# Patient Record
Sex: Male | Born: 1955
Health system: Southern US, Community
[De-identification: ages and names within clinical notes are randomized; demographics above are authoritative.]

## PROBLEM LIST (undated history)

## (undated) DIAGNOSIS — E876 Hypokalemia: Secondary | ICD-10-CM

## (undated) DIAGNOSIS — I509 Heart failure, unspecified: Secondary | ICD-10-CM

## (undated) DIAGNOSIS — E785 Hyperlipidemia, unspecified: Secondary | ICD-10-CM

## (undated) HISTORY — DX: Hypokalemia: E87.6

## (undated) HISTORY — DX: Hyperlipidemia, unspecified: E78.5

## (undated) HISTORY — DX: Heart failure, unspecified: I50.9

---

## 2014-08-10 ENCOUNTER — Encounter: Payer: Self-pay | Admitting: Gastroenterology

## 2015-07-17 DIAGNOSIS — I493 Ventricular premature depolarization: Secondary | ICD-10-CM | POA: Insufficient documentation

## 2016-01-16 DIAGNOSIS — I420 Dilated cardiomyopathy: Secondary | ICD-10-CM | POA: Diagnosis not present

## 2016-01-16 DIAGNOSIS — E785 Hyperlipidemia, unspecified: Secondary | ICD-10-CM | POA: Diagnosis not present

## 2016-01-16 DIAGNOSIS — I2510 Atherosclerotic heart disease of native coronary artery without angina pectoris: Secondary | ICD-10-CM | POA: Diagnosis not present

## 2016-01-16 DIAGNOSIS — I5022 Chronic systolic (congestive) heart failure: Secondary | ICD-10-CM | POA: Diagnosis not present

## 2016-01-24 DIAGNOSIS — Z7901 Long term (current) use of anticoagulants: Secondary | ICD-10-CM | POA: Diagnosis not present

## 2016-01-24 DIAGNOSIS — I4892 Unspecified atrial flutter: Secondary | ICD-10-CM | POA: Diagnosis not present

## 2016-01-24 DIAGNOSIS — I5022 Chronic systolic (congestive) heart failure: Secondary | ICD-10-CM | POA: Diagnosis not present

## 2016-02-15 DIAGNOSIS — E784 Other hyperlipidemia: Secondary | ICD-10-CM | POA: Diagnosis not present

## 2016-02-15 DIAGNOSIS — Z0000 Encounter for general adult medical examination without abnormal findings: Secondary | ICD-10-CM | POA: Diagnosis not present

## 2016-02-15 DIAGNOSIS — Z125 Encounter for screening for malignant neoplasm of prostate: Secondary | ICD-10-CM | POA: Diagnosis not present

## 2016-02-15 DIAGNOSIS — E669 Obesity, unspecified: Secondary | ICD-10-CM | POA: Diagnosis not present

## 2016-02-15 DIAGNOSIS — Z6830 Body mass index (BMI) 30.0-30.9, adult: Secondary | ICD-10-CM | POA: Diagnosis not present

## 2016-02-15 DIAGNOSIS — E119 Type 2 diabetes mellitus without complications: Secondary | ICD-10-CM | POA: Diagnosis not present

## 2016-02-22 DIAGNOSIS — I2510 Atherosclerotic heart disease of native coronary artery without angina pectoris: Secondary | ICD-10-CM | POA: Diagnosis not present

## 2016-05-22 DIAGNOSIS — E663 Overweight: Secondary | ICD-10-CM | POA: Diagnosis not present

## 2016-05-22 DIAGNOSIS — E1169 Type 2 diabetes mellitus with other specified complication: Secondary | ICD-10-CM | POA: Diagnosis not present

## 2016-05-22 DIAGNOSIS — E784 Other hyperlipidemia: Secondary | ICD-10-CM | POA: Diagnosis not present

## 2016-05-22 DIAGNOSIS — Z6829 Body mass index (BMI) 29.0-29.9, adult: Secondary | ICD-10-CM | POA: Diagnosis not present

## 2016-05-23 DIAGNOSIS — M79661 Pain in right lower leg: Secondary | ICD-10-CM | POA: Diagnosis not present

## 2016-05-23 DIAGNOSIS — R7989 Other specified abnormal findings of blood chemistry: Secondary | ICD-10-CM | POA: Diagnosis not present

## 2016-05-23 DIAGNOSIS — M7121 Synovial cyst of popliteal space [Baker], right knee: Secondary | ICD-10-CM | POA: Diagnosis not present

## 2016-05-23 DIAGNOSIS — M25474 Effusion, right foot: Secondary | ICD-10-CM | POA: Diagnosis not present

## 2016-05-23 DIAGNOSIS — R609 Edema, unspecified: Secondary | ICD-10-CM | POA: Diagnosis not present

## 2016-05-23 DIAGNOSIS — R600 Localized edema: Secondary | ICD-10-CM | POA: Diagnosis not present

## 2016-05-23 DIAGNOSIS — M7731 Calcaneal spur, right foot: Secondary | ICD-10-CM | POA: Diagnosis not present

## 2016-05-23 DIAGNOSIS — M7989 Other specified soft tissue disorders: Secondary | ICD-10-CM | POA: Diagnosis not present

## 2016-05-30 DIAGNOSIS — M109 Gout, unspecified: Secondary | ICD-10-CM | POA: Diagnosis not present

## 2016-06-13 DIAGNOSIS — M109 Gout, unspecified: Secondary | ICD-10-CM | POA: Diagnosis not present

## 2016-06-13 DIAGNOSIS — Z6829 Body mass index (BMI) 29.0-29.9, adult: Secondary | ICD-10-CM | POA: Diagnosis not present

## 2016-07-18 DIAGNOSIS — M109 Gout, unspecified: Secondary | ICD-10-CM | POA: Diagnosis not present

## 2016-07-18 DIAGNOSIS — Z6829 Body mass index (BMI) 29.0-29.9, adult: Secondary | ICD-10-CM | POA: Diagnosis not present

## 2016-11-27 DIAGNOSIS — I509 Heart failure, unspecified: Secondary | ICD-10-CM | POA: Diagnosis not present

## 2016-11-27 DIAGNOSIS — I2510 Atherosclerotic heart disease of native coronary artery without angina pectoris: Secondary | ICD-10-CM | POA: Diagnosis not present

## 2016-12-06 DIAGNOSIS — I25110 Atherosclerotic heart disease of native coronary artery with unstable angina pectoris: Secondary | ICD-10-CM | POA: Diagnosis not present

## 2016-12-06 DIAGNOSIS — I2510 Atherosclerotic heart disease of native coronary artery without angina pectoris: Secondary | ICD-10-CM | POA: Diagnosis not present

## 2016-12-06 DIAGNOSIS — E785 Hyperlipidemia, unspecified: Secondary | ICD-10-CM | POA: Diagnosis not present

## 2016-12-06 DIAGNOSIS — I5022 Chronic systolic (congestive) heart failure: Secondary | ICD-10-CM | POA: Diagnosis not present

## 2017-01-09 DIAGNOSIS — E119 Type 2 diabetes mellitus without complications: Secondary | ICD-10-CM | POA: Diagnosis not present

## 2017-01-09 DIAGNOSIS — J019 Acute sinusitis, unspecified: Secondary | ICD-10-CM | POA: Diagnosis not present

## 2017-01-09 DIAGNOSIS — E784 Other hyperlipidemia: Secondary | ICD-10-CM | POA: Diagnosis not present

## 2017-01-09 DIAGNOSIS — I509 Heart failure, unspecified: Secondary | ICD-10-CM | POA: Diagnosis not present

## 2017-01-09 DIAGNOSIS — I2510 Atherosclerotic heart disease of native coronary artery without angina pectoris: Secondary | ICD-10-CM | POA: Diagnosis not present

## 2017-01-09 DIAGNOSIS — E039 Hypothyroidism, unspecified: Secondary | ICD-10-CM | POA: Diagnosis not present

## 2017-01-09 DIAGNOSIS — M109 Gout, unspecified: Secondary | ICD-10-CM | POA: Diagnosis not present

## 2017-01-14 DIAGNOSIS — E876 Hypokalemia: Secondary | ICD-10-CM | POA: Diagnosis not present

## 2017-01-16 DIAGNOSIS — M79605 Pain in left leg: Secondary | ICD-10-CM | POA: Diagnosis not present

## 2017-01-16 DIAGNOSIS — M6281 Muscle weakness (generalized): Secondary | ICD-10-CM | POA: Diagnosis not present

## 2017-01-16 DIAGNOSIS — M545 Low back pain: Secondary | ICD-10-CM | POA: Diagnosis not present

## 2017-01-16 DIAGNOSIS — R2689 Other abnormalities of gait and mobility: Secondary | ICD-10-CM | POA: Diagnosis not present

## 2017-01-18 DIAGNOSIS — M79605 Pain in left leg: Secondary | ICD-10-CM | POA: Diagnosis not present

## 2017-01-18 DIAGNOSIS — M6281 Muscle weakness (generalized): Secondary | ICD-10-CM | POA: Diagnosis not present

## 2017-01-18 DIAGNOSIS — M545 Low back pain: Secondary | ICD-10-CM | POA: Diagnosis not present

## 2017-01-18 DIAGNOSIS — R2689 Other abnormalities of gait and mobility: Secondary | ICD-10-CM | POA: Diagnosis not present

## 2017-01-21 DIAGNOSIS — M6281 Muscle weakness (generalized): Secondary | ICD-10-CM | POA: Diagnosis not present

## 2017-01-21 DIAGNOSIS — R2689 Other abnormalities of gait and mobility: Secondary | ICD-10-CM | POA: Diagnosis not present

## 2017-01-21 DIAGNOSIS — M545 Low back pain: Secondary | ICD-10-CM | POA: Diagnosis not present

## 2017-01-21 DIAGNOSIS — M79605 Pain in left leg: Secondary | ICD-10-CM | POA: Diagnosis not present

## 2017-02-04 DIAGNOSIS — H5213 Myopia, bilateral: Secondary | ICD-10-CM | POA: Diagnosis not present

## 2017-02-04 DIAGNOSIS — E113313 Type 2 diabetes mellitus with moderate nonproliferative diabetic retinopathy with macular edema, bilateral: Secondary | ICD-10-CM | POA: Diagnosis not present

## 2017-02-28 DIAGNOSIS — E113493 Type 2 diabetes mellitus with severe nonproliferative diabetic retinopathy without macular edema, bilateral: Secondary | ICD-10-CM | POA: Diagnosis not present

## 2017-03-27 ENCOUNTER — Encounter: Payer: Self-pay | Admitting: Cardiology

## 2017-03-27 DIAGNOSIS — E785 Hyperlipidemia, unspecified: Secondary | ICD-10-CM | POA: Insufficient documentation

## 2017-03-27 DIAGNOSIS — I2510 Atherosclerotic heart disease of native coronary artery without angina pectoris: Secondary | ICD-10-CM | POA: Insufficient documentation

## 2017-03-27 DIAGNOSIS — I255 Ischemic cardiomyopathy: Secondary | ICD-10-CM | POA: Insufficient documentation

## 2017-03-27 DIAGNOSIS — I5022 Chronic systolic (congestive) heart failure: Secondary | ICD-10-CM | POA: Insufficient documentation

## 2017-03-27 DIAGNOSIS — I420 Dilated cardiomyopathy: Secondary | ICD-10-CM | POA: Insufficient documentation

## 2017-03-27 DIAGNOSIS — E119 Type 2 diabetes mellitus without complications: Secondary | ICD-10-CM | POA: Insufficient documentation

## 2017-03-29 ENCOUNTER — Ambulatory Visit (INDEPENDENT_AMBULATORY_CARE_PROVIDER_SITE_OTHER): Payer: BLUE CROSS/BLUE SHIELD | Admitting: Cardiology

## 2017-03-29 ENCOUNTER — Encounter: Payer: Self-pay | Admitting: Cardiology

## 2017-03-29 VITALS — BP 96/68 | HR 93 | Wt 204.8 lb

## 2017-03-29 DIAGNOSIS — I255 Ischemic cardiomyopathy: Secondary | ICD-10-CM

## 2017-03-29 DIAGNOSIS — I420 Dilated cardiomyopathy: Secondary | ICD-10-CM

## 2017-03-29 DIAGNOSIS — I5022 Chronic systolic (congestive) heart failure: Secondary | ICD-10-CM

## 2017-03-29 MED ORDER — NITROGLYCERIN 0.4 MG SL SUBL: 0.4000 mg | SUBLINGUAL_TABLET | SUBLINGUAL | 2 refills | Status: DC | PRN

## 2017-03-29 MED ORDER — PRAVASTATIN SODIUM 40 MG PO TABS: 40.0000 mg | ORAL_TABLET | Freq: Every day | ORAL | 11 refills | Status: DC

## 2017-03-29 MED ORDER — METOLAZONE 5 MG PO TABS: 5.0000 mg | ORAL_TABLET | Freq: Every day | ORAL | 5 refills | Status: DC

## 2017-03-29 NOTE — Progress Notes (Signed)
Cardiology Office Note:    Date:  03/29/2017   ID:  Cody Schwartz, DOB July 22, 1956, MRN [id number]  PCP:  [name]., MD  Cardiologist:  Shirlee More, MD    Referring MD: [name]., MD    ASSESSMENT:    1. Chronic systolic heart failure ([place])   2. Dilated cardiomyopathy ([place])   3. Ischemic cardiomyopathy    PLAN:    In order of problems listed above:  1. Markedly deteriorated he has anasarca with predominant right heart failure. On his own is increased his torsemide to twice daily and will increase his Zaroxolyn to 5 days per week. He said previously her hypokalemia and renal function reassessed today. I offered him IV diuretic and hospitalization he declines. His blood pressure is decreased he's been intolerant of beta blocker Ace R and will continue his distal diuretic and Ivabradine. Again we discussed the refractory nature of his heart failure advised him referral for advanced heart failure mortalities and he said he will consider reviewed me an answer at the next visit. He has refused EP consultation for ICD. 2. See above heart failure is worsened hemodynamics or worsen intolerant of conventional medications and I feel he should be referred for advanced heart failure modality 3. Continue dual and a platelet and statin for function lipids will be checked  Next appointment: One month   Medication Adjustments/Labs and Tests Ordered: Current medicines are reviewed at length with the patient today.  Concerns regarding medicines are outlined above.  No orders of the defined types were placed in this encounter.  No orders of the defined types were placed in this encounter.   Chief Complaint  Patient presents with  . Follow-up    Routine flup appt, pt does mention weight gain, and feeling "heaviness" in his abdomen    History of Present Illness:    Cody Schwartz is a 61 y.o. male with a hx of CAD with severe cardiomyopathy EF 20-25% 12/20/15, CHF, S/P PCI and DES of  LAD and POBA of D on 01/08/2012 and CTO of LAD withSuccessful PCI / Xience Drug Eluting Stent of the mid Right Coronary Artery on 10/12/15.He is intolerant of carvedilol and ACEI with weakness and hypotension, last seen March 2018. He is beginning to be displeased with quality of his life. He still works full-time as a Company secretary stroke  he finds it difficult to walk and is terribly bothered by his abdominal distention. He had no chest pain orthopnea palpitations syncope or TIA Compliance with diet, lifestyle and medications: Yes Past Medical History:  Diagnosis Date  . Hyperlipidemia   . Hypokalemia     History reviewed. No pertinent surgical history.  Current Medications: Current Meds  Medication Sig  . clopidogrel (PLAVIX) 75 MG tablet Take 75 mg by mouth daily.  [name] eplerenone (INSPRA) 25 MG tablet Take 25 mg by mouth daily.  . ivabradine (CORLANOR) 7.5 MG TABS tablet Take 7.5 mg by mouth 2 (two) times daily.  [name] levothyroxine (SYNTHROID, LEVOTHROID) 75 MCG tablet Take 75 mcg by mouth daily.  . metolazone (ZAROXOLYN) 5 MG tablet Take 5 mg by mouth daily. Takes one tablet on Tues, Thurs, Sat  . torsemide (DEMADEX) 20 MG tablet Take 20 mg by mouth 2 (two) times daily.     Allergies:   Patient has no known allergies.   Social History   Social History  . Marital status: Married    Spouse name: N/A  . Number of children: N/A  . Years  of education: N/A   Social History Main Topics  . Smoking status: Never Smoker  . Smokeless tobacco: Never Used  . Alcohol use None  . Drug use: Unknown  . Sexual activity: Not Asked   Other Topics Concern  . None   Social History Narrative  . None     Family History: The patient's family history includes CAD in his paternal grandfather and paternal grandmother; Stroke in his paternal grandfather. ROS:   Please see the history of present illness.    All other systems reviewed and are negative.  EKGs/Labs/Other Studies Reviewed:    The  following studies were reviewed today:   Recent Labs: Na 125, K 4.8 Cr 1.23 01/14/17 No results found for requested labs within last 8760 hours.  Recent Lipid Panel No results found for: CHOL, TRIG, HDL, CHOLHDL, VLDL, LDLCALC, LDLDIRECT  Physical Exam:    VS:  BP 96/68   Pulse 93   Wt 204 lb 12.8 oz (92.9 kg)   SpO2 100%     Wt Readings from Last 3 Encounters:  03/29/17 204 lb 12.8 oz (92.9 kg)     GEN: He looks chronically ill with muscle wasting and marked neck vein distention to the angle of the jaw sitting at 90 and a distended abdomen signs in no acute distress HEENT: Normal NECK: ; No carotid bruits LYMPHATICS: No lymphadenopathy CARDIAC:RRR, S3 is present rubs, gallops RESPIRATORY:  Clear to auscultation without rales, wheezing or rhonchi  ABDOMEN: Soft, non-tender, distended with hepatojugular reflux MUSCULOSKELETAL: He has anasarca above the umbilicus No deformity  SKIN: Warm and dry NEUROLOGIC:  Alert and oriented x 3 PSYCHIATRIC:  Normal affect    Signed, Shirlee More, MD  03/29/2017 11:57 AM    [place]

## 2017-03-29 NOTE — Patient Instructions (Addendum)
Medication Instructions:  Your physician recommends that you continue on your current medications as directed. Please refer to the Current Medication list given to you today.   Labwork: Your physician recommends that you return for lab work in: today. CMP, BNP, lipid   Testing/Procedures: None  Follow-Up: Your physician recommends that you schedule a follow-up appointment in: 1 month.   Any Other Special Instructions Will Be Listed Below (If Applicable).     If you need a refill on your cardiac medications before your next appointment, please call your pharmacy.    Heart Failure Heart failure is a condition in which the heart has trouble pumping blood because it has become weak or stiff. This means that the heart does not pump blood efficiently for the body to work well. For some people with heart failure, fluid may back up into the lungs and there may be swelling (edema) in the lower legs. Heart failure is usually a long-term (chronic) condition. It is important for you to take good care of yourself and follow the treatment plan from your health care provider. What are the causes? This condition is caused by some health problems, including:  High blood pressure (hypertension). Hypertension causes the heart muscle to work harder than normal. High blood pressure eventually causes the heart to become stiff and weak.  Coronary artery disease (CAD). CAD is the buildup of cholesterol and fat (plaques) in the arteries of the heart.  Heart attack (myocardial infarction). Injured tissue, which is caused by the heart attack, does not contract as well and the heart's ability to pump blood is weakened.  Abnormal heart valves. When the heart valves do not open and close properly, the heart muscle must pump harder to keep the blood flowing.  Heart muscle disease (cardiomyopathy or myocarditis). Heart muscle disease is damage to the heart muscle from a variety of causes, such as drug or  alcohol abuse, infections, or unknown causes. These can increase the risk of heart failure.  Lung disease. When the lungs do not work properly, the heart must work harder.  What increases the risk? Risk of heart failure increases as a person ages. This condition is also more likely to develop in people who:  Are overweight.  Are male.  Smoke or chew tobacco.  Abuse alcohol or illegal drugs.  Have taken medicines that can damage the heart, such as chemotherapy drugs.  Have diabetes. ? High blood sugar (glucose) is associated with high fat (lipid) levels in the blood. ? Diabetes can also damage tiny blood vessels that carry nutrients to the heart muscle.  Have abnormal heart rhythms.  Have thyroid problems.  Have low blood counts (anemia).  What are the signs or symptoms? Symptoms of this condition include:  Shortness of breath with activity, such as when climbing stairs.  Persistent cough.  Swelling of the feet, ankles, legs, or abdomen.  Unexplained weight gain.  Difficulty breathing when lying flat (orthopnea).  Waking from sleep because of the need to sit up and get more air.  Rapid heartbeat.  Fatigue and loss of energy.  Feeling light-headed, dizzy, or close to fainting.  Loss of appetite.  Nausea.  Increased urination during the night (nocturia).  Confusion.  How is this diagnosed? This condition is diagnosed based on:  Medical history, symptoms, and a physical exam.  Diagnostic tests, which may include: ? Echocardiogram. ? Electrocardiogram (ECG). ? Chest X-ray. ? Blood tests. ? Exercise stress test. ? Radionuclide scans. ? Cardiac catheterization and angiogram.  How is this treated? Treatment for this condition is aimed at managing the symptoms of heart failure. Medicines, behavioral changes, or other treatments may be necessary to treat heart failure. Medicines These may include:  Angiotensin-converting enzyme (ACE) inhibitors. This  type of medicine blocks the effects of a blood protein called angiotensin-converting enzyme. ACE inhibitors relax (dilate) the blood vessels and help to lower blood pressure.  Angiotensin receptor blockers (ARBs). This type of medicine blocks the actions of a blood protein called angiotensin. ARBs dilate the blood vessels and help to lower blood pressure.  Water pills (diuretics). Diuretics cause the kidneys to remove salt and water from the blood. The extra fluid is removed through urination, leaving a lower volume of blood that the heart has to pump.  Beta blockers. These improve heart muscle strength and they prevent the heart from beating too quickly.  Digoxin. This increases the force of the heartbeat.  Healthy behavior changes These may include:  Reaching and maintaining a healthy weight.  Stopping smoking or chewing tobacco.  Eating heart-healthy foods.  Limiting or avoiding alcohol.  Stopping use of street drugs (illegal drugs).  Physical activity.  Other treatments These may include:  Surgery to open blocked coronary arteries or repair damaged heart valves.  Placement of a biventricular pacemaker to improve heart muscle function (cardiac resynchronization therapy). This device paces both the right ventricle and left ventricle.  Placement of a device to treat serious abnormal heart rhythms (implantable cardioverter defibrillator, or ICD).  Placement of a device to improve the pumping ability of the heart (left ventricular assist device, or LVAD).  Heart transplant. This can cure heart failure, and it is considered for certain patients who do not improve with other therapies.  Follow these instructions at home: Medicines  Take over-the-counter and prescription medicines only as told by your health care provider. Medicines are important in reducing the workload of your heart, slowing the progression of heart failure, and improving your symptoms. ? Do not stop taking  your medicine unless your health care provider told you to do that. ? Do not skip any dose of medicine. ? Refill your prescriptions before you run out of medicine. You need your medicines every day. Eating and drinking   Eat heart-healthy foods. Talk with a dietitian to make an eating plan that is right for you. ? Choose foods that contain no trans fat and are low in saturated fat and cholesterol. Healthy choices include fresh or frozen fruits and vegetables, fish, lean meats, legumes, fat-free or low-fat dairy products, and whole-grain or high-fiber foods. ? Limit salt (sodium) if directed by your health care provider. Sodium restriction may reduce symptoms of heart failure. Ask a dietitian to recommend heart-healthy seasonings. ? Use healthy cooking methods instead of frying. Healthy methods include roasting, grilling, broiling, baking, poaching, steaming, and stir-frying.  Limit your fluid intake if directed by your health care provider. Fluid restriction may reduce symptoms of heart failure. Lifestyle  Stop smoking or using chewing tobacco. Nicotine and tobacco can damage your heart and your blood vessels. Do not use nicotine gum or patches before talking to your health care provider.  Limit alcohol intake to no more than 1 drink per day for non-pregnant women and 2 drinks per day for men. One drink equals 12 oz of beer, 5 oz of wine, or 1 oz of hard liquor. ? Drinking more than that is harmful to your heart. Tell your health care provider if you drink alcohol several times a week. ?  Talk with your health care provider about whether any level of alcohol use is safe for you. ? If your heart has already been damaged by alcohol or you have severe heart failure, drinking alcohol should be stopped completely.  Stop use of illegal drugs.  Lose weight if directed by your health care provider. Weight loss may reduce symptoms of heart failure.  Do moderate physical activity if directed by your  health care provider. People who are elderly and people with severe heart failure should consult with a health care provider for physical activity recommendations. Monitor important information  Weigh yourself every day. Keeping track of your weight daily helps you to notice excess fluid sooner. ? Weigh yourself every morning after you urinate and before you eat breakfast. ? Wear the same amount of clothing each time you weigh yourself. ? Record your daily weight. Provide your health care provider with your weight record.  Monitor and record your blood pressure as told by your health care provider.  Check your pulse as told by your health care provider. Dealing with extreme temperatures  If the weather is extremely hot: ? Avoid vigorous physical activity. ? Use air conditioning or fans or seek a cooler location. ? Avoid caffeine and alcohol. ? Wear loose-fitting, lightweight, and light-colored clothing.  If the weather is extremely cold: ? Avoid vigorous physical activity. ? Layer your clothes. ? Wear mittens or gloves, a hat, and a scarf when you go outside. ? Avoid alcohol. General instructions  Manage other health conditions such as hypertension, diabetes, thyroid disease, or abnormal heart rhythms as told by your health care provider.  Learn to manage stress. If you need help to do this, ask your health care provider.  Plan rest periods when fatigued.  Get ongoing education and support as needed.  Participate in or seek rehabilitation as needed to maintain or improve independence and quality of life.  Stay up to date with immunizations. Keeping current on pneumococcal and influenza immunizations is especially important to prevent respiratory infections.  Keep all follow-up visits as told by your health care provider. This is important. Contact a health care provider if:  You have a rapid weight gain.  You have increasing shortness of breath that is unusual for  you.  You are unable to participate in your usual physical activities.  You tire easily.  You cough more than normal, especially with physical activity.  You have any swelling or more swelling in areas such as your hands, feet, ankles, or abdomen.  You are unable to sleep because it is hard to breathe.  You feel like your heart is beating quickly (palpitations).  You become dizzy or light-headed when you stand up. Get help right away if:  You have difficulty breathing.  You notice or your family notices a change in your awareness, such as having trouble staying awake or having difficulty with concentration.  You have pain or discomfort in your chest.  You have an episode of fainting (syncope). This information is not intended to replace advice given to you by your health care provider. Make sure you discuss any questions you have with your health care provider. Document Released: 09/10/2005 Document Revised: 05/15/2016 Document Reviewed: 04/04/2016 Elsevier Interactive Patient Education  2017 [employer].

## 2017-03-30 LAB — COMPREHENSIVE METABOLIC PANEL
ALBUMIN: 4.4 g/dL (ref 3.6–4.8)
ALT: 9 IU/L (ref 0–44)
AST: 21 IU/L (ref 0–40)
Albumin/Globulin Ratio: 1.4 (ref 1.2–2.2)
Alkaline Phosphatase: 170 IU/L — ABNORMAL HIGH (ref 39–117)
BUN/Creatinine Ratio: 40 — ABNORMAL HIGH (ref 10–24)
BUN: 51 mg/dL — ABNORMAL HIGH (ref 8–27)
Bilirubin Total: 1 mg/dL (ref 0.0–1.2)
CALCIUM: 9.4 mg/dL (ref 8.6–10.2)
CO2: 22 mmol/L (ref 20–29)
Chloride: 94 mmol/L — ABNORMAL LOW (ref 96–106)
Creatinine, Ser: 1.26 mg/dL (ref 0.76–1.27)
GFR calc Af Amer: 70 mL/min/{1.73_m2} (ref >59)
GFR calc non Af Amer: 60 mL/min/{1.73_m2} (ref >59)
GLOBULIN, TOTAL: 3.1 g/dL (ref 1.5–4.5)
Glucose: 125 mg/dL — ABNORMAL HIGH (ref 65–99)
Potassium: 3.6 mmol/L (ref 3.5–5.2)
SODIUM: 136 mmol/L (ref 134–144)
Total Protein: 7.5 g/dL (ref 6.0–8.5)

## 2017-03-30 LAB — LIPID PANEL
CHOL/HDL RATIO: 2.5 ratio (ref 0.0–5.0)
Cholesterol, Total: 118 mg/dL (ref 100–199)
HDL: 47 mg/dL (ref >39)
LDL CALC: 58 mg/dL (ref 0–99)
TRIGLYCERIDES: 63 mg/dL (ref 0–149)
VLDL Cholesterol Cal: 13 mg/dL (ref 5–40)

## 2017-04-03 LAB — BRAIN NATRIURETIC PEPTIDE

## 2017-04-10 DIAGNOSIS — E784 Other hyperlipidemia: Secondary | ICD-10-CM | POA: Diagnosis not present

## 2017-04-10 DIAGNOSIS — E039 Hypothyroidism, unspecified: Secondary | ICD-10-CM | POA: Diagnosis not present

## 2017-04-10 DIAGNOSIS — R140 Abdominal distension (gaseous): Secondary | ICD-10-CM | POA: Diagnosis not present

## 2017-04-10 DIAGNOSIS — E139 Other specified diabetes mellitus without complications: Secondary | ICD-10-CM | POA: Diagnosis not present

## 2017-04-10 DIAGNOSIS — I509 Heart failure, unspecified: Secondary | ICD-10-CM | POA: Diagnosis not present

## 2017-04-10 DIAGNOSIS — J019 Acute sinusitis, unspecified: Secondary | ICD-10-CM | POA: Diagnosis not present

## 2017-04-18 DIAGNOSIS — K8020 Calculus of gallbladder without cholecystitis without obstruction: Secondary | ICD-10-CM | POA: Diagnosis not present

## 2017-04-18 DIAGNOSIS — R188 Other ascites: Secondary | ICD-10-CM | POA: Diagnosis not present

## 2017-04-18 DIAGNOSIS — K7689 Other specified diseases of liver: Secondary | ICD-10-CM | POA: Diagnosis not present

## 2017-04-29 DIAGNOSIS — R188 Other ascites: Secondary | ICD-10-CM | POA: Diagnosis not present

## 2017-05-02 ENCOUNTER — Ambulatory Visit (INDEPENDENT_AMBULATORY_CARE_PROVIDER_SITE_OTHER): Payer: BLUE CROSS/BLUE SHIELD | Admitting: Cardiology

## 2017-05-02 ENCOUNTER — Encounter: Payer: Self-pay | Admitting: Cardiology

## 2017-05-02 VITALS — BP 74/48 | HR 79 | Wt 182.4 lb

## 2017-05-02 DIAGNOSIS — I5022 Chronic systolic (congestive) heart failure: Secondary | ICD-10-CM | POA: Diagnosis not present

## 2017-05-02 DIAGNOSIS — R601 Generalized edema: Secondary | ICD-10-CM

## 2017-05-02 NOTE — Patient Instructions (Addendum)
Medication Instructions:  Your physician has recommended you make the following change in your medication:  CHANGE lisinopril 2.5 mg three times weekly. Monday, Wednesday, Friday.   Labwork: None  Testing/Procedures: None  Follow-Up: Your physician recommends that you schedule a follow-up appointment in: 3 months.   Any Other Special Instructions Will Be Listed Below (If Applicable).     If you need a refill on your cardiac medications before your next appointment, please call your pharmacy.    Heart Failure  Weigh yourself every morning when you first wake up and record on a calender or note pad, bring this to your office visits. Using a pill tender can help with taking your medications consistently.  Limit your fluid intake to 2 liters daily  Limit your sodium intake to less than 2-3 grams daily. Ask if you need dietary teaching.  If you gain more than 3 pounds (from your dry weight ), double your dose of diuretic for the day.  If you gain more than 5 pounds (from your dry weight), double your dose of lasix and call your heart failure doctor.  Please do not smoke tobacco since it is very bad for your heart.  Please do not drink alcohol since it can worsen your heart failure.Also avoid OTC nonsteroidal drugs, such as advil, aleve and motrin.  Try to exercise for at least 30 minutes every day because this will help your heart be more efficient. You may be eligible for supervised cardiac rehab, ask your physician.

## 2017-05-02 NOTE — Progress Notes (Signed)
Cardiology Office Note:    Date:  05/02/2017   ID:  Cody Schwartz, DOB 1956-05-04, MRN [id number]  PCP:  [name]., MD Please do a BMP monthly Cardiologist:  Shirlee More, MD    Referring MD: [name]., MD    ASSESSMENT:    1. Chronic systolic heart failure ([facility])    PLAN:    In order of problems listed above:  1. He is improved but remains fluid overloaded will continue his intense combination diuretic regimen with metolazone torsemide and Inspra. He is intolerant of beta blockers continue his current trauma therapy with ivabradine and a low-dose blood pressure is low at target keep him on a minimum dose of ACE inhibitor recently started by his PCP. I advised him to have monthly BMP done and again asked him to consider referral for advanced heart failure mortalities or ICD. 2. CAD stable continue dual antiplatelet therapy and his statin   Next appointment: 3 months   Medication Adjustments/Labs and Tests Ordered: Current medicines are reviewed at length with the patient today.  Concerns regarding medicines are outlined above.  No orders of the defined types were placed in this encounter.  No orders of the defined types were placed in this encounter.   Chief Complaint  Patient presents with  . Follow-up    1 month flup     History of Present Illness:    Cody Schwartz is a 61 y.o. male with a hx of severe cardiomyopathy EF 20-25% 12/20/15, CHF, S/P PCI and DES of LAD and POBA of D on 01/08/2012 and CTO of LAD withSuccessful PCI / Xience Drug Eluting Stent of the mid Right Coronary Artery on 10/12/15.He was intolerant of carvedilol and ACEI with weakness and hypotension, and stopped spironolactone because of urticaria. He is advised and declined an ICD. He was last seen 1 month ago.  He feels markedly improved with combination of intense diuretic therapy and a therapeutic paracentesis he is now down 34 pounds. Home blood pressure runs 0539 systolic he is does not  feel lightheaded no shortness of breath chest pain palpitation or syncope and he has a mild degree of peripheral edema. He decided not to pursue advanced heart failure consultation and again declines consideration of ICD. He relates lab work 1 week ago with his PCP and I've asked for a copy and advised him to have a BMP done monthly. He had a paracentesis and weight is now down 34 lbs. Compliance with diet, lifestyle and medications: Yes Past Medical History:  Diagnosis Date  . Hyperlipidemia   . Hypokalemia     History reviewed. No pertinent surgical history.  Current Medications: Current Meds  Medication Sig  . allopurinol (ZYLOPRIM) 100 MG tablet Take 100 mg by mouth daily.  [name] allopurinol (ZYLOPRIM) 300 MG tablet Take 300 mg by mouth daily.  [name] aspirin EC 81 MG tablet Take 81 mg by mouth daily.  . clopidogrel (PLAVIX) 75 MG tablet Take 75 mg by mouth daily.  [name] eplerenone (INSPRA) 25 MG tablet Take 25 mg by mouth daily.  . ivabradine (CORLANOR) 7.5 MG TABS tablet Take 7.5 mg by mouth 2 (two) times daily.  [name] levothyroxine (SYNTHROID, LEVOTHROID) 88 MCG tablet Take 88 mcg by mouth daily before breakfast.  . lisinopril (PRINIVIL,ZESTRIL) 5 MG tablet Take 2.5 mg by mouth 3 (three) times a week. On Mon Weds Fri  . metolazone (ZAROXOLYN) 5 MG tablet Take 1 tablet (5 mg total) by mouth daily. Except Weds and Sun  .  nitroGLYCERIN (NITROSTAT) 0.4 MG SL tablet Place 1 tablet (0.4 mg total) under the tongue every 5 (five) minutes as needed for chest pain.  [name] Potassium Chloride 40 MEQ/15ML (20%) SOLN DILLUTE 15 MILLILITERS IN FOUR OUNCES OF COLD WATER THREE TIMES DAILY  . pravastatin (PRAVACHOL) 40 MG tablet Take 1 tablet (40 mg total) by mouth daily.  [name] torsemide (DEMADEX) 20 MG tablet Take 20 mg by mouth 2 (two) times daily.     Allergies:   Patient has no known allergies.   Social History   Social History  . Marital status: Married    Spouse name: N/A  . Number of children: N/A  . Years  of education: N/A   Social History Main Topics  . Smoking status: Never Smoker  . Smokeless tobacco: Never Used  . Alcohol use None  . Drug use: Unknown  . Sexual activity: Not Asked   Other Topics Concern  . None   Social History Narrative  . None     Family History: The patient's family history includes CAD in his paternal grandfather and paternal grandmother; Stroke in his paternal grandfather. ROS:   Please see the history of present illness.    All other systems reviewed and are negative.  EKGs/Labs/Other Studies Reviewed:    The following studies were reviewed today:  Recent Labs: 03/29/2017: ALT 9; BNP CANCELED; BUN 51; Creatinine, Ser 1.26; Potassium 3.6; Sodium 136  Recent Lipid Panel    Component Value Date/Time   CHOL 118 03/29/2017 1231   TRIG 63 03/29/2017 1231   HDL 47 03/29/2017 1231   CHOLHDL 2.5 03/29/2017 1231   LDLCALC 58 03/29/2017 1231    Physical Exam:    VS:  BP (!) 74/48 (BP Location: Right Arm, Patient Position: Sitting)   Pulse 79   Wt 182 lb 6.4 oz (82.7 kg)   SpO2 98%     Wt Readings from Last 3 Encounters:  05/02/17 182 lb 6.4 oz (82.7 kg)  03/29/17 204 lb 12.8 oz (92.9 kg)     GEN:  Well nourished, well developed in no acute distress HEENT: Normal NECK: Mild JVD; No carotid bruits LYMPHATICS: No lymphadenopathy CARDIAC 2-3+ edemaRRR, 3/6 TR murmur, rubs, gallops RESPIRATORY:  Clear to auscultation without rales, wheezing or rhonchi  ABDOMEN: Soft, non-tender, non-distended MUSCULOSKELETAL:  No edema; No deformity  SKIN: Warm and dry NEUROLOGIC:  Alert and oriented x 3 PSYCHIATRIC:  Normal affect    Signed, Shirlee More, MD  05/02/2017 11:25 AM     [facility]

## 2017-05-03 ENCOUNTER — Other Ambulatory Visit: Payer: Self-pay

## 2017-05-03 MED ORDER — TORSEMIDE 20 MG PO TABS: 20.0000 mg | ORAL_TABLET | Freq: Two times a day (BID) | ORAL | 11 refills | Status: DC

## 2017-05-06 ENCOUNTER — Other Ambulatory Visit: Payer: Self-pay

## 2017-05-06 MED ORDER — POTASSIUM CHLORIDE 40 MEQ/15ML (20%) PO SOLN: 15.0000 mL | Freq: Three times a day (TID) | ORAL | 1 refills | Status: DC

## 2017-06-20 ENCOUNTER — Telehealth: Payer: Self-pay | Admitting: Cardiology

## 2017-06-20 ENCOUNTER — Other Ambulatory Visit: Payer: Self-pay

## 2017-06-20 MED ORDER — POTASSIUM CHLORIDE 40 MEQ/15ML (20%) PO SOLN: 15.0000 mL | Freq: Three times a day (TID) | ORAL | 3 refills | Status: DC

## 2017-06-20 NOTE — Telephone Encounter (Signed)
Spoke with pt and Rx was sent to [facility] per pts request.  Pt verbalized understanding.

## 2017-06-20 NOTE — Telephone Encounter (Signed)
Patient is taking liquid potassium and he needs some called to [facility].. Please call patient

## 2017-08-01 DIAGNOSIS — E785 Hyperlipidemia, unspecified: Secondary | ICD-10-CM | POA: Diagnosis not present

## 2017-08-01 DIAGNOSIS — E039 Hypothyroidism, unspecified: Secondary | ICD-10-CM | POA: Diagnosis not present

## 2017-08-01 DIAGNOSIS — E119 Type 2 diabetes mellitus without complications: Secondary | ICD-10-CM | POA: Diagnosis not present

## 2017-08-01 DIAGNOSIS — R188 Other ascites: Secondary | ICD-10-CM | POA: Diagnosis not present

## 2017-08-01 DIAGNOSIS — I25119 Atherosclerotic heart disease of native coronary artery with unspecified angina pectoris: Secondary | ICD-10-CM | POA: Diagnosis not present

## 2017-08-01 DIAGNOSIS — M109 Gout, unspecified: Secondary | ICD-10-CM | POA: Diagnosis not present

## 2017-08-02 ENCOUNTER — Encounter: Payer: Self-pay | Admitting: Cardiology

## 2017-08-02 ENCOUNTER — Ambulatory Visit: Payer: BLUE CROSS/BLUE SHIELD | Admitting: Cardiology

## 2017-08-02 VITALS — BP 90/78 | HR 98 | Ht 68.0 in | Wt 209.0 lb

## 2017-08-02 DIAGNOSIS — I493 Ventricular premature depolarization: Secondary | ICD-10-CM

## 2017-08-02 DIAGNOSIS — I5022 Chronic systolic (congestive) heart failure: Secondary | ICD-10-CM | POA: Diagnosis not present

## 2017-08-02 DIAGNOSIS — E785 Hyperlipidemia, unspecified: Secondary | ICD-10-CM

## 2017-08-02 DIAGNOSIS — I2510 Atherosclerotic heart disease of native coronary artery without angina pectoris: Secondary | ICD-10-CM | POA: Diagnosis not present

## 2017-08-02 MED ORDER — EPLERENONE 25 MG PO TABS: 25.0000 mg | ORAL_TABLET | Freq: Every day | ORAL | 2 refills | Status: DC

## 2017-08-02 NOTE — Patient Instructions (Signed)
Medication Instructions:  Your physician has recommended you make the following change in your medication:  RESTART inspira 25 mg daily  Labwork: None  Testing/Procedures: None  Follow-Up: Your physician recommends that you schedule a follow-up appointment in: 1 month.  You will receive a phone call to schedule with the heart failure clinic.  Any Other Special Instructions Will Be Listed Below (If Applicable).     If you need a refill on your cardiac medications before your next appointment, please call your pharmacy.

## 2017-08-02 NOTE — Progress Notes (Signed)
Cardiology Office Note:    Date:  08/02/2017   ID:  Cody Schwartz, DOB 11/15/1955, MRN [id number]  PCP:  [name]., MD  Cardiologist:  Shirlee More, MD    Referring MD: [name]., MD    ASSESSMENT:    1. Chronic systolic heart failure ([place])   2. Coronary artery disease involving native coronary artery of native heart without angina pectoris   3. PVC (premature ventricular contraction)   4. Hyperlipidemia, unspecified hyperlipidemia type    PLAN:    In order of problems listed above:  1. He has obviously deteriorated he has a marked weight gain in the range of 30 pounds anasarca intense ascites.  Despite his oxygen saturation being diminished is not particularly short of breath has marked predominant right heart failure.  I offered him hospitalization for diuretic therapy he prefers to start with paracentesis that was remarkably effective in the past but will have him resume his distal diuretic along with his loop.  His wife was present we had a very frank discussion of his deterioration refractory nature of his heart failure and his poor prognosis.  He agrees to undergo advanced heart failure evaluation and we referred to Dr. Tempie Hoist.  Potential treatments would include referral to palliative care, or transplantation, biventricular assist device or even interventional techniques for severe tricuspid regurgitation. 2. Stable likely will need both left and right heart catheterization as part of heart failure evaluation 3. At risk for sudden cardiac death with severe LV dysfunction and will need consideration of EP consult and ICD 4. Stable continue statin   Next appointment: 4 weeks   Medication Adjustments/Labs and Tests Ordered: Current medicines are reviewed at length with the patient today.  Concerns regarding medicines are outlined above.  Orders Placed This Encounter  Procedures  . AMB referral to CHF clinic   Meds ordered this encounter  Medications  .  eplerenone (INSPRA) 25 MG tablet    Sig: Take 1 tablet (25 mg total) daily by mouth.    Dispense:  30 tablet    Refill:  2    Chief Complaint  Patient presents with  . Follow-up  . Shortness of Breath    with activity, normal for him    History of Present Illness:    Cody Schwartz is a 61 y.o. male with a hx of severe cardiomyopathy EF 20-25% 12/20/15, CHF, S/P PCI and DES of LAD and POBA of D on 01/08/2012 and CTO of LAD withSuccessful PCI / Xience Drug Eluting Stent of the mid Right Coronary Artery on 10/12/15.He was intolerant of carvedilol and ACEI with weakness and hypotension, and stopped spironolactone because of urticaria. He is advised and declined an ICD.and advanced heart failure evaluation  last seen 3 months ago. Compliance with diet, lifestyle and medications: Yes Recently has had a marked accumulation of right heart failure he has tense ascites and plans on having paracentesis at the beginning of the week.  He has not tolerated beta-blockers ACE or arms with hypotension but I did restart distal diuretic.  After frank discussion with the patient is wife he agrees to advance heart failure evaluation. Past Medical History:  Diagnosis Date  . Hyperlipidemia   . Hypokalemia     No past surgical history on file.  Current Medications: Current Meds  Medication Sig  . allopurinol (ZYLOPRIM) 100 MG tablet Take 100 mg by mouth daily.  [name] allopurinol (ZYLOPRIM) 300 MG tablet Take 300 mg by mouth daily.  [name] aspirin EC  81 MG tablet Take 81 mg by mouth daily.  . clopidogrel (PLAVIX) 75 MG tablet Take 75 mg by mouth daily.  . ivabradine (CORLANOR) 7.5 MG TABS tablet Take 7.5 mg by mouth 2 (two) times daily.  [name] levothyroxine (SYNTHROID, LEVOTHROID) 88 MCG tablet Take 88 mcg by mouth daily before breakfast.  . lisinopril (PRINIVIL,ZESTRIL) 5 MG tablet Take 2.5 mg by mouth 3 (three) times a week. On Mon Weds Fri  . metolazone (ZAROXOLYN) 5 MG tablet Take 1 tablet (5 mg total) by  mouth daily. Except Weds and Sun  . nitroGLYCERIN (NITROSTAT) 0.4 MG SL tablet Place 1 tablet (0.4 mg total) under the tongue every 5 (five) minutes as needed for chest pain.  [name] Potassium Chloride 40 MEQ/15ML (20%) SOLN Take 15 mLs by mouth 3 (three) times daily.  . pravastatin (PRAVACHOL) 40 MG tablet Take 1 tablet (40 mg total) by mouth daily.  [name] torsemide (DEMADEX) 20 MG tablet Take 1 tablet (20 mg total) by mouth 2 (two) times daily.  . Vitamin D, Ergocalciferol, (DRISDOL) 50000 units CAPS capsule Take 50,000 Units every 7 (seven) days by mouth.     Allergies:   Spironolactone   Social History   Socioeconomic History  . Marital status: Married    Spouse name: Not on file  . Number of children: Not on file  . Years of education: Not on file  . Highest education level: Not on file  Social Needs  . Financial resource strain: Not on file  . Food insecurity - worry: Not on file  . Food insecurity - inability: Not on file  . Transportation needs - medical: Not on file  . Transportation needs - non-medical: Not on file  Occupational History  . Not on file  Tobacco Use  . Smoking status: Never Smoker  . Smokeless tobacco: Never Used  Substance and Sexual Activity  . Alcohol use: Not on file  . Drug use: Not on file  . Sexual activity: Not on file  Other Topics Concern  . Not on file  Social History Narrative  . Not on file     Family History: The patient's family history includes CAD in his paternal grandfather and paternal grandmother; Stroke in his paternal grandfather. ROS:   Please see the history of present illness.    All other systems reviewed and are negative.  EKGs/Labs/Other Studies Reviewed:    The following studies were reviewed today:   Recent Labs: 03/29/2017: ALT 9; BNP CANCELED; BUN 51; Creatinine, Ser 1.26; Potassium 3.6; Sodium 136  Recent Lipid Panel    Component Value Date/Time   CHOL 118 03/29/2017 1231   TRIG 63 03/29/2017 1231   HDL 47  03/29/2017 1231   CHOLHDL 2.5 03/29/2017 1231   LDLCALC 58 03/29/2017 1231    Physical Exam:    VS:  BP 90/78 (BP Location: Left Arm, Patient Position: Sitting)   Pulse 98   Ht 5\' 8"  (1.727 m)   Wt 209 lb (94.8 kg)   SpO2 (!) 87%   BMI 31.78 kg/m     Wt Readings from Last 3 Encounters:  08/02/17 209 lb (94.8 kg)  05/02/17 182 lb 6.4 oz (82.7 kg)  03/29/17 204 lb 12.8 oz (92.9 kg)     GEN: He appears quite chronically ill he has anasarca and prominent tense ascites despite an oxygen sat of 87% he is in no acute distress HEENT: Normal NECK: Severe pulsatile JVD; No carotid bruits LYMPHATICS: No lymphadenopathy CARDIAC: Regular rhythm  paradoxical second heart sound third heart sound is present has a prominent murmur of tricuspid regurgitation RRR, RESPIRATORY:  Clear to auscultation without rales, wheezing or rhonchi  ABDOMEN: Tense ascites MUSCULOSKELETAL: Anasarca with greater than 4+ edema above his umbilicus SKIN: Warm and dry NEUROLOGIC:  Alert and oriented x 3 PSYCHIATRIC:  Normal affect    Signed, Shirlee More, MD  08/02/2017 12:51 PM    [facility]

## 2017-08-06 DIAGNOSIS — R188 Other ascites: Secondary | ICD-10-CM | POA: Diagnosis not present

## 2017-08-22 DIAGNOSIS — E113493 Type 2 diabetes mellitus with severe nonproliferative diabetic retinopathy without macular edema, bilateral: Secondary | ICD-10-CM | POA: Diagnosis not present

## 2017-09-02 ENCOUNTER — Ambulatory Visit: Payer: BLUE CROSS/BLUE SHIELD | Admitting: Cardiology

## 2017-09-13 DIAGNOSIS — R188 Other ascites: Secondary | ICD-10-CM | POA: Diagnosis not present

## 2017-09-25 NOTE — Progress Notes (Signed)
Cardiology Office Note:    Date:  09/26/2017   ID:  Cody Schwartz, DOB 1955/12/02, MRN [id number]  PCP:  [name]., MD  Cardiologist:  Shirlee More, MD    Referring MD: [name]., MD    ASSESSMENT:    1. Chronic systolic heart failure (HCC)    PLAN:    In order of problems listed above:  1. Will attempt to optimize medical therapy transition from low-dose ACE to ARNI and if tolerated up titration.  He is made a decision not to pursue advanced heart failure or ICD treatment.  He will continue his current medical regimen with diuretics MRA transition from ACE to ARNI and ivabradine.  He is beta-blocker intolerant   Next appointment: 4 weeks   Medication Adjustments/Labs and Tests Ordered: Current medicines are reviewed at length with the patient today.  Concerns regarding medicines are outlined above.  No orders of the defined types were placed in this encounter.  Meds ordered this encounter  Medications  . DISCONTD: sacubitril-valsartan (ENTRESTO) 24-26 MG    Sig: Take 1 tablet by mouth daily.    Dispense:  12 tablet    Refill:  0  . sacubitril-valsartan (ENTRESTO) 24-26 MG    Sig: Take 1 tablet by mouth daily.    Dispense:  45 tablet    Refill:  3    Chief Complaint  Patient presents with  . Follow-up  . Congestive Heart Failure    History of Present Illness:    Cody Schwartz is a 62 y.o. male with a hx of severe cardiomyopathy EF 20-25% 12/20/15, CHF, S/P PCI and DES of LAD and POBA of D on 01/08/2012 and CTO of LAD withSuccessful PCI / Xience Drug Eluting Stent of the mid Right Coronary Artery on 10/12/15.He was intolerant of carvedilol and ACEI with weakness and hypotension, and stopped spironolactone because of urticaria. He is advised and declined an ICD.and advanced heart failure evaluation  last seen  last seen in November 2018. With marked decompensated right heart failure. Compliance with diet, lifestyle and medications: Yes If anything he feels  improved still works full-time but in the interim he has had a paracentesis done with 10 L removed for recurrent ascites.  He has chronic edema but he is not short of breath no orthopnea PND. Past Medical History:  Diagnosis Date  . Hyperlipidemia   . Hypokalemia     No past surgical history on file.  Current Medications: Current Meds  Medication Sig  . allopurinol (ZYLOPRIM) 100 MG tablet Take 100 mg by mouth daily.  [name] allopurinol (ZYLOPRIM) 300 MG tablet Take 300 mg by mouth daily.  [name] aspirin EC 81 MG tablet Take 81 mg by mouth daily.  . clopidogrel (PLAVIX) 75 MG tablet Take 75 mg by mouth daily.  [name] eplerenone (INSPRA) 25 MG tablet Take 1 tablet (25 mg total) daily by mouth.  . ivabradine (CORLANOR) 7.5 MG TABS tablet Take 7.5 mg by mouth 2 (two) times daily.  [name] levothyroxine (SYNTHROID, LEVOTHROID) 88 MCG tablet Take 88 mcg by mouth daily before breakfast.  . metolazone (ZAROXOLYN) 5 MG tablet Take 1 tablet (5 mg total) by mouth daily. Except Weds and Sun  . nitroGLYCERIN (NITROSTAT) 0.4 MG SL tablet Place 1 tablet (0.4 mg total) under the tongue every 5 (five) minutes as needed for chest pain.  [name] Potassium Chloride 40 MEQ/15ML (20%) SOLN Take 15 mLs by mouth 3 (three) times daily.  . pravastatin (PRAVACHOL) 40 MG tablet Take  1 tablet (40 mg total) by mouth daily.  [name] torsemide (DEMADEX) 20 MG tablet Take 1 tablet (20 mg total) by mouth 2 (two) times daily.  . Vitamin D, Ergocalciferol, (DRISDOL) 50000 units CAPS capsule Take 50,000 Units every 7 (seven) days by mouth.  . [DISCONTINUED] lisinopril (PRINIVIL,ZESTRIL) 5 MG tablet Take 2.5 mg by mouth 3 (three) times a week. On Mon Weds Fri     Allergies:   Spironolactone   Social History   Socioeconomic History  . Marital status: Married    Spouse name: None  . Number of children: None  . Years of education: None  . Highest education level: None  Social Needs  . Financial resource strain: None  . Food insecurity - worry: None   . Food insecurity - inability: None  . Transportation needs - medical: None  . Transportation needs - non-medical: None  Occupational History  . None  Tobacco Use  . Smoking status: Never Smoker  . Smokeless tobacco: Never Used  Substance and Sexual Activity  . Alcohol use: None  . Drug use: None  . Sexual activity: None  Other Topics Concern  . None  Social History Narrative  . None     Family History: The patient's family history includes CAD in his paternal grandfather and paternal grandmother; Stroke in his paternal grandfather. ROS:   Please see the history of present illness.    All other systems reviewed and are negative.  EKGs/Labs/Other Studies Reviewed:    The following studies were reviewed today:  Recent Labs: 03/29/2017: ALT 9; BNP CANCELED; BUN 51; Creatinine, Ser 1.26; Potassium 3.6; Sodium 136  Recent Lipid Panel    Component Value Date/Time   CHOL 118 03/29/2017 1231   TRIG 63 03/29/2017 1231   HDL 47 03/29/2017 1231   CHOLHDL 2.5 03/29/2017 1231   LDLCALC 58 03/29/2017 1231    Physical Exam:    VS:  BP 98/60 (BP Location: Right Arm, Patient Position: Sitting, Cuff Size: Normal)   Pulse 92   Ht 5\' 8"  (1.727 m)   Wt 202 lb (91.6 kg)   SpO2 99%   BMI 30.71 kg/m     Wt Readings from Last 3 Encounters:  09/26/17 202 lb (91.6 kg)  08/02/17 209 lb (94.8 kg)  05/02/17 182 lb 6.4 oz (82.7 kg)     GEN: He appears chronically ill is lost a great deal of his muscle he has moderate neck vein distention  in no acute distress HEENT: Normal NECK: No JVD; No carotid bruits LYMPHATICS: No lymphadenopathy CARDIAC: Soft variable first heart sound RRR, no murmurs, rubs, gallops RESPIRATORY:  Clear to auscultation without rales, wheezing or rhonchi  ABDOMEN: Soft, non-tender, non-distended MUSCULOSKELETAL: There is moderate ascites in 2-3+ edema to the knees edema; No deformity  SKIN: Warm and dry NEUROLOGIC:  Alert and oriented x 3 PSYCHIATRIC:  Normal  affect    Signed, Shirlee More, MD  09/26/2017 1:01 PM    [facility]

## 2017-09-26 ENCOUNTER — Ambulatory Visit: Payer: BLUE CROSS/BLUE SHIELD | Admitting: Cardiology

## 2017-09-26 ENCOUNTER — Encounter: Payer: Self-pay | Admitting: Cardiology

## 2017-09-26 VITALS — BP 98/60 | HR 92 | Ht 68.0 in | Wt 202.0 lb

## 2017-09-26 DIAGNOSIS — I5022 Chronic systolic (congestive) heart failure: Secondary | ICD-10-CM

## 2017-09-26 MED ORDER — SACUBITRIL-VALSARTAN 24-26 MG PO TABS: 1.0000 | ORAL_TABLET | Freq: Every day | ORAL | 3 refills | Status: DC

## 2017-09-26 MED ORDER — SACUBITRIL-VALSARTAN 24-26 MG PO TABS: 1.0000 | ORAL_TABLET | Freq: Every day | ORAL | 0 refills | Status: DC

## 2017-09-26 NOTE — Patient Instructions (Addendum)
Medication Instructions:  Your physician has recommended you make the following change in your medication:  STOP lisinopril after tomorrow  START Entresto 24-26 mg once daily, three times weekly. Start 10-01-17  Labwork: None  Testing/Procedures: None  Follow-Up: Your physician recommends that you schedule a follow-up appointment in: 1 month.  Any Other Special Instructions Will Be Listed Below (If Applicable).     If you need a refill on your cardiac medications before your next appointment, please call your pharmacy.

## 2017-10-25 NOTE — Progress Notes (Signed)
Cardiology Office Note:    Date:  10/28/2017   ID:  Cody Schwartz, DOB [birth year], MRN [id number]  PCP:  [name]., MD  Cardiologist:  Shirlee More, MD    Referring MD: [name]., MD    ASSESSMENT:    1. Chronic systolic heart failure (HCC)    PLAN:    In order of problems listed above:  1. He is worsened weight is up more than 20 pounds and has marked right heart failure with ascites.  He will likely require paracentesis in the next few weeks for symptomatic relief.  He is tolerating the low-dose Entresto wants to continue the same check proBNP is a biochemical marker of heart failure.  Again I advise referral to advanced heart failure and he declines.  Continue his other treatment including diuretics and ivabradine.  He will also have a BMP done today   Next appointment: 6 weeks   Medication Adjustments/Labs and Tests Ordered: Current medicines are reviewed at length with the patient today.  Concerns regarding medicines are outlined above.  Orders Placed This Encounter  Procedures  . Basic metabolic panel  . Pro b natriuretic peptide (BNP)   No orders of the defined types were placed in this encounter.   Chief Complaint  Patient presents with  . Congestive Heart Failure    History of Present Illness:    Cody Schwartz is a 62 y.o. male with a hx of severe cardiomyopathy EF 20-25% 12/20/15, CHF, S/P PCI and DES of LAD and POBA of D on 01/08/2012 and CTO of LAD withSuccessful PCI / Xience Drug Eluting Stent of the mid Right Coronary Artery on 10/12/15.He was intolerant of carvedilol and ACEI with weakness and hypotension, and stopped spironolactone because of urticaria. He is advised and declined an ICD.and advanced heart failure evaluation last seen one month ago and initiated on low dose ARNI.[name] Compliance with diet, lifestyle and medications: Yes His weight is up greater than 20 pounds and he has increased abdominal distention.  He is short of breath walking  in and out of the office but no orthopnea PND chest pain palpitation or syncope.  He has been taken minimal dose of ARNI for 3 weeks does not notice an improvement.  Today he is noticing diarrhea and vomited in the office he thinks is related to something he ate at a [employer] event last evening Past Medical History:  Diagnosis Date  . Hyperlipidemia   . Hypokalemia     History reviewed. No pertinent surgical history.  Current Medications: Current Meds  Medication Sig  . allopurinol (ZYLOPRIM) 100 MG tablet Take 100 mg by mouth daily.  [name] allopurinol (ZYLOPRIM) 300 MG tablet Take 300 mg by mouth daily.  [name] aspirin EC 81 MG tablet Take 81 mg by mouth daily.  . clopidogrel (PLAVIX) 75 MG tablet Take 75 mg by mouth daily.  [name] eplerenone (INSPRA) 25 MG tablet Take 1 tablet (25 mg total) daily by mouth.  . ivabradine (CORLANOR) 7.5 MG TABS tablet Take 7.5 mg by mouth 2 (two) times daily.  [name] levothyroxine (SYNTHROID, LEVOTHROID) 88 MCG tablet Take 88 mcg by mouth daily before breakfast.  . metolazone (ZAROXOLYN) 5 MG tablet Take 1 tablet (5 mg total) by mouth daily. Except Weds and Sun  . nitroGLYCERIN (NITROSTAT) 0.4 MG SL tablet Place 1 tablet (0.4 mg total) under the tongue every 5 (five) minutes as needed for chest pain.  [name] Potassium Chloride 40 MEQ/15ML (20%) SOLN Take 15 mLs by  mouth 3 (three) times daily.  . pravastatin (PRAVACHOL) 40 MG tablet Take 1 tablet (40 mg total) by mouth daily.  . sacubitril-valsartan (ENTRESTO) 24-26 MG Take 1 tablet by mouth daily. (Patient taking differently: Take 1 tablet by mouth daily. Takes on [place], and Sat)  . torsemide (DEMADEX) 20 MG tablet Take 1 tablet (20 mg total) by mouth 2 (two) times daily.  . Vitamin D, Ergocalciferol, (DRISDOL) 50000 units CAPS capsule Take 50,000 Units every 7 (seven) days by mouth.     Allergies:   Spironolactone   Social History   Socioeconomic History  . Marital status: Married    Spouse name: None  . Number  of children: None  . Years of education: None  . Highest education level: None  Social Needs  . Financial resource strain: None  . Food insecurity - worry: None  . Food insecurity - inability: None  . Transportation needs - medical: None  . Transportation needs - non-medical: None  Occupational History  . None  Tobacco Use  . Smoking status: Never Smoker  . Smokeless tobacco: Never Used  Substance and Sexual Activity  . Alcohol use: None  . Drug use: None  . Sexual activity: None  Other Topics Concern  . None  Social History Narrative  . None     Family History: The patient's family history includes CAD in his paternal grandfather and paternal grandmother; Stroke in his paternal grandfather. ROS:   Please see the history of present illness.    All other systems reviewed and are negative.  EKGs/Labs/Other Studies Reviewed:    The following studies were reviewed today:   Recent Labs: 03/29/2017: ALT 9; BNP CANCELED; BUN 51; Creatinine, Ser 1.26; Potassium 3.6; Sodium 136  Recent Lipid Panel    Component Value Date/Time   CHOL 118 03/29/2017 1231   TRIG 63 03/29/2017 1231   HDL 47 03/29/2017 1231   CHOLHDL 2.5 03/29/2017 1231   LDLCALC 58 03/29/2017 1231    Physical Exam:    VS:  BP (!) 92/58 (BP Location: Right Arm, Patient Position: Sitting, Cuff Size: Normal)   Pulse 90   Ht 5\' 8"  (1.727 m)   Wt 226 lb 6.4 oz (102.7 kg)   SpO2 99%   BMI 34.42 kg/m     Wt Readings from Last 3 Encounters:  10/28/17 226 lb 6.4 oz (102.7 kg)  09/26/17 202 lb (91.6 kg)  08/02/17 209 lb (94.8 kg)     GEN: He looks quite chronically ill muscle wasting and marked ascites he walks very slowly with his hand and his wife shoulder in the hallway in no acute distress HEENT: Normal NECK: Marked JVD; No carotid bruits LYMPHATICS: No lymphadenopathy CARDIAC: Regular rhythm soft S1 positive S3 RESPIRATORY:  Clear to auscultation without rales, wheezing or rhonchi  ABDOMEN: Soft,  non-tender, non-distended MUSCULOSKELETAL: He has anasarca with edema above the umbilicus what appears to be tense ascites delivered there is pulsatile we also check BMP today edema; No deformity  SKIN: Warm and dry NEUROLOGIC:  Alert and oriented x 3 PSYCHIATRIC:  Normal affect    Signed, Shirlee More, MD  10/28/2017 1:03 PM    [facility]

## 2017-10-28 ENCOUNTER — Encounter: Payer: Self-pay | Admitting: Cardiology

## 2017-10-28 ENCOUNTER — Ambulatory Visit: Payer: BLUE CROSS/BLUE SHIELD | Admitting: Cardiology

## 2017-10-28 VITALS — BP 92/58 | HR 90 | Ht 68.0 in | Wt 226.4 lb

## 2017-10-28 DIAGNOSIS — I5022 Chronic systolic (congestive) heart failure: Secondary | ICD-10-CM

## 2017-10-28 NOTE — Patient Instructions (Signed)
Medication Instructions:  Your physician recommends that you continue on your current medications as directed. Please refer to the Current Medication list given to you today.  Labwork: Your physician recommends that you return for lab work in: today. BMP, BNP  Testing/Procedures: None  Follow-Up: Your physician recommends that you schedule a follow-up appointment in: 6 weeks.  Any Other Special Instructions Will Be Listed Below (If Applicable).     If you need a refill on your cardiac medications before your next appointment, please call your pharmacy.   

## 2017-10-29 ENCOUNTER — Telehealth: Payer: Self-pay

## 2017-10-29 DIAGNOSIS — I25119 Atherosclerotic heart disease of native coronary artery with unspecified angina pectoris: Secondary | ICD-10-CM

## 2017-10-29 DIAGNOSIS — I5022 Chronic systolic (congestive) heart failure: Secondary | ICD-10-CM

## 2017-10-29 LAB — BASIC METABOLIC PANEL
BUN / CREAT RATIO: 45 — AB (ref 10–24)
BUN: 102 mg/dL (ref 8–27)
CHLORIDE: 102 mmol/L (ref 96–106)
CO2: 14 mmol/L — ABNORMAL LOW (ref 20–29)
Calcium: 8.9 mg/dL (ref 8.6–10.2)
Creatinine, Ser: 2.28 mg/dL — ABNORMAL HIGH (ref 0.76–1.27)
GFR, EST AFRICAN AMERICAN: 34 mL/min/{1.73_m2} — AB (ref >59)
GFR, EST NON AFRICAN AMERICAN: 29 mL/min/{1.73_m2} — AB (ref >59)
Glucose: 92 mg/dL (ref 65–99)
POTASSIUM: 3.9 mmol/L (ref 3.5–5.2)
SODIUM: 133 mmol/L — AB (ref 134–144)

## 2017-10-29 LAB — PRO B NATRIURETIC PEPTIDE: NT-Pro BNP: 15013 pg/mL — ABNORMAL HIGH (ref 0–210)

## 2017-10-29 NOTE — Telephone Encounter (Signed)
Patient informed of results. Advised patient to stop Entresto and metolazone. Advised patient to have lab work rechecked at Dr. Viviann Spare office on Thursday. Patient verbalized understanding. Patient would like to think about seeing Dr. Missy Sabins and get back to [place].  Will fax current results to Dr. Micheal Likens as well as an order for lab work Thursday.

## 2017-11-04 DIAGNOSIS — E559 Vitamin D deficiency, unspecified: Secondary | ICD-10-CM | POA: Diagnosis not present

## 2017-11-04 DIAGNOSIS — E785 Hyperlipidemia, unspecified: Secondary | ICD-10-CM | POA: Diagnosis not present

## 2017-11-04 DIAGNOSIS — Z79899 Other long term (current) drug therapy: Secondary | ICD-10-CM | POA: Diagnosis not present

## 2017-11-04 DIAGNOSIS — R188 Other ascites: Secondary | ICD-10-CM | POA: Diagnosis not present

## 2017-11-04 DIAGNOSIS — I509 Heart failure, unspecified: Secondary | ICD-10-CM | POA: Diagnosis not present

## 2017-11-04 DIAGNOSIS — E119 Type 2 diabetes mellitus without complications: Secondary | ICD-10-CM | POA: Diagnosis not present

## 2017-11-04 DIAGNOSIS — I25119 Atherosclerotic heart disease of native coronary artery with unspecified angina pectoris: Secondary | ICD-10-CM | POA: Diagnosis not present

## 2017-11-04 DIAGNOSIS — E669 Obesity, unspecified: Secondary | ICD-10-CM | POA: Diagnosis not present

## 2017-11-04 DIAGNOSIS — R0602 Shortness of breath: Secondary | ICD-10-CM | POA: Diagnosis not present

## 2017-11-05 ENCOUNTER — Telehealth: Payer: Self-pay

## 2017-11-05 DIAGNOSIS — I5022 Chronic systolic (congestive) heart failure: Secondary | ICD-10-CM

## 2017-11-05 NOTE — Addendum Note (Signed)
Addended by: Jossie Ng on: 11/05/2017 04:26 PM   Modules accepted: Orders

## 2017-11-05 NOTE — Telephone Encounter (Signed)
Left message to return call regarding lab results received from Dr. Micheal Likens. Lab work is still abnormal. Patient needs to stay off Entresto. Patient should see Dr. Jeffie Pollock per Dr. Bettina Gavia. Will advised patient of this when he returns call.

## 2017-11-05 NOTE — Telephone Encounter (Signed)
Patient returned call. Advised not to restart Entresto. Patient agreed to referral to CHF clinic. Referral made, advised patient he would receive a phone call to schedule. Patient verbalized understanding. No further questions.

## 2017-11-13 ENCOUNTER — Telehealth: Payer: Self-pay | Admitting: Cardiology

## 2017-11-13 NOTE — Telephone Encounter (Signed)
error 

## 2017-11-14 DIAGNOSIS — R188 Other ascites: Secondary | ICD-10-CM | POA: Diagnosis not present

## 2017-11-18 ENCOUNTER — Telehealth: Payer: Self-pay | Admitting: Cardiology

## 2017-11-18 NOTE — Telephone Encounter (Signed)
About his referral to Dr Micael Hampshire paper work is mixed up and he wanted to inform you about it

## 2017-11-18 NOTE — Telephone Encounter (Signed)
Patient states that the [facility] needs some paperwork faxed. Contacted the [facility] and verified that they do have all of the information needed. Patient will be called for an appointment.

## 2017-12-08 DIAGNOSIS — I50814 Right heart failure due to left heart failure: Secondary | ICD-10-CM | POA: Insufficient documentation

## 2017-12-08 DIAGNOSIS — N189 Chronic kidney disease, unspecified: Secondary | ICD-10-CM | POA: Insufficient documentation

## 2017-12-08 DIAGNOSIS — R188 Other ascites: Secondary | ICD-10-CM | POA: Insufficient documentation

## 2017-12-08 NOTE — Progress Notes (Signed)
Cardiology Office Note:    Date:  12/08/2017   ID:  Cody Schwartz, DOB 04/13/1956, MRN [id number]  PCP:  [name]., MD  Cardiologist:  Shirlee More, MD    Referring MD: [name]., MD    ASSESSMENT:    1. Chronic systolic heart failure ([name])   2. Coronary artery disease involving native coronary artery of native heart without angina pectoris   3. Chronic kidney disease, unspecified CKD stage   4. Other ascites   5. Right heart failure (secondary to left heart failure) (HCC)    PLAN:    In order of problems listed above:  1. Improved he has finally agreed and is scheduled to be seen in advanced heart failure program.  I will cautiously reintroduce a minimal dose of an ACE inhibitor he tolerated the past and continue his current diuretic.  I asked him in 1 week to have a renal function and proBNP checked with Dr. Micheal Likens.  Hopefully he is a candidate for advanced heart failure modalities as he is progressing to stage D refractory heart failure 2. Stable continue current medical treatment 3. Recently worsened recheck renal function 4. Stable after repeated paracentesis 5. Worsened he is requiring paracentesis every 2 months for palliation.   Next appointment: One month   Medication Adjustments/Labs and Tests Ordered: Current medicines are reviewed at length with the patient today.  Concerns regarding medicines are outlined above.  No orders of the defined types were placed in this encounter.  No orders of the defined types were placed in this encounter.   No chief complaint on file.   History of Present Illness:    Cody Schwartz is a 62 y.o. male with a hx of severe cardiomyopathy EF 20-25% 12/20/15, CHF, S/P PCI and DES of LAD and POBA of D on 01/08/2012 and CTO of LAD withSuccessful PCI / Xience Drug Eluting Stent of the mid Right Coronary Artery on 10/12/15.He was intolerant of carvedilol and ACEI with weakness and hypotension, and stopped spironolactone because  of urticaria. He is advised and declined an ICD.and advanced heart failure evaluation and has needed repeated paracentesis for symptomatic tense ascites last seen 10/28/17. He had a marked decrease in renal function, Cr 1.26-2.28 and entresto in a minimal dose and metalazone were both stopped.  ASSESSMENT:    10/28/17   1. Chronic systolic heart failure (HCC)    PLAN:    1. He is worsened weight is up more than 20 pounds and has marked right heart failure with ascites.  He will likely require paracentesis in the next few weeks for symptomatic relief.  He is tolerating the low-dose Entresto wants to continue the same check proBNP is a biochemical marker of heart failure.  Again I advise referral to advanced heart failure and he declines.  Continue his other treatment including diuretics and ivabradine.  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  Presently he is comfortable after recent paracentesis of 10 L.  No angina syncope orthopnea he has chronic edema and exertional shortness of breath more than ADLs and usual activities.  He has finally agreed and has been referred for advanced heart failure evaluation.  Past Medical History:  Diagnosis Date  . Hyperlipidemia   . Hypokalemia     No past surgical history on file.  Current Medications: No outpatient medications have  been marked as taking for the 12/09/17 encounter (Appointment) with [name], MD.     Allergies:   Spironolactone   Social History   Socioeconomic History  . Marital status: Married    Spouse name: Not on file  . Number of children: Not on file  . Years of education: Not on file  . Highest education level: Not on file  Social Needs  . Financial resource strain: Not on file  . Food insecurity - worry: Not on file  . Food insecurity - inability: Not  on file  . Transportation needs - medical: Not on file  . Transportation needs - non-medical: Not on file  Occupational History  . Not on file  Tobacco Use  . Smoking status: Never Smoker  . Smokeless tobacco: Never Used  Substance and Sexual Activity  . Alcohol use: Not on file  . Drug use: Not on file  . Sexual activity: Not on file  Other Topics Concern  . Not on file  Social History Narrative  . Not on file     Family History: The patient's family history includes CAD in his paternal grandfather and paternal grandmother; Stroke in his paternal grandfather. ROS:   Please see the history of present illness.    All other systems reviewed and are negative.  EKGs/Labs/Other Studies Reviewed:    The following studies were reviewed today:    Recent Labs:   11/04/17 Cr 1.80, Na 129 K 4.1 03/29/2017: ALT 9; BNP CANCELED 10/28/2017: BUN 102; Creatinine, Ser 2.28; NT-Pro BNP 15,013; Potassium 3.9; Sodium 133  Recent Lipid Panel    Component Value Date/Time   CHOL 118 03/29/2017 1231   TRIG 63 03/29/2017 1231   HDL 47 03/29/2017 1231   CHOLHDL 2.5 03/29/2017 1231   LDLCALC 58 03/29/2017 1231    Physical Exam:    VS:  There were no vitals taken for this visit.    Wt Readings from Last 3 Encounters:  10/28/17 226 lb 6.4 oz (102.7 kg)  09/26/17 202 lb (91.6 kg)  08/02/17 209 lb (94.8 kg)     GEN: He appears chronically ill S3 poor muscle mass and has ascites. [place] no acute distress HEENT: Normal NECK: No JVD; No carotid bruits LYMPHATICS: No lymphadenopathy CARDIAC: Pulsatile neck veins RRR, S3 RESPIRATORY:  Clear to auscultation without rales, wheezing or rhonchi  ABDOMEN: Soft, non-tender, non-distended MUSCULOSKELETAL: 2-3+ lower extremity bilateral pitting edema; No deformity  SKIN: Warm and dry NEUROLOGIC:  Alert and oriented x 3 PSYCHIATRIC:  Normal affect    Signed, Shirlee More, MD  12/08/2017 6:18 PM    [facility]

## 2017-12-09 ENCOUNTER — Ambulatory Visit: Payer: BLUE CROSS/BLUE SHIELD | Admitting: Cardiology

## 2017-12-09 ENCOUNTER — Encounter: Payer: Self-pay | Admitting: Cardiology

## 2017-12-09 VITALS — BP 96/72 | HR 95 | Ht 68.0 in | Wt 208.4 lb

## 2017-12-09 DIAGNOSIS — I5022 Chronic systolic (congestive) heart failure: Secondary | ICD-10-CM

## 2017-12-09 DIAGNOSIS — I2510 Atherosclerotic heart disease of native coronary artery without angina pectoris: Secondary | ICD-10-CM

## 2017-12-09 DIAGNOSIS — I50814 Right heart failure due to left heart failure: Secondary | ICD-10-CM | POA: Diagnosis not present

## 2017-12-09 DIAGNOSIS — R188 Other ascites: Secondary | ICD-10-CM

## 2017-12-09 DIAGNOSIS — N189 Chronic kidney disease, unspecified: Secondary | ICD-10-CM

## 2017-12-09 MED ORDER — LISINOPRIL 2.5 MG PO TABS: 1.2500 mg | ORAL_TABLET | Freq: Every day | ORAL | 3 refills | Status: DC

## 2017-12-09 MED ORDER — LISINOPRIL 2.5 MG PO TABS: 1.2500 mg | ORAL_TABLET | ORAL | 3 refills | Status: DC

## 2017-12-09 NOTE — Patient Instructions (Signed)
Medication Instructions:  Your physician has recommended you make the following change in your medication:  START lisinopril 1.25 mg (0.5 tablet) three times weekly  Labwork: Your physician recommends that you return for lab work in: 1 week at Dr. Viviann Spare office. BMP, BNP  Testing/Procedures: None  Follow-Up: Your physician recommends that you schedule a follow-up appointment in: 4 weeks.  Any Other Special Instructions Will Be Listed Below (If Applicable).     If you need a refill on your cardiac medications before your next appointment, please call your pharmacy.

## 2017-12-10 NOTE — Progress Notes (Signed)
Nira Conn - can we get him in ASAP? Thanks -dan

## 2017-12-16 DIAGNOSIS — I2510 Atherosclerotic heart disease of native coronary artery without angina pectoris: Secondary | ICD-10-CM | POA: Diagnosis not present

## 2017-12-16 DIAGNOSIS — I5022 Chronic systolic (congestive) heart failure: Secondary | ICD-10-CM | POA: Diagnosis not present

## 2017-12-20 ENCOUNTER — Telehealth (HOSPITAL_COMMUNITY): Payer: Self-pay | Admitting: Vascular Surgery

## 2017-12-20 DIAGNOSIS — R6889 Other general symptoms and signs: Secondary | ICD-10-CM | POA: Diagnosis not present

## 2017-12-20 DIAGNOSIS — J9801 Acute bronchospasm: Secondary | ICD-10-CM | POA: Diagnosis not present

## 2017-12-20 DIAGNOSIS — J101 Influenza due to other identified influenza virus with other respiratory manifestations: Secondary | ICD-10-CM | POA: Diagnosis not present

## 2017-12-20 DIAGNOSIS — J189 Pneumonia, unspecified organism: Secondary | ICD-10-CM | POA: Diagnosis not present

## 2017-12-20 NOTE — Telephone Encounter (Signed)
Left pt message to move up appt fopr April 17th to 4/2 @ 3. Asked pt to call back to confirm new appt date and time

## 2017-12-24 ENCOUNTER — Encounter (HOSPITAL_COMMUNITY): Payer: BLUE CROSS/BLUE SHIELD | Admitting: Internal Medicine

## 2018-01-02 ENCOUNTER — Encounter (HOSPITAL_COMMUNITY): Payer: Self-pay | Admitting: *Deleted

## 2018-01-02 ENCOUNTER — Encounter (HOSPITAL_COMMUNITY): Payer: Self-pay | Admitting: Internal Medicine

## 2018-01-02 ENCOUNTER — Ambulatory Visit (HOSPITAL_COMMUNITY)
Admission: RE | Admit: 2018-01-02 | Discharge: 2018-01-02 | Disposition: A | Discharge status: Home / Self Care | Payer: BLUE CROSS/BLUE SHIELD | Source: Ambulatory Visit | Attending: Internal Medicine | Admitting: Internal Medicine

## 2018-01-02 VITALS — BP 92/60 | HR 79 | Wt 193.0 lb

## 2018-01-02 DIAGNOSIS — N189 Chronic kidney disease, unspecified: Secondary | ICD-10-CM | POA: Diagnosis not present

## 2018-01-02 DIAGNOSIS — I5022 Chronic systolic (congestive) heart failure: Secondary | ICD-10-CM | POA: Insufficient documentation

## 2018-01-02 DIAGNOSIS — I50810 Right heart failure, unspecified: Secondary | ICD-10-CM | POA: Diagnosis not present

## 2018-01-02 DIAGNOSIS — I272 Pulmonary hypertension, unspecified: Secondary | ICD-10-CM | POA: Diagnosis not present

## 2018-01-02 DIAGNOSIS — I420 Dilated cardiomyopathy: Secondary | ICD-10-CM

## 2018-01-02 DIAGNOSIS — Z7902 Long term (current) use of antithrombotics/antiplatelets: Secondary | ICD-10-CM | POA: Insufficient documentation

## 2018-01-02 DIAGNOSIS — Z7982 Long term (current) use of aspirin: Secondary | ICD-10-CM | POA: Insufficient documentation

## 2018-01-02 DIAGNOSIS — E785 Hyperlipidemia, unspecified: Secondary | ICD-10-CM | POA: Insufficient documentation

## 2018-01-02 DIAGNOSIS — E876 Hypokalemia: Secondary | ICD-10-CM | POA: Insufficient documentation

## 2018-01-02 DIAGNOSIS — I4891 Unspecified atrial fibrillation: Secondary | ICD-10-CM | POA: Diagnosis not present

## 2018-01-02 DIAGNOSIS — I130 Hypertensive heart and chronic kidney disease with heart failure and stage 1 through stage 4 chronic kidney disease, or unspecified chronic kidney disease: Secondary | ICD-10-CM | POA: Diagnosis not present

## 2018-01-02 DIAGNOSIS — I255 Ischemic cardiomyopathy: Secondary | ICD-10-CM | POA: Diagnosis not present

## 2018-01-02 DIAGNOSIS — I480 Paroxysmal atrial fibrillation: Secondary | ICD-10-CM | POA: Diagnosis not present

## 2018-01-02 DIAGNOSIS — Z79890 Hormone replacement therapy (postmenopausal): Secondary | ICD-10-CM | POA: Insufficient documentation

## 2018-01-02 DIAGNOSIS — Z79899 Other long term (current) drug therapy: Secondary | ICD-10-CM | POA: Insufficient documentation

## 2018-01-02 DIAGNOSIS — I2510 Atherosclerotic heart disease of native coronary artery without angina pectoris: Secondary | ICD-10-CM | POA: Insufficient documentation

## 2018-01-02 DIAGNOSIS — R188 Other ascites: Secondary | ICD-10-CM | POA: Diagnosis not present

## 2018-01-02 LAB — COMPREHENSIVE METABOLIC PANEL
ALK PHOS: 117 U/L (ref 38–126)
ALT: 12 U/L — ABNORMAL LOW (ref 17–63)
AST: 25 U/L (ref 15–41)
Albumin: 3.5 g/dL (ref 3.5–5.0)
Anion gap: 13 (ref 5–15)
BILIRUBIN TOTAL: 1 mg/dL (ref 0.3–1.2)
BUN: 28 mg/dL — ABNORMAL HIGH (ref 6–20)
CALCIUM: 8.7 mg/dL — AB (ref 8.9–10.3)
CO2: 21 mmol/L — ABNORMAL LOW (ref 22–32)
Chloride: 94 mmol/L — ABNORMAL LOW (ref 101–111)
Creatinine, Ser: 1.44 mg/dL — ABNORMAL HIGH (ref 0.61–1.24)
GFR calc Af Amer: 58 mL/min — ABNORMAL LOW (ref >60)
GFR, EST NON AFRICAN AMERICAN: 50 mL/min — AB (ref >60)
Glucose, Bld: 124 mg/dL — ABNORMAL HIGH (ref 65–99)
Potassium: 3.6 mmol/L (ref 3.5–5.1)
Sodium: 128 mmol/L — ABNORMAL LOW (ref 135–145)
TOTAL PROTEIN: 6.7 g/dL (ref 6.5–8.1)

## 2018-01-02 LAB — TSH: TSH: 6.365 u[IU]/mL — AB (ref 0.350–4.500)

## 2018-01-02 LAB — SEDIMENTATION RATE: Sed Rate: 4 mm/hr (ref 0–16)

## 2018-01-02 LAB — CBC
HCT: 38.7 % — ABNORMAL LOW (ref 39.0–52.0)
HEMOGLOBIN: 13 g/dL (ref 13.0–17.0)
MCH: 32.4 pg (ref 26.0–34.0)
MCHC: 33.6 g/dL (ref 30.0–36.0)
MCV: 96.5 fL (ref 78.0–100.0)
Platelets: 220 10*3/uL (ref 150–400)
RBC: 4.01 MIL/uL — ABNORMAL LOW (ref 4.22–5.81)
RDW: 14.4 % (ref 11.5–15.5)
WBC: 8.6 10*3/uL (ref 4.0–10.5)

## 2018-01-02 LAB — PROTIME-INR
INR: 1.17
PROTHROMBIN TIME: 14.8 s (ref 11.4–15.2)

## 2018-01-02 MED ORDER — TORSEMIDE 20 MG PO TABS: ORAL_TABLET | ORAL | 11 refills | Status: DC

## 2018-01-02 MED ORDER — MIDODRINE HCL 5 MG PO TABS: 5.0000 mg | ORAL_TABLET | Freq: Three times a day (TID) | ORAL | 3 refills | Status: DC

## 2018-01-02 MED ORDER — APIXABAN 5 MG PO TABS: 5.0000 mg | ORAL_TABLET | Freq: Two times a day (BID) | ORAL | 3 refills | Status: DC

## 2018-01-02 NOTE — Progress Notes (Signed)
Advanced Heart Failure Clinic Consult Note   Referring Physician: Dr Bettina Gavia PCP: [name]., MD PCP-Cardiologist: Dr Bettina Gavia  HPI: Cody Schwartz is a 62 y.o. male with chronic systolic failure due to ICM, CAD s/p PCI (most recent 09/2015), CKD, ascites requiring paracentesis, moderate pulmonary HTN referred by Dr. Bettina Gavia for further evaluation of HF and pulmonary HTN<.  He last saw Dr Bettina Gavia on 12/09/17. He has had progressive worsening of symptoms with weight gain and abdominal distension. Has required paracentesis with high-volume taps. Says taps are now becoming more frequent. He has been intolerant of BB and spiro due to low BP. He is on lose dose ACE-I.   He presents today as a new patient. Overall, doing well. He has had problems with abdominal distension for about 1 year that had been responsive to torsemide. Now has to have paracentesis q2 months for the last 6 months. Works as a Theme park manager. Denies any history of having an MI, ETOH or tobacco use. Says CAD was found incidentally. SOB with walking about 1/4 mile or less feels like it may be getting worse. Denies orthopnea/PND/peripheral edema. BP always low. His abdomen is very distended, comes on slowly. No fever, chills, or cough. No dizziness. Energy level ok. Good appetite. Urinates a lot with torsemide. Weights: 177-193 lbs, fluctuating depending on paracentesis. Today he is 181 lbs. Compliant with medications. Limits fluid and salt.   Lives with his wife. No tobacco, ETOH, or drug use. Works full time as a Theme park manager.  Echo 09/2015: (not available in echo system to review) Severely reduced overall LV systolic function. Estimated LV ejection fraction 25% There is global hypokinesis with akinesis of the anteroseptum, distal anterior wall, and apex. Indeterminate LV diastolic function. Mild global RV hypokinesis. Biatrial dilation. Mild mitral regurgitation Mild tricuspid regurgitation  Echo 11/2016: EF 20-25% (per Dr Oren Binet  note)  [place] 10/12/2015: 2 vessel CAD - LAD occluded at old stent site, de novo RCA lesion Successful PCI / Xience Drug Eluting Stent of the mid Right Coronary Artery. Successful PCI / PTCA of the distal Left Anterior Descending Coronary Artery.  [place] 10/12/2015: Hemodynamics (mmHg) RA mean 24 RV 59/17 PA 62/34 PCWP 33 AO 113 Cardiac Output (Thermo) 2.98 Cardiac Index (Thermo) 1.4   Review of Systems: [y] = yes, _0  = no   General: Weight gain Blue.Reese ]; Weight loss _1 ; Anorexia _2 ; Fatigue _3 ; Fever _4 ; Chills _5 ; Weakness _6   Cardiac: Chest pain/pressure _7 ; Resting SOB _8 ; Exertional SOB _9 ; Orthopnea _10 ; Pedal Edema _11 ; Palpitations _12 ; Syncope _13 ; Presyncope _14 ; Paroxysmal nocturnal dyspnea_15   Pulmonary: Cough _16 ; Wheezing_17 ; Hemoptysis_18 ; Sputum _19 ; Snoring _20   GI: Vomiting_21 ; Dysphagia_22 ; Melena_23 ; Hematochezia _24 ; Heartburn_25 ; Abdominal pain _26 ; Constipation _27 ; Diarrhea _28 ; BRBPR _29   GU: Hematuria_30 ; Dysuria _31 ; Nocturia_32   Vascular: Pain in legs with walking _33 ; Pain in feet with lying flat _34 ; Non-healing sores _35 ; Stroke _36 ; TIA _37 ; Slurred speech _38 ;  Neuro: Headaches_39 ; Vertigo_40 ; Seizures_41 ; Paresthesias_42 ;Blurred vision _43 ; Diplopia _44 ; Vision changes _45   Ortho/Skin: Arthritis _46 ; Joint pain _47 ; Muscle pain _48 ; Joint swelling _49 ; Back Pain _50 ; Rash _51   Psych: Depression_52 ; Anxiety_53   Heme: Bleeding problems _54 ; Clotting disorders _55 ; Anemia _56   Endocrine: Diabetes _0 ; Thyroid dysfunction_1    Past Medical History:  Diagnosis Date  . Hyperlipidemia   . Hypokalemia     Current Outpatient Medications  Medication Sig Dispense Refill  . allopurinol (ZYLOPRIM) 100 MG tablet Take 100 mg by mouth daily.  12  . allopurinol (ZYLOPRIM) 300 MG tablet Take 300 mg by mouth daily.  12  . aspirin EC 81 MG tablet Take 81 mg by mouth daily.    . clopidogrel (PLAVIX) 75 MG tablet Take 75 mg by mouth daily.    [name] eplerenone  (INSPRA) 25 MG tablet Take 1 tablet (25 mg total) daily by mouth. 30 tablet 2  . ivabradine (CORLANOR) 7.5 MG TABS tablet Take 7.5 mg by mouth 2 (two) times daily.    [name] levothyroxine (SYNTHROID, LEVOTHROID) 125 MCG tablet TAKE ONE TABLET BY MOUTH DAILY (INCREASED DUE TO RECENT LAB RESULTS)  3  . lisinopril (PRINIVIL,ZESTRIL) 2.5 MG tablet Take 0.5 tablets (1.25 mg total) by mouth 3 (three) times a week. 15 tablet 3  . nitroGLYCERIN (NITROSTAT) 0.4 MG SL tablet Place 1 tablet (0.4 mg total) under the tongue every 5 (five) minutes as needed for chest pain. 25 tablet 2  . Potassium Chloride 40 MEQ/15ML (20%) SOLN Take 15 mLs by mouth 3 (three) times daily. 4050 mL 3  . pravastatin (PRAVACHOL) 40 MG tablet Take 1 tablet (40 mg total) by mouth daily. 30 tablet 11  . torsemide (DEMADEX) 20 MG tablet Take 1 tablet (20 mg total) by mouth 2 (two) times daily. 60 tablet 11  . Vitamin D, Ergocalciferol, (DRISDOL) 50000 units CAPS capsule Take 50,000 Units every 7 (seven) days by mouth.     No current facility-administered medications for this encounter.     Allergies  Allergen Reactions  . Spironolactone Hives      Social History   Socioeconomic History  . Marital status: Married    Spouse name: Not on file  . Number of children: Not on file  . Years of education: Not on file  . Highest education level: Not on file  Occupational History  . Not on file  Social Needs  . Financial resource strain: Not on file  . Food insecurity:    Worry: Not on file    Inability: Not on file  . Transportation needs:    Medical: Not on file    Non-medical: Not on file  Tobacco Use  . Smoking status: Never Smoker  . Smokeless tobacco: Never Used  Substance and Sexual Activity  . Alcohol use: No    Frequency: Never  . Drug use: Not on file  . Sexual activity: Not on file  Lifestyle  . Physical activity:    Days per week: Not on file    Minutes per session: Not on file  . Stress: Not on file   Relationships  . Social connections:    Talks on phone: Not on file    Gets together: Not on file    Attends religious service: Not on file    Active member of club or organization: Not on file    Attends meetings of clubs or organizations: Not on file    Relationship status: Not on file  . Intimate partner violence:    Fear of current or ex partner: Not on file    Emotionally abused: Not on file    Physically abused: Not on file    Forced sexual activity: Not on file  Other Topics Concern  .  Not on file  Social History Narrative  . Not on file      Family History  Problem Relation Age of Onset  . CAD Paternal Grandmother   . Stroke Paternal Grandfather   . CAD Paternal Grandfather     Vitals:   01/02/18 1150  BP: 92/60  Pulse: 79  SpO2: 95%  Weight: 193 lb (87.5 kg)   Filed Weights   01/02/18 1150  Weight: 193 lb (87.5 kg)     PHYSICAL EXAM: General: [place]. No respiratory difficulty HEENT: normal. Anicteric  Neck: supple. JVP ~10-12  Carotids 2+ bilat; no bruits. No lymphadenopathy or thyromegaly appreciated. Cor: PMI nondisplaced. Irregular rhythm. +RV lift. 2/6 TR  Lungs: inspiratory wheezing throughout Abdomen: tight, nontender, +++distended. No hepatosplenomegaly. No bruits or masses. Good bowel sounds. Extremities: no clubbing, rash, BLE 2+ edema to knees, dusky fingertips. warm Neuro: alert & oriented x 3, cranial nerves grossly intact. moves all 4 extremities w/o difficulty. Affect pleasant.  ECG: Afib 90 with low voltage infero and anterolateral Qs. Personally reviewed   ASSESSMENT & PLAN:  1. Chronic systolic HF due to ICM. R/LHC 09/2015: PCI mid RCA, PCI to distal LAD, PA 62/34, PCWP 33, Thermo CO 2.98, CI 2.98, Echo 11/2016: EF 20-25% (per note, results unavailable), Declined ICD in past. Now with progressive RV failure symptoms.   - NYHA II-III, Volume status elevated - Increase toresmide to 40 mg am, 20 mg pm. CMET today.  - Continue eplerenone  25 mg daily - Start midodrine to support BP - DC lisinopril and corlanor  - Intolerant to BB in the past.  - Repeat Echo - Repeat R/LHC - PYP scan and myeloma panel to evaluate for amyloid.  - Consider cardiac MRI if creatinine permits  2. CAD - No s/s ischemia - Continue statin - DC ASA and plavix with starting eliquis for afib (see below)  3. Pulm HTN with PA 62/34, wedge 33 on RHC 2017 - Repeat RHC - Will need to get PFTs at some point  4. CKD - Creatinine 2.28 10/2017 - BMET today  5. Ascites - Scheduled for paracentesis next week  - Encouraged him to get paracentesis more frequently - Will need to get liver US at some point  6. Afib - new - Rate controlled - DC asa and plavix - Start eliquis 5 mg BID  Georgiana Shore, NP 01/02/18   Patient seen and examined with the above-signed Advanced Practice Provider and/or Housestaff. I personally reviewed laboratory data, imaging studies and relevant notes. I independently examined the patient and formulated the important aspects of the plan. I have edited the note to reflect any of my changes or salient points. I have personally discussed the plan with the patient and/or family.  Difficult case. He has R>>L heart failure symptoms with marked ascites and evidence of pulmonary HTN. ECG is suggestive of large previous anterolateral MI but he denies having an acute MI in the past. Creatinine currently prohibitive for repeat coronary angio.   Main issue now is to determine etiology of RV failure (seems markedly out of proportion to LV failure) and help with volume overload. I suspect he also has cardiac cirrhosis. Low BP limits therapy. Will plan  1) RHC and repeat echo 2) PYP scan to look for amyloid 3) Serologies: ESR, ANA, ANCA, Hepatitis, HIV, RF, [place]-70, LFTs, TFTs, myeloma panel 4) Will d/c ASA and switch to Eliquis for new onset AF  5) Stop lisinopril and ivabradine 6) Start midodrine for  BP support 7) Increase torsemide to  40/20 8) Repeat paracentesis 9) Eventually will need PFTs, VQ and liver u/s  Glori Bickers, MD  10:55 PM

## 2018-01-02 NOTE — H&P (View-Only) (Signed)
Advanced [facility] Failure [facility] Consult Note   Referring Physician: Dr Bettina Gavia PCP: [name]., MD PCP-Cardiologist: Dr Bettina Gavia  HPI: Cody Schwartz is a 62 y.o. male with chronic systolic failure due to ICM, CAD s/p PCI (most recent 09/2015), CKD, ascites requiring paracentesis, moderate pulmonary HTN referred by Dr. Bettina Gavia for further evaluation of HF and pulmonary HTN<.  He last saw Dr Bettina Gavia on 12/09/17. He has had progressive worsening of symptoms with weight gain and abdominal distension. Has required paracentesis with high-volume taps. Says taps are now becoming more frequent. He has been intolerant of BB and spiro due to low BP. He is on lose dose ACE-I.   He presents today as a new patient. Overall, doing well. He has had problems with abdominal distension for about 1 year that had been responsive to torsemide. Now has to have paracentesis q2 months for the last 6 months. Works as a Theme park manager. Denies any history of having an MI, ETOH or tobacco use. Says CAD was found incidentally. SOB with walking about 1/4 mile or less feels like it may be getting worse. Denies orthopnea/PND/peripheral edema. BP always low. His abdomen is very distended, comes on slowly. No fever, chills, or cough. No dizziness. Energy level ok. Good appetite. Urinates a lot with torsemide. Weights: 177-193 lbs, fluctuating depending on paracentesis. Today he is 181 lbs. Compliant with medications. Limits fluid and salt.   Lives with his wife. No tobacco, ETOH, or drug use. Works full time as a Theme park manager.  Echo 09/2015: (not available in echo system to review) Severely reduced overall LV systolic function. Estimated LV ejection fraction 25% There is global hypokinesis with akinesis of the anteroseptum, distal anterior wall, and apex. Indeterminate LV diastolic function. Mild global RV hypokinesis. Biatrial dilation. Mild mitral regurgitation Mild tricuspid regurgitation  Echo 11/2016: EF 20-25% (per Dr Oren Binet  note)  [place] 10/12/2015: 2 vessel CAD - LAD occluded at old stent site, de novo RCA lesion Successful PCI / Xience Drug Eluting Stent of the mid Right Coronary Artery. Successful PCI / PTCA of the distal Left Anterior Descending Coronary Artery.  [place] 10/12/2015: Hemodynamics (mmHg) RA mean 24 RV 59/17 PA 62/34 PCWP 33 AO 113 Cardiac Output (Thermo) 2.98 Cardiac Index (Thermo) 1.4   Review of Systems: [y] = yes, _0  = no   General: Weight gain Blue.Reese ]; Weight loss _1 ; Anorexia _2 ; Fatigue _3 ; Fever _4 ; Chills _5 ; Weakness _6   Cardiac: Chest pain/pressure _7 ; Resting SOB _8 ; Exertional SOB _9 ; Orthopnea _10 ; Pedal Edema _11 ; Palpitations _12 ; Syncope _13 ; Presyncope _14 ; Paroxysmal nocturnal dyspnea_15   Pulmonary: Cough _16 ; Wheezing_17 ; Hemoptysis_18 ; Sputum _19 ; Snoring _20   GI: Vomiting_21 ; Dysphagia_22 ; Melena_23 ; Hematochezia _24 ; Heartburn_25 ; Abdominal pain _26 ; Constipation _27 ; Diarrhea _28 ; BRBPR _29   GU: Hematuria_30 ; Dysuria _31 ; Nocturia_32   Vascular: Pain in legs with walking _33 ; Pain in feet with lying flat _34 ; Non-healing sores _35 ; Stroke _36 ; TIA _37 ; Slurred speech _38 ;  Neuro: Headaches_39 ; Vertigo_40 ; Seizures_41 ; Paresthesias_42 ;Blurred vision _43 ; Diplopia _44 ; Vision changes _45   Ortho/Skin: Arthritis _46 ; Joint pain _47 ; Muscle pain _48 ; Joint swelling _49 ; Back Pain _50 ; Rash _51   Psych: Depression_52 ; Anxiety_53   Heme: Bleeding problems _54 ; Clotting disorders _55 ; Anemia _56   Endocrine: Diabetes _0 ; Thyroid dysfunction_1    Past Medical History:  Diagnosis Date  . Hyperlipidemia   . Hypokalemia     Current Outpatient Medications  Medication Sig Dispense Refill  . allopurinol (ZYLOPRIM) 100 MG tablet Take 100 mg by mouth daily.  12  . allopurinol (ZYLOPRIM) 300 MG tablet Take 300 mg by mouth daily.  12  . aspirin EC 81 MG tablet Take 81 mg by mouth daily.    . clopidogrel (PLAVIX) 75 MG tablet Take 75 mg by mouth daily.    [name] eplerenone  (INSPRA) 25 MG tablet Take 1 tablet (25 mg total) daily by mouth. 30 tablet 2  . ivabradine (CORLANOR) 7.5 MG TABS tablet Take 7.5 mg by mouth 2 (two) times daily.    [name] levothyroxine (SYNTHROID, LEVOTHROID) 125 MCG tablet TAKE ONE TABLET BY MOUTH DAILY (INCREASED DUE TO RECENT LAB RESULTS)  3  . lisinopril (PRINIVIL,ZESTRIL) 2.5 MG tablet Take 0.5 tablets (1.25 mg total) by mouth 3 (three) times a week. 15 tablet 3  . nitroGLYCERIN (NITROSTAT) 0.4 MG SL tablet Place 1 tablet (0.4 mg total) under the tongue every 5 (five) minutes as needed for chest pain. 25 tablet 2  . Potassium Chloride 40 MEQ/15ML (20%) SOLN Take 15 mLs by mouth 3 (three) times daily. 4050 mL 3  . pravastatin (PRAVACHOL) 40 MG tablet Take 1 tablet (40 mg total) by mouth daily. 30 tablet 11  . torsemide (DEMADEX) 20 MG tablet Take 1 tablet (20 mg total) by mouth 2 (two) times daily. 60 tablet 11  . Vitamin D, Ergocalciferol, (DRISDOL) 50000 units CAPS capsule Take 50,000 Units every 7 (seven) days by mouth.     No current facility-administered medications for this encounter.     Allergies  Allergen Reactions  . Spironolactone Hives      Social History   Socioeconomic History  . Marital status: Married    Spouse name: Not on file  . Number of children: Not on file  . Years of education: Not on file  . Highest education level: Not on file  Occupational History  . Not on file  Social Needs  . Financial resource strain: Not on file  . Food insecurity:    Worry: Not on file    Inability: Not on file  . Transportation needs:    Medical: Not on file    Non-medical: Not on file  Tobacco Use  . Smoking status: Never Smoker  . Smokeless tobacco: Never Used  Substance and Sexual Activity  . Alcohol use: No    Frequency: Never  . Drug use: Not on file  . Sexual activity: Not on file  Lifestyle  . Physical activity:    Days per week: Not on file    Minutes per session: Not on file  . Stress: Not on file   Relationships  . Social connections:    Talks on phone: Not on file    Gets together: Not on file    Attends religious service: Not on file    Active member of club or organization: Not on file    Attends meetings of clubs or organizations: Not on file    Relationship status: Not on file  . Intimate partner violence:    Fear of current or ex partner: Not on file    Emotionally abused: Not on file    Physically abused: Not on file    Forced sexual activity: Not on file  Other Topics Concern  .  Not on file  Social History Narrative  . Not on file      Family History  Problem Relation Age of Onset  . CAD Paternal Grandmother   . Stroke Paternal Grandfather   . CAD Paternal Grandfather     Vitals:   01/02/18 1150  BP: 92/60  Pulse: 79  SpO2: 95%  Weight: 193 lb (87.5 kg)   Filed Weights   01/02/18 1150  Weight: 193 lb (87.5 kg)     PHYSICAL EXAM: General: [place]. No respiratory difficulty HEENT: normal. Anicteric  Neck: supple. JVP ~10-12  Carotids 2+ bilat; no bruits. No lymphadenopathy or thyromegaly appreciated. Cor: PMI nondisplaced. Irregular rhythm. +RV lift. 2/6 TR  Lungs: inspiratory wheezing throughout Abdomen: tight, nontender, +++distended. No hepatosplenomegaly. No bruits or masses. Good bowel sounds. Extremities: no clubbing, rash, BLE 2+ edema to knees, dusky fingertips. warm Neuro: alert & oriented x 3, cranial nerves grossly intact. moves all 4 extremities w/o difficulty. Affect pleasant.  ECG: Afib 90 with low voltage infero and anterolateral Qs. Personally reviewed   ASSESSMENT & PLAN:  1. Chronic systolic HF due to ICM. R/LHC 09/2015: PCI mid RCA, PCI to distal LAD, PA 62/34, PCWP 33, Thermo CO 2.98, CI 2.98, Echo 11/2016: EF 20-25% (per note, results unavailable), Declined ICD in past. Now with progressive RV failure symptoms.   - NYHA II-III, Volume status elevated - Increase toresmide to 40 mg am, 20 mg pm. CMET today.  - Continue eplerenone  25 mg daily - Start midodrine to support BP - DC lisinopril and corlanor  - Intolerant to BB in the past.  - Repeat Echo - Repeat R/LHC - PYP scan and myeloma panel to evaluate for amyloid.  - Consider cardiac MRI if creatinine permits  2. CAD - No s/s ischemia - Continue statin - DC ASA and plavix with starting eliquis for afib (see below)  3. Pulm HTN with PA 62/34, wedge 33 on RHC 2017 - Repeat RHC - Will need to get PFTs at some point  4. CKD - Creatinine 2.28 10/2017 - BMET today  5. Ascites - Scheduled for paracentesis next week  - Encouraged him to get paracentesis more frequently - Will need to get liver US at some point  6. Afib - new - Rate controlled - DC asa and plavix - Start eliquis 5 mg BID  Georgiana Shore, NP 01/02/18   Patient seen and examined with the above-signed Advanced Practice Provider and/or Housestaff. I personally reviewed laboratory data, imaging studies and relevant notes. I independently examined the patient and formulated the important aspects of the plan. I have edited the note to reflect any of my changes or salient points. I have personally discussed the plan with the patient and/or family.  Difficult case. He has R>>L heart failure symptoms with marked ascites and evidence of pulmonary HTN. ECG is suggestive of large previous anterolateral MI but he denies having an acute MI in the past. Creatinine currently prohibitive for repeat coronary angio.   Main issue now is to determine etiology of RV failure (seems markedly out of proportion to LV failure) and help with volume overload. I suspect he also has cardiac cirrhosis. Low BP limits therapy. Will plan  1) RHC and repeat echo 2) PYP scan to look for amyloid 3) Serologies: ESR, ANA, ANCA, Hepatitis, HIV, RF, [place]-70, LFTs, TFTs, myeloma panel 4) Will d/c ASA and switch to Eliquis for new onset AF  5) Stop lisinopril and ivabradine 6) Start midodrine for  BP support 7) Increase torsemide to  40/20 8) Repeat paracentesis 9) Eventually will need PFTs, VQ and liver u/s  Glori Bickers, MD  10:55 PM

## 2018-01-02 NOTE — Patient Instructions (Signed)
Stop Corlanor  Stop Lisinopril  Stop Asprin  Stop Plavix  Start Midodrine 5 mg (1 tab), three times a day  Start Eliquis 5 mg (1 tab), twice a day  Increase Torsemide 40 mg (2 tabs) in AM, then 20 mg (1 tab) in pm   Labs drawn today (if we do not call you, then your lab work was stable)   Will have PYP done  Your physician has requested that you have a cardiac catheterization. Cardiac catheterization is used to diagnose and/or treat various heart conditions. Doctors may recommend this procedure for a number of different reasons. The most common reason is to evaluate chest pain. Chest pain can be a symptom of coronary artery disease (CAD), and cardiac catheterization can show whether plaque is narrowing or blocking your heart's arteries. This procedure is also used to evaluate the valves, as well as measure the blood flow and oxygen levels in different parts of your heart. For further information please visit [email or web address]. Please follow instruction sheet, as given.  Your physician has requested that you have an echocardiogram. Echocardiography is a painless test that uses sound waves to create images of your heart. It provides your doctor with information about the size and shape of your heart and how well your heart's chambers and valves are working. This procedure takes approximately one hour. There are no restrictions for this procedure.  Your physician recommends that you schedule a follow-up appointment in: 2-3 weeks with Dr. Haroldine Laws with  an a echocardiogram

## 2018-01-03 LAB — ANA: Anti Nuclear Antibody(ANA): NEGATIVE

## 2018-01-03 LAB — HIV ANTIBODY (ROUTINE TESTING W REFLEX): HIV Screen 4th Generation wRfx: NONREACTIVE

## 2018-01-06 ENCOUNTER — Ambulatory Visit: Payer: BLUE CROSS/BLUE SHIELD | Admitting: Cardiology

## 2018-01-06 LAB — ANCA TITERS
Atypical P-ANCA titer: <1:20 {titer}
C-ANCA: <1:20 {titer}
P-ANCA: <1:20 {titer}

## 2018-01-07 ENCOUNTER — Telehealth (HOSPITAL_COMMUNITY): Payer: Self-pay | Admitting: Cardiology

## 2018-01-07 DIAGNOSIS — R188 Other ascites: Secondary | ICD-10-CM | POA: Diagnosis not present

## 2018-01-07 DIAGNOSIS — R140 Abdominal distension (gaseous): Secondary | ICD-10-CM | POA: Diagnosis not present

## 2018-01-07 NOTE — Telephone Encounter (Signed)
TWSFKC:[id number];FVCBSW:[id number];Review Type:Prior Auth;Coverage Start Date:12/08/2017;Coverage End Date:01/07/2019;

## 2018-01-08 ENCOUNTER — Telehealth (HOSPITAL_COMMUNITY): Payer: Self-pay | Admitting: Vascular Surgery

## 2018-01-08 ENCOUNTER — Other Ambulatory Visit (HOSPITAL_COMMUNITY): Payer: Self-pay | Admitting: *Deleted

## 2018-01-08 ENCOUNTER — Encounter (HOSPITAL_COMMUNITY): Payer: BLUE CROSS/BLUE SHIELD | Admitting: Internal Medicine

## 2018-01-08 DIAGNOSIS — I5022 Chronic systolic (congestive) heart failure: Secondary | ICD-10-CM

## 2018-01-08 NOTE — Telephone Encounter (Signed)
Left pt message giving PYP TEST DATE AND TIME 01/17/18 @ 12, aske dpt to call back to confirm

## 2018-01-10 ENCOUNTER — Ambulatory Visit (HOSPITAL_COMMUNITY)
Admission: RE | Admit: 2018-01-10 | Discharge: 2018-01-10 | Disposition: A | Discharge status: Home / Self Care | Payer: BLUE CROSS/BLUE SHIELD | Source: Ambulatory Visit | Attending: Internal Medicine | Admitting: Internal Medicine

## 2018-01-10 ENCOUNTER — Telehealth (HOSPITAL_COMMUNITY): Payer: Self-pay | Admitting: *Deleted

## 2018-01-10 ENCOUNTER — Ambulatory Visit: Payer: BLUE CROSS/BLUE SHIELD | Admitting: Cardiology

## 2018-01-10 ENCOUNTER — Ambulatory Visit (HOSPITAL_COMMUNITY)
Admission: RE | Disposition: A | Discharge status: Home / Self Care | Payer: Self-pay | Source: Ambulatory Visit | Attending: Internal Medicine

## 2018-01-10 DIAGNOSIS — Z7902 Long term (current) use of antithrombotics/antiplatelets: Secondary | ICD-10-CM | POA: Diagnosis not present

## 2018-01-10 DIAGNOSIS — N189 Chronic kidney disease, unspecified: Secondary | ICD-10-CM | POA: Insufficient documentation

## 2018-01-10 DIAGNOSIS — Z888 Allergy status to other drugs, medicaments and biological substances status: Secondary | ICD-10-CM | POA: Diagnosis not present

## 2018-01-10 DIAGNOSIS — I420 Dilated cardiomyopathy: Secondary | ICD-10-CM

## 2018-01-10 DIAGNOSIS — Z79899 Other long term (current) drug therapy: Secondary | ICD-10-CM | POA: Insufficient documentation

## 2018-01-10 DIAGNOSIS — Z8249 Family history of ischemic heart disease and other diseases of the circulatory system: Secondary | ICD-10-CM | POA: Insufficient documentation

## 2018-01-10 DIAGNOSIS — E876 Hypokalemia: Secondary | ICD-10-CM | POA: Diagnosis not present

## 2018-01-10 DIAGNOSIS — I2510 Atherosclerotic heart disease of native coronary artery without angina pectoris: Secondary | ICD-10-CM | POA: Diagnosis not present

## 2018-01-10 DIAGNOSIS — Z79890 Hormone replacement therapy (postmenopausal): Secondary | ICD-10-CM | POA: Insufficient documentation

## 2018-01-10 DIAGNOSIS — I4891 Unspecified atrial fibrillation: Secondary | ICD-10-CM | POA: Diagnosis not present

## 2018-01-10 DIAGNOSIS — I5023 Acute on chronic systolic (congestive) heart failure: Secondary | ICD-10-CM

## 2018-01-10 DIAGNOSIS — Z823 Family history of stroke: Secondary | ICD-10-CM | POA: Diagnosis not present

## 2018-01-10 DIAGNOSIS — E785 Hyperlipidemia, unspecified: Secondary | ICD-10-CM | POA: Insufficient documentation

## 2018-01-10 DIAGNOSIS — Z955 Presence of coronary angioplasty implant and graft: Secondary | ICD-10-CM | POA: Diagnosis not present

## 2018-01-10 DIAGNOSIS — I272 Pulmonary hypertension, unspecified: Secondary | ICD-10-CM | POA: Insufficient documentation

## 2018-01-10 DIAGNOSIS — Z7982 Long term (current) use of aspirin: Secondary | ICD-10-CM | POA: Insufficient documentation

## 2018-01-10 DIAGNOSIS — R188 Other ascites: Secondary | ICD-10-CM | POA: Insufficient documentation

## 2018-01-10 DIAGNOSIS — I959 Hypotension, unspecified: Secondary | ICD-10-CM | POA: Diagnosis not present

## 2018-01-10 DIAGNOSIS — I252 Old myocardial infarction: Secondary | ICD-10-CM | POA: Diagnosis not present

## 2018-01-10 DIAGNOSIS — I5022 Chronic systolic (congestive) heart failure: Secondary | ICD-10-CM

## 2018-01-10 HISTORY — PX: RIGHT/LEFT HEART CATH AND CORONARY ANGIOGRAPHY: CATH118266

## 2018-01-10 LAB — POCT I-STAT 3, ART BLOOD GAS (G3+)
ACID-BASE DEFICIT: 4 mmol/L — AB (ref 0.0–2.0)
Bicarbonate: 18.1 mmol/L — ABNORMAL LOW (ref 20.0–28.0)
O2 SAT: 96 %
PO2 ART: 78 mmHg — AB (ref 83.0–108.0)
TCO2: 19 mmol/L — ABNORMAL LOW (ref 22–32)
pCO2 arterial: 25.9 mmHg — ABNORMAL LOW (ref 32.0–48.0)
pH, Arterial: 7.452 — ABNORMAL HIGH (ref 7.350–7.450)

## 2018-01-10 LAB — POCT I-STAT 3, VENOUS BLOOD GAS (G3P V)
ACID-BASE DEFICIT: 4 mmol/L — AB (ref 0.0–2.0)
Acid-base deficit: 3 mmol/L — ABNORMAL HIGH (ref 0.0–2.0)
BICARBONATE: 20.7 mmol/L (ref 20.0–28.0)
Bicarbonate: 20.7 mmol/L (ref 20.0–28.0)
O2 Saturation: 51 %
O2 Saturation: 51 %
PH VEN: 7.404 (ref 7.250–7.430)
TCO2: 22 mmol/L (ref 22–32)
TCO2: 22 mmol/L (ref 22–32)
pCO2, Ven: 33.1 mmHg — ABNORMAL LOW (ref 44.0–60.0)
pCO2, Ven: 34.3 mmHg — ABNORMAL LOW (ref 44.0–60.0)
pH, Ven: 7.388 (ref 7.250–7.430)
pO2, Ven: 27 mmHg — CL (ref 32.0–45.0)
pO2, Ven: 27 mmHg — CL (ref 32.0–45.0)

## 2018-01-10 LAB — GLUCOSE, CAPILLARY: GLUCOSE-CAPILLARY: 92 mg/dL (ref 65–99)

## 2018-01-10 SURGERY — RIGHT/LEFT HEART CATH AND CORONARY ANGIOGRAPHY
Anesthesia: LOCAL

## 2018-01-10 MED ORDER — SODIUM CHLORIDE 0.9 % IV SOLN: 250.0000 mL | INTRAVENOUS | Status: DC | PRN

## 2018-01-10 MED ORDER — FENTANYL CITRATE (PF) 100 MCG/2ML IJ SOLN
INTRAMUSCULAR | Status: AC
  Filled 2018-01-10: qty 2

## 2018-01-10 MED ORDER — HEPARIN (PORCINE) IN NACL 2-0.9 UNITS/ML
INTRAMUSCULAR | Status: AC | PRN
  Administered 2018-01-10 (×2): 500 mL

## 2018-01-10 MED ORDER — SODIUM CHLORIDE 0.9% FLUSH: 3.0000 mL | Freq: Two times a day (BID) | INTRAVENOUS | Status: DC

## 2018-01-10 MED ORDER — SODIUM CHLORIDE 0.9% FLUSH: 3.0000 mL | INTRAVENOUS | Status: DC | PRN

## 2018-01-10 MED ORDER — SODIUM CHLORIDE 0.9 % IV SOLN: INTRAVENOUS | Status: AC

## 2018-01-10 MED ORDER — IOPAMIDOL (ISOVUE-370) INJECTION 76%
INTRAVENOUS | Status: AC
  Filled 2018-01-10: qty 100

## 2018-01-10 MED ORDER — MIDAZOLAM HCL 2 MG/2ML IJ SOLN
INTRAMUSCULAR | Status: AC
  Filled 2018-01-10: qty 2

## 2018-01-10 MED ORDER — ACETAMINOPHEN 325 MG PO TABS: 650.0000 mg | ORAL_TABLET | ORAL | Status: DC | PRN

## 2018-01-10 MED ORDER — ASPIRIN 81 MG PO CHEW
81.0000 mg | CHEWABLE_TABLET | ORAL | Status: AC
  Administered 2018-01-10: 81 mg via ORAL

## 2018-01-10 MED ORDER — HEPARIN (PORCINE) IN NACL 1000-0.9 UT/500ML-% IV SOLN
INTRAVENOUS | Status: AC
  Filled 2018-01-10: qty 1000

## 2018-01-10 MED ORDER — IOPAMIDOL (ISOVUE-370) INJECTION 76%
INTRAVENOUS | Status: DC | PRN
  Administered 2018-01-10: 45 mL via INTRA_ARTERIAL

## 2018-01-10 MED ORDER — VERAPAMIL HCL 2.5 MG/ML IV SOLN
INTRAVENOUS | Status: AC
  Filled 2018-01-10: qty 2

## 2018-01-10 MED ORDER — ONDANSETRON HCL 4 MG/2ML IJ SOLN: 4.0000 mg | Freq: Four times a day (QID) | INTRAMUSCULAR | Status: DC | PRN

## 2018-01-10 MED ORDER — LIDOCAINE HCL (PF) 1 % IJ SOLN
INTRAMUSCULAR | Status: DC | PRN
  Administered 2018-01-10 (×3): 1 mL via SUBCUTANEOUS

## 2018-01-10 MED ORDER — HEPARIN SODIUM (PORCINE) 1000 UNIT/ML IJ SOLN
INTRAMUSCULAR | Status: DC | PRN
  Administered 2018-01-10: 3500 [IU] via INTRAVENOUS

## 2018-01-10 MED ORDER — ASPIRIN 81 MG PO CHEW
CHEWABLE_TABLET | ORAL | Status: AC
  Administered 2018-01-10: 81 mg via ORAL
  Filled 2018-01-10: qty 1

## 2018-01-10 MED ORDER — LIDOCAINE HCL (PF) 1 % IJ SOLN
INTRAMUSCULAR | Status: AC
  Filled 2018-01-10: qty 30

## 2018-01-10 MED ORDER — SODIUM CHLORIDE 0.9 % IV SOLN
INTRAVENOUS | Status: DC
  Administered 2018-01-10: 11:00:00 via INTRAVENOUS

## 2018-01-10 MED ORDER — HEPARIN SODIUM (PORCINE) 1000 UNIT/ML IJ SOLN
INTRAMUSCULAR | Status: AC
  Filled 2018-01-10: qty 1

## 2018-01-10 MED ORDER — VERAPAMIL HCL 2.5 MG/ML IV SOLN
INTRAVENOUS | Status: DC | PRN
  Administered 2018-01-10: 10 mL via INTRA_ARTERIAL

## 2018-01-10 SURGICAL SUPPLY — 17 items
BAND ZEPHYR COMPRESS 30 LONG (HEMOSTASIS) ×2 IMPLANT
CATH BALLN WEDGE 5F 110CM (CATHETERS) ×2 IMPLANT
CATH INFINITI 5 FR JL3.5 (CATHETERS) ×2 IMPLANT
CATH INFINITI JR4 5F (CATHETERS) ×2 IMPLANT
COVER PRB 48X5XTLSCP FOLD TPE (BAG) ×1 IMPLANT
COVER PROBE 5X48 (BAG) ×1
GUIDEWIRE INQWIRE 1.5J.035X260 (WIRE) ×1 IMPLANT
INQWIRE 1.5J .035X260CM (WIRE) ×2
KIT HEART LEFT (KITS) ×2 IMPLANT
NEEDLE PERC 21GX4CM (NEEDLE) ×2 IMPLANT
PACK CARDIAC CATHETERIZATION (CUSTOM PROCEDURE TRAY) ×2 IMPLANT
SHEATH RAIN 4/5FR (SHEATH) ×2 IMPLANT
SHEATH RAIN RADIAL 21G 6FR (SHEATH) ×4 IMPLANT
SYR MEDRAD MARK V 150ML (SYRINGE) ×2 IMPLANT
TRANSDUCER W/STOPCOCK (MISCELLANEOUS) ×2 IMPLANT
TUBING CIL FLEX 10 FLL-RA (TUBING) ×2 IMPLANT
WIRE EMERALD 3MM-J .025X260CM (WIRE) ×2 IMPLANT

## 2018-01-10 NOTE — Interval H&P Note (Signed)
History and Physical Interval Note:  01/10/2018 1:04 PM  Cody Schwartz  has presented today for surgery, with the diagnosis of chf  The various methods of treatment have been discussed with the patient and family. After consideration of risks, benefits and other options for treatment, the patient has consented to  Procedure(s): RIGHT/LEFT HEART CATH AND CORONARY ANGIOGRAPHY (N/A) and possible coronary angioplasty as a surgical intervention .  The patient's history has been reviewed, patient examined, no change in status, stable for surgery.  I have reviewed the patient's chart and labs.  Questions were answered to the patient's satisfaction.     Charmel Pronovost

## 2018-01-10 NOTE — Discharge Instructions (Signed)

## 2018-01-10 NOTE — Telephone Encounter (Signed)
CAD is stable. RHC indicates a mixed picture but suspect that RV failure is likely the biggest limiting factor. PA pressures only mildly elevated. Unclear at this point if this represents burned out [place] or primary RV failure. Will get cardiac MRI to further assess.  Glori Bickers, MD  2:09 PM   Order placed for cardiac MRI

## 2018-01-10 NOTE — Progress Notes (Signed)
While removing IV dressing, tape was being removed gently but skin adhered to iv dressing 1" x 1/2"  Pt states it did not hurt. Site was dressed with sterile gauze and coban, pt was told to leave it on 24 hours. Call PCP with further need.

## 2018-01-13 ENCOUNTER — Encounter (HOSPITAL_COMMUNITY): Payer: Self-pay | Admitting: Internal Medicine

## 2018-01-13 MED FILL — Midazolam HCl Inj 2 MG/2ML (Base Equivalent): INTRAMUSCULAR | Qty: 2 | Status: AC

## 2018-01-13 MED FILL — Heparin Sod (Porcine)-NaCl IV Soln 1000 Unit/500ML-0.9%: INTRAVENOUS | Qty: 1000 | Status: AC

## 2018-01-13 MED FILL — Fentanyl Citrate Preservative Free (PF) Inj 100 MCG/2ML: INTRAMUSCULAR | Qty: 2 | Status: AC

## 2018-01-17 ENCOUNTER — Encounter (HOSPITAL_COMMUNITY)
Admission: RE | Admit: 2018-01-17 | Discharge: 2018-01-17 | Disposition: A | Discharge status: Home / Self Care | Payer: BLUE CROSS/BLUE SHIELD | Source: Ambulatory Visit | Attending: Internal Medicine | Admitting: Internal Medicine

## 2018-01-17 DIAGNOSIS — I420 Dilated cardiomyopathy: Secondary | ICD-10-CM

## 2018-01-17 DIAGNOSIS — I509 Heart failure, unspecified: Secondary | ICD-10-CM | POA: Diagnosis not present

## 2018-01-17 MED ORDER — TECHNETIUM TC 99M PYROPHOSPHATE
22.0000 | Freq: Once | INTRAVENOUS | Status: AC
  Administered 2018-01-17: 22 via INTRAVENOUS
  Filled 2018-01-17: qty 22

## 2018-01-22 ENCOUNTER — Ambulatory Visit (HOSPITAL_COMMUNITY)
Admission: RE | Admit: 2018-01-22 | Discharge: 2018-01-22 | Disposition: A | Discharge status: Home / Self Care | Payer: BLUE CROSS/BLUE SHIELD | Source: Ambulatory Visit | Attending: Family Medicine | Admitting: Family Medicine

## 2018-01-22 ENCOUNTER — Other Ambulatory Visit (HOSPITAL_COMMUNITY): Payer: Self-pay | Admitting: *Deleted

## 2018-01-22 ENCOUNTER — Ambulatory Visit (HOSPITAL_BASED_OUTPATIENT_CLINIC_OR_DEPARTMENT_OTHER)
Admission: RE | Admit: 2018-01-22 | Discharge: 2018-01-22 | Disposition: A | Discharge status: Home / Self Care | Payer: BLUE CROSS/BLUE SHIELD | Source: Ambulatory Visit | Attending: Internal Medicine | Admitting: Internal Medicine

## 2018-01-22 ENCOUNTER — Encounter (HOSPITAL_COMMUNITY): Payer: Self-pay | Admitting: Internal Medicine

## 2018-01-22 ENCOUNTER — Other Ambulatory Visit: Payer: Self-pay

## 2018-01-22 VITALS — BP 95/59 | HR 84 | Wt 181.2 lb

## 2018-01-22 DIAGNOSIS — I5082 Biventricular heart failure: Secondary | ICD-10-CM | POA: Insufficient documentation

## 2018-01-22 DIAGNOSIS — E785 Hyperlipidemia, unspecified: Secondary | ICD-10-CM | POA: Insufficient documentation

## 2018-01-22 DIAGNOSIS — Z7901 Long term (current) use of anticoagulants: Secondary | ICD-10-CM | POA: Diagnosis not present

## 2018-01-22 DIAGNOSIS — Z79899 Other long term (current) drug therapy: Secondary | ICD-10-CM | POA: Diagnosis not present

## 2018-01-22 DIAGNOSIS — Z888 Allergy status to other drugs, medicaments and biological substances status: Secondary | ICD-10-CM | POA: Diagnosis not present

## 2018-01-22 DIAGNOSIS — I480 Paroxysmal atrial fibrillation: Secondary | ICD-10-CM

## 2018-01-22 DIAGNOSIS — Z79890 Hormone replacement therapy (postmenopausal): Secondary | ICD-10-CM | POA: Insufficient documentation

## 2018-01-22 DIAGNOSIS — N189 Chronic kidney disease, unspecified: Secondary | ICD-10-CM | POA: Insufficient documentation

## 2018-01-22 DIAGNOSIS — I340 Nonrheumatic mitral (valve) insufficiency: Secondary | ICD-10-CM | POA: Diagnosis not present

## 2018-01-22 DIAGNOSIS — I272 Pulmonary hypertension, unspecified: Secondary | ICD-10-CM | POA: Diagnosis not present

## 2018-01-22 DIAGNOSIS — I4891 Unspecified atrial fibrillation: Secondary | ICD-10-CM | POA: Diagnosis not present

## 2018-01-22 DIAGNOSIS — R188 Other ascites: Secondary | ICD-10-CM | POA: Diagnosis not present

## 2018-01-22 DIAGNOSIS — I2510 Atherosclerotic heart disease of native coronary artery without angina pectoris: Secondary | ICD-10-CM | POA: Diagnosis not present

## 2018-01-22 DIAGNOSIS — I255 Ischemic cardiomyopathy: Secondary | ICD-10-CM | POA: Insufficient documentation

## 2018-01-22 DIAGNOSIS — I50814 Right heart failure due to left heart failure: Secondary | ICD-10-CM | POA: Diagnosis not present

## 2018-01-22 DIAGNOSIS — I5022 Chronic systolic (congestive) heart failure: Secondary | ICD-10-CM

## 2018-01-22 LAB — BASIC METABOLIC PANEL
Anion gap: 10 (ref 5–15)
BUN: 24 mg/dL — AB (ref 6–20)
CALCIUM: 9 mg/dL (ref 8.9–10.3)
CO2: 24 mmol/L (ref 22–32)
CREATININE: 1.39 mg/dL — AB (ref 0.61–1.24)
Chloride: 99 mmol/L — ABNORMAL LOW (ref 101–111)
GFR calc Af Amer: >60 mL/min (ref >60)
GFR, EST NON AFRICAN AMERICAN: 52 mL/min — AB (ref >60)
GLUCOSE: 146 mg/dL — AB (ref 65–99)
Potassium: 4.6 mmol/L (ref 3.5–5.1)
SODIUM: 133 mmol/L — AB (ref 135–145)

## 2018-01-22 MED ORDER — TORSEMIDE 20 MG PO TABS: ORAL_TABLET | ORAL | 3 refills | Status: DC

## 2018-01-22 NOTE — Progress Notes (Signed)
Saliva collected for genetic testing.  Sample sent to [facility] for TTR via [employer].

## 2018-01-22 NOTE — Progress Notes (Signed)
  Echocardiogram 2D Echocardiogram has been performed.  Jennette Dubin 01/22/2018, 2:54 PM

## 2018-01-22 NOTE — Progress Notes (Signed)
Advanced Heart Failure Clinic Note   Referring Physician: Dr Bettina Gavia PCP: [name]., MD PCP-Cardiologist: Dr Bettina Gavia  HPI: Cody Schwartz is a 62 y.o. male with chronic systolic failure due to ICM, CAD s/p PCI (most recent 09/2015), CKD, ascites requiring paracentesis, moderate pulmonary HTN referred by Dr. Bettina Gavia for further evaluation of HF and pulmonary HTN<.  He last saw Dr Bettina Gavia on 12/09/17. He has had progressive worsening of symptoms with weight gain and abdominal distension. Has required paracentesis with high-volume taps. Says taps are now becoming more frequent. He has been intolerant of BB and spiro due to low BP. He is on lose dose ACE-I.   He presents today for regular follow up. Last visit torsemide was increased and pt was started on midodrine. He had a RHC and PYP scan last month (results below). Overall, he is feeling much better. BPs are better at home now with midodrine and he feels he has more energy. SBP 100s. He got a paracentesis 3 weeks ago with 9L off at [place]. Ascites starting to come back slowly but says it is much slower than usual. Has relatively good response to torsemide. SOB has improved. He is walking 1/4 mile + most days with no SOB.  No orthopnea/PND/edema. Ascites is starting to come back, but he feels it is more gradual than usual. No dizziness, CP. Weights at home 175-180 lbs. Compliant with medications.   Lives with his wife. No tobacco, ETOH, or drug use. Works full time as a Theme park manager.  Echo 09/2015: (not available in echo system to review) Severely reduced overall LV systolic function. Estimated LV ejection fraction 25% There is global hypokinesis with akinesis of the anteroseptum, distal anterior wall, and apex. Indeterminate LV diastolic function. Mild global RV hypokinesis. Biatrial dilation. Mild mitral regurgitation Mild tricuspid regurgitation  Echo 11/2016: EF 20-25% (per Dr Oren Binet note) Echo 01/22/18: EF 15% with severe RV dysfunction   Moderate MR Personally reviewed   [facility] 10/12/2015: 2 vessel CAD - LAD occluded at old stent site, de novo RCA lesion Successful PCI / Xience Drug Eluting Stent of the mid Right Coronary Artery. Successful PCI / PTCA of the distal Left Anterior Descending Coronary Artery.  [place] 10/12/2015: Hemodynamics (mmHg) RA mean 24 RV 59/17 PA 62/34 PCWP 33 AO 113 Cardiac Output (Thermo) 2.98 Cardiac Index (Thermo) 1.4  PYP 01/17/18: Visual and quantitative assessment (grade 1, H/CLL equal 1.45) are borderline values but concerning for transthyretin amyloidosis.  R/LHC 01/10/18: Ao = 82/59 (69) LV = 87/17 RA =  13 RV = 46/13 PA = 48/23 (33) PCW = 21 Fick cardiac output/index = 3.2/1.7 PVR = 3.9 WU Aosat = 96% PA sat = 51%, 51% Assessment: 1. 3v CAD with patent stents (LAD x 2), OM2, RCA 2. There is a 60-70% lesion int the mid LAD prior to previously placed stent. This is not flow-limiting. The apical LAD is occluded chronically 3. EF 25% by ECHO  4. Mild to moderately elevated filling pressures with low cardiac output Plan/Discussion: CAD is stable. RHC indicates a mixed picture but suspect that RV failure is likely the biggest limiting factor. PA pressures only mildly elevated. Unclear at this point if this represents burned out Cody Schwartz or primary RV failure. Will get cardiac MRI to further assess.   Past Medical History:  Diagnosis Date  . Hyperlipidemia   . Hypokalemia     Current Outpatient Medications  Medication Sig Dispense Refill  . allopurinol (ZYLOPRIM) 100 MG tablet Take 100 mg by  mouth daily.  12  . allopurinol (ZYLOPRIM) 300 MG tablet Take 300 mg by mouth daily.  12  . apixaban (ELIQUIS) 5 MG TABS tablet Take 1 tablet (5 mg total) by mouth 2 (two) times daily. 60 tablet 3  . eplerenone (INSPRA) 25 MG tablet Take 1 tablet (25 mg total) daily by mouth. 30 tablet 2  . levothyroxine (SYNTHROID, LEVOTHROID) 125 MCG tablet TAKE 125 MCG BY MOUTH DAILY  3  . midodrine (PROAMATINE)  5 MG tablet Take 1 tablet (5 mg total) by mouth 3 (three) times daily with meals. 90 tablet 3  . nitroGLYCERIN (NITROSTAT) 0.4 MG SL tablet Place 1 tablet (0.4 mg total) under the tongue every 5 (five) minutes as needed for chest pain. 25 tablet 2  . Potassium Chloride 40 MEQ/15ML (20%) SOLN Take 15 mLs by mouth 3 (three) times daily. 4050 mL 3  . pravastatin (PRAVACHOL) 40 MG tablet Take 1 tablet (40 mg total) by mouth daily. 30 tablet 11  . torsemide (DEMADEX) 20 MG tablet Take 2 tablets (40 mg total) by mouth every morning AND 1 tablet (20 mg total) every evening. 90 tablet 11  . Vitamin D, Ergocalciferol, (DRISDOL) 50000 units CAPS capsule Take 50,000 Units by mouth every 7 (seven) days. On Mondays     No current facility-administered medications for this encounter.     Allergies  Allergen Reactions  . Spironolactone Hives      Social History   Socioeconomic History  . Marital status: Married    Spouse name: Not on file  . Number of children: Not on file  . Years of education: Not on file  . Highest education level: Not on file  Occupational History  . Not on file  Social Needs  . Financial resource strain: Not on file  . Food insecurity:    Worry: Not on file    Inability: Not on file  . Transportation needs:    Medical: Not on file    Non-medical: Not on file  Tobacco Use  . Smoking status: Never Smoker  . Smokeless tobacco: Never Used  Substance and Sexual Activity  . Alcohol use: No    Frequency: Never  . Drug use: Not on file  . Sexual activity: Not on file  Lifestyle  . Physical activity:    Days per week: Not on file    Minutes per session: Not on file  . Stress: Not on file  Relationships  . Social connections:    Talks on phone: Not on file    Gets together: Not on file    Attends religious service: Not on file    Active member of club or organization: Not on file    Attends meetings of clubs or organizations: Not on file    Relationship status: Not  on file  . Intimate partner violence:    Fear of current or ex partner: Not on file    Emotionally abused: Not on file    Physically abused: Not on file    Forced sexual activity: Not on file  Other Topics Concern  . Not on file  Social History Narrative  . Not on file      Family History  Problem Relation Age of Onset  . CAD Paternal Grandmother   . Stroke Paternal Grandfather   . CAD Paternal Grandfather     Vitals:   01/22/18 1508  BP: (!) 95/59  Pulse: 84  SpO2: 100%  Weight: 181 lb 3.2 oz (82.2  kg)   Filed Weights   01/22/18 1508  Weight: 181 lb 3.2 oz (82.2 kg)     PHYSICAL EXAM: General: Ambulates in clinic. No resp difficulty. HEENT: Normal anicteric  Neck: Supple. JVP elevated 12-14. Carotids 2+ bilat; no bruits. No thyromegaly or nodule noted. Cor: PMI nondisplaced. IRR, +RV lift. 2/6 TR.  Lungs: CTAB, normal effort. No wheeze Abdomen: Soft, non-tender, ++distended, no HSM. No bruits or masses. +BS  Extremities: No cyanosis, clubbing, or rash. R and LLE 2-3+ edema up thighs. BLE TED hose on No results found for this or any previous visit.   ASSESSMENT & PLAN:  1. Chronic systolic HF due to ICM. R/LHC 09/2015: PCI mid RCA, PCI to distal LAD, PA 62/34, PCWP 33, Thermo CO 2.98, CI 2.98, Echo 11/2016: EF 20-25% (per note, results unavailable), Declined ICD in past. Now with progressive RV failure symptoms. [place] 01/10/18: elevated filling pressures with low cardiac output. PVR 3.9. PYP scan grade 1, CLL 1.45. Sed rate, HIV, ANA, ANCA negative. Echo today: EF 15-20%, severe biventricular failure. Reviewed by Dr Haroldine Laws.  - NYHA II-III, Volume status elevated. Weight is down 12 lbs from last visit.  - Increase toresmide to 60 mg am, 40 mg pm.  - Continue eplerenone 25 mg daily - Continue midodrine 5 mg TID - Intolerant to BB in the past.  - cardiac MRI if creatinine <1.5 on BMET today.  - Genetic testing today  2. CAD. LHC 4/19 with stable CAD - No s/s  ischemia - Continue statin - Not on ASA or plavix with eliquis  3. Pulm HTN with PA 62/34, wedge 33 on RHC 2017 - RHC with mildly elevated PA pressures (48/23), unclear is represents burned out PAH or primary RV failure.  - Will need to get PFTs and VQ scan at some point  4. CKD - Creatinine 1.44 01/02/18 - BMET today.   5. Ascites - Paracentesis 3 weeks ago with 9L off.  - Encouraged him to get paracentesis more frequently - Will need to get liver US at some point  6. Afib - new as of 01/02/18 - Rate controlled - Continue eliquis 5 mg BID   Cardiac MRI Increase torsemide to 60 mg am, 40 mg pm BMET today Genetic testing today Follow up in 6 weeks   Georgiana Shore, NP 01/22/18   Patient seen and examined with the above-signed Advanced Practice Provider and/or Housestaff. I personally reviewed laboratory data, imaging studies and relevant notes. I independently examined the patient and formulated the important aspects of the plan. I have edited the note to reflect any of my changes or salient points. I have personally discussed the plan with the patient and/or family.  Echo reviewed personally in clinic today. He has longstanding, severe biventricular dysfunction which I suspect is nearing end-stage. EF now 15%. NYHA III. Continues with R>L symptoms. Cath results reviewed with him. Remains volume overloaded. Will increase torsemide. BP too soft to increase other meds. PYP scan reviewed. Results equivocal. Will get cMRI and genetic testing. Remains in AF but tolerating well. Continue Eliquis.   We discussed possible admission for diuresis and he is not interested in that. We also discussed advanced therapies including transplant and VAD but he is likely to sick for these. Additionally, he says he is not interested in transplant eval. RV too weak for VAD.   Will continue aggressive medical therapy as above. Give severity of HF likely not ICD candidate. (He has refused ICD in  past).  Glori Bickers, MD  12:52 AM

## 2018-01-22 NOTE — Patient Instructions (Signed)
Increase Torsemide to 60 mg (3 tabs) in AM and 40 mg (2 tabs) in PM  Labs today  Your physician has requested that you have a cardiac MRI. Cardiac MRI uses a computer to create images of your heart as its beating, producing both still and moving pictures of your heart and major blood vessels. For further information please visit [email or web address] Please follow the instruction sheet given to you today for more information.  ONCE YOUR INSURANCE HAS APPROVED WE WILL CALL YOU TO SCHEDULE.  Your genetics test has been collected and sent out to [facility] for testing.  It can take a week or 2 to get the results, once we have them we will notified you of the results.  Your physician recommends that you schedule a follow-up appointment in: 6 weeks

## 2018-02-04 NOTE — Progress Notes (Signed)
Cardiology Office Note:    Date:  02/04/2018   ID:  Cody Schwartz, DOB [birth year], MRN [id number]  PCP:  [name]., MD  Cardiologist:  Shirlee More, MD    Referring MD: [name]., MD    ASSESSMENT:    1. Chronic systolic heart failure ([place])   2. Right heart failure (secondary to left heart failure) ([place])   3. Anasarca   4. Coronary artery disease involving native coronary artery of native heart with angina pectoris ([place])   5. Chronic kidney disease, unspecified CKD stage   6. Pure hypercholesterolemia   7. Chronic anticoagulation    PLAN:    In order of problems listed above:  1. Stable New York Heart Association class II he is fluid overloaded but poorly tolerant of increased diuretics and may require repeat paracentesis.  He is intolerant of beta-blocker and vasodilators with hypotension 2. Mildly fluid overloaded continue his current diuretic 3. Improved since the last time he did see me 4. Stable on recent coronary angiography 5. Stable 6. Stable continue his statin 7. Stable continue his anticoagulant   Next appointment: 3 months she will continue follow-up with the advanced heart failure program   Medication Adjustments/Labs and Tests Ordered: Current medicines are reviewed at length with the patient today.  Concerns regarding medicines are outlined above.  No orders of the defined types were placed in this encounter.  No orders of the defined types were placed in this encounter.   No chief complaint on file.   History of Present Illness:    Cody Schwartz is a 62 y.o. male with a hx of severe cardiomyopathy EF 20-25% 12/20/15, CHF, S/P PCI and DES of LAD and POBA of D on 01/08/2012 and CTO of LAD withSuccessful PCI / Xience Drug Eluting Stent of the mid Right Coronary Artery on 10/12/15.He was intolerant of carvedilol and ACEI with weakness and hypotension. He was  last seen by me  12/09/17. He has been evaluated by advanced heart failure and  has had recent left and right heart cath. He is scheduled for a cardiac MR on 02/11/18. Compliance with diet, lifestyle and medications: Yes  Since the addition of alpha agonist for hypotension he feels improved and presently is not having shortness of breath chest pain palpitation is having increased abdominal distention despite loop diuretic and Spironolactone.  He is awaiting cardiac MRI for evaluation of amyloidosis and I think he is at high risk with this combination of heart failure nonischemic cardiomyopathy neuropathy and equivocal hotspot imaging.  I have asked him to reconsider the issue of an ICD. Past Medical History:  Diagnosis Date  . Hyperlipidemia   . Hypokalemia      Past Surgical History:  Procedure Laterality Date  . RIGHT/LEFT HEART CATH AND CORONARY ANGIOGRAPHY N/A 01/10/2018   Procedure: RIGHT/LEFT HEART CATH AND CORONARY ANGIOGRAPHY;  Surgeon: Jolaine Artist, MD;  Location: [facility];  Service: Cardiovascular;  Laterality: N/A;    Current Medications: No outpatient medications have been marked as taking for the 02/05/18 encounter (Appointment) with Cody Priest, MD.     Allergies:   Spironolactone   Social History   Socioeconomic History  . Marital status: Married    Spouse name: Not on file  . Number of children: Not on file  . Years of education: Not on file  . Highest education level: Not on file  Occupational History  . Not on file  Social Needs  . Financial  resource strain: Not on file  . Food insecurity:    Worry: Not on file    Inability: Not on file  . Transportation needs:    Medical: Not on file    Non-medical: Not on file  Tobacco Use  . Smoking status: Never Smoker  . Smokeless tobacco: Never Used  Substance and Sexual Activity  . Alcohol use: No    Frequency: Never  . Drug use: Not on file  . Sexual activity: Not on file  Lifestyle  . Physical activity:    Days per week: Not on file    Minutes per session: Not on  file  . Stress: Not on file  Relationships  . Social connections:    Talks on phone: Not on file    Gets together: Not on file    Attends religious service: Not on file    Active member of club or organization: Not on file    Attends meetings of clubs or organizations: Not on file    Relationship status: Not on file  Other Topics Concern  . Not on file  Social History Narrative  . Not on file     Family History: The patient's family history includes CAD in his paternal grandfather and paternal grandmother; Stroke in his paternal grandfather. ROS:   Please see the history of present illness.    All other systems reviewed and are negative.  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  Echo 02/10/18: - Moderately dilated LV with EF 15%, diffuse hypokinesis. Mildly   dilated RV with moderately decreased systolic function. Biatrial   enlargement. Moderate functional mitral regurgitation. Mild   pulmonary hypertension. Dilated IVC suggestive of elevated RV   filling pressure. Heart cath 01/10/18: Assessment: 1. 3v CAD with patent stents (LAD x 2), OM2, RCA 2. There is a 60-70% lesion int the mid LAD prior to previously placed stent. This is not flow-limiting. The apical LAD is occluded chronically 3. EF 25% by ECHO 4. Mild to moderately elevated filling pressures with low cardiac output  Recent Labs: 03/29/2017: BNP CANCELED 10/28/2017: NT-Pro BNP 15,013 01/02/2018: ALT 12; Hemoglobin 13.0; Platelets 220; TSH 6.365 01/22/2018: BUN 24; Creatinine, Ser 1.39; Potassium 4.6; Sodium 133  Recent Lipid Panel    Component Value Date/Time   CHOL 118 03/29/2017 1231   TRIG 63 03/29/2017 1231   HDL 47 03/29/2017 1231   CHOLHDL 2.5 03/29/2017 1231   LDLCALC 58  03/29/2017 1231    Physical Exam:    VS:  There were no vitals taken for this visit.    Wt Readings from Last 3 Encounters:  01/22/18 181 lb 3.2 oz (82.2 kg)  01/10/18 170 lb (77.1 kg)  01/02/18 193 lb (87.5 kg)     GEN:  Well nourished, well developed in no acute distress HEENT: Normal NECK: No JVD; No carotid bruits LYMPHATICS: No lymphadenopathy CARDIAC: RRR, no murmurs, rubs, gallops RESPIRATORY:  Clear to auscultation without rales, wheezing or rhonchi  ABDOMEN: Soft, non-tender, non-distended MUSCULOSKELETAL:  No edema; No deformity  SKIN: Warm and dry NEUROLOGIC:  Alert and oriented x 3 PSYCHIATRIC:  Normal affect    Signed, Shirlee More, MD  02/04/2018 1:10 PM    [facility]

## 2018-02-05 ENCOUNTER — Encounter: Payer: Self-pay | Admitting: Internal Medicine

## 2018-02-05 ENCOUNTER — Ambulatory Visit: Payer: BLUE CROSS/BLUE SHIELD | Admitting: Cardiology

## 2018-02-05 VITALS — BP 94/68 | HR 85 | Ht 68.0 in | Wt 190.4 lb

## 2018-02-05 DIAGNOSIS — I950 Idiopathic hypotension: Secondary | ICD-10-CM

## 2018-02-05 DIAGNOSIS — I50814 Right heart failure due to left heart failure: Secondary | ICD-10-CM

## 2018-02-05 DIAGNOSIS — Z7901 Long term (current) use of anticoagulants: Secondary | ICD-10-CM | POA: Diagnosis not present

## 2018-02-05 DIAGNOSIS — R601 Generalized edema: Secondary | ICD-10-CM

## 2018-02-05 DIAGNOSIS — I5022 Chronic systolic (congestive) heart failure: Secondary | ICD-10-CM

## 2018-02-05 DIAGNOSIS — N189 Chronic kidney disease, unspecified: Secondary | ICD-10-CM

## 2018-02-05 DIAGNOSIS — I25119 Atherosclerotic heart disease of native coronary artery with unspecified angina pectoris: Secondary | ICD-10-CM | POA: Diagnosis not present

## 2018-02-05 DIAGNOSIS — I9589 Other hypotension: Secondary | ICD-10-CM | POA: Insufficient documentation

## 2018-02-05 DIAGNOSIS — E7800 Pure hypercholesterolemia, unspecified: Secondary | ICD-10-CM

## 2018-02-05 NOTE — Patient Instructions (Addendum)
Medication Instructions:  Your physician recommends that you continue on your current medications as directed. Please refer to the Current Medication list given to you today.  Labwork: None  Testing/Procedures: None  Follow-Up: Your physician wants you to follow-up in: 3 months. You will receive a reminder letter in the mail two months in advance. If you don't receive a letter, please call our office to schedule the follow-up appointment.  Any Other Special Instructions Will Be Listed Below (If Applicable).     If you need a refill on your cardiac medications before your next appointment, please call your pharmacy.    Amyloidosis

## 2018-02-11 ENCOUNTER — Ambulatory Visit (HOSPITAL_COMMUNITY)
Admission: RE | Admit: 2018-02-11 | Discharge: 2018-02-11 | Disposition: A | Discharge status: Home / Self Care | Payer: BLUE CROSS/BLUE SHIELD | Source: Ambulatory Visit | Attending: Internal Medicine | Admitting: Internal Medicine

## 2018-02-11 DIAGNOSIS — I517 Cardiomegaly: Secondary | ICD-10-CM | POA: Insufficient documentation

## 2018-02-11 DIAGNOSIS — I252 Old myocardial infarction: Secondary | ICD-10-CM | POA: Diagnosis not present

## 2018-02-11 DIAGNOSIS — I420 Dilated cardiomyopathy: Secondary | ICD-10-CM

## 2018-02-11 DIAGNOSIS — I429 Cardiomyopathy, unspecified: Secondary | ICD-10-CM | POA: Diagnosis not present

## 2018-02-11 MED ORDER — GADOBENATE DIMEGLUMINE 529 MG/ML IV SOLN
40.0000 mL | Freq: Once | INTRAVENOUS | Status: AC
  Administered 2018-02-11: 30 mL via INTRAVENOUS

## 2018-02-12 ENCOUNTER — Encounter (HOSPITAL_COMMUNITY): Payer: Self-pay | Admitting: Internal Medicine

## 2018-02-18 ENCOUNTER — Telehealth (HOSPITAL_COMMUNITY): Payer: Self-pay | Admitting: *Deleted

## 2018-02-18 NOTE — Telephone Encounter (Signed)
Called pt w/his test results  TTR genetic test was NEGATIVE   cMRI test was also negative for infiltrative dz  Pt is aware and agreeable, will keep appt as sch 6/18

## 2018-02-21 DIAGNOSIS — R188 Other ascites: Secondary | ICD-10-CM | POA: Diagnosis not present

## 2018-02-24 ENCOUNTER — Telehealth (HOSPITAL_COMMUNITY): Payer: Self-pay | Admitting: Vascular Surgery

## 2018-02-24 NOTE — Telephone Encounter (Signed)
Left pt message to resch appt due to DB not being in office, appt changed from 6/18 @2 ;20 to 6/19 @ 220, asked pt to call back to confirm

## 2018-03-05 ENCOUNTER — Other Ambulatory Visit (HOSPITAL_COMMUNITY): Payer: Self-pay | Admitting: Internal Medicine

## 2018-03-06 ENCOUNTER — Telehealth: Payer: Self-pay

## 2018-03-06 DIAGNOSIS — I5022 Chronic systolic (congestive) heart failure: Secondary | ICD-10-CM

## 2018-03-06 MED ORDER — EPLERENONE 25 MG PO TABS: 25.0000 mg | ORAL_TABLET | Freq: Every day | ORAL | 3 refills | Status: DC

## 2018-03-06 NOTE — Telephone Encounter (Signed)
Rx for eplerenone 25 mg one tablet daily #90 was sent to pharmacy as requested.

## 2018-03-11 ENCOUNTER — Encounter (HOSPITAL_COMMUNITY): Payer: BLUE CROSS/BLUE SHIELD | Admitting: Internal Medicine

## 2018-03-12 ENCOUNTER — Other Ambulatory Visit: Payer: Self-pay

## 2018-03-12 ENCOUNTER — Ambulatory Visit (HOSPITAL_COMMUNITY)
Admission: RE | Admit: 2018-03-12 | Discharge: 2018-03-12 | Disposition: A | Discharge status: Home / Self Care | Payer: BLUE CROSS/BLUE SHIELD | Source: Ambulatory Visit | Attending: Internal Medicine | Admitting: Internal Medicine

## 2018-03-12 VITALS — BP 98/60 | HR 91 | Wt 209.0 lb

## 2018-03-12 DIAGNOSIS — R188 Other ascites: Secondary | ICD-10-CM | POA: Insufficient documentation

## 2018-03-12 DIAGNOSIS — I5022 Chronic systolic (congestive) heart failure: Secondary | ICD-10-CM

## 2018-03-12 DIAGNOSIS — I50810 Right heart failure, unspecified: Secondary | ICD-10-CM | POA: Diagnosis not present

## 2018-03-12 DIAGNOSIS — Z79899 Other long term (current) drug therapy: Secondary | ICD-10-CM | POA: Diagnosis not present

## 2018-03-12 DIAGNOSIS — I4891 Unspecified atrial fibrillation: Secondary | ICD-10-CM | POA: Diagnosis not present

## 2018-03-12 DIAGNOSIS — I5084 End stage heart failure: Secondary | ICD-10-CM | POA: Diagnosis not present

## 2018-03-12 DIAGNOSIS — N189 Chronic kidney disease, unspecified: Secondary | ICD-10-CM | POA: Diagnosis not present

## 2018-03-12 DIAGNOSIS — I272 Pulmonary hypertension, unspecified: Secondary | ICD-10-CM | POA: Diagnosis not present

## 2018-03-12 DIAGNOSIS — R601 Generalized edema: Secondary | ICD-10-CM | POA: Diagnosis not present

## 2018-03-12 DIAGNOSIS — I5082 Biventricular heart failure: Secondary | ICD-10-CM | POA: Insufficient documentation

## 2018-03-12 DIAGNOSIS — I5023 Acute on chronic systolic (congestive) heart failure: Secondary | ICD-10-CM

## 2018-03-12 DIAGNOSIS — I2510 Atherosclerotic heart disease of native coronary artery without angina pectoris: Secondary | ICD-10-CM | POA: Insufficient documentation

## 2018-03-12 DIAGNOSIS — Z7901 Long term (current) use of anticoagulants: Secondary | ICD-10-CM | POA: Insufficient documentation

## 2018-03-12 LAB — BASIC METABOLIC PANEL
ANION GAP: 10 (ref 5–15)
BUN: 59 mg/dL — ABNORMAL HIGH (ref 6–20)
CHLORIDE: 105 mmol/L (ref 101–111)
CO2: 19 mmol/L — ABNORMAL LOW (ref 22–32)
CREATININE: 1.8 mg/dL — AB (ref 0.61–1.24)
Calcium: 8.8 mg/dL — ABNORMAL LOW (ref 8.9–10.3)
GFR calc non Af Amer: 38 mL/min — ABNORMAL LOW (ref >60)
GFR, EST AFRICAN AMERICAN: 44 mL/min — AB (ref >60)
Glucose, Bld: 157 mg/dL — ABNORMAL HIGH (ref 65–99)
Potassium: 3.2 mmol/L — ABNORMAL LOW (ref 3.5–5.1)
SODIUM: 134 mmol/L — AB (ref 135–145)

## 2018-03-12 MED ORDER — TORSEMIDE 20 MG PO TABS: 60.0000 mg | ORAL_TABLET | Freq: Two times a day (BID) | ORAL | 3 refills | Status: DC

## 2018-03-12 MED ORDER — METOLAZONE 5 MG PO TABS: 5.0000 mg | ORAL_TABLET | ORAL | 3 refills | Status: DC

## 2018-03-12 NOTE — Progress Notes (Signed)
Advanced Heart Failure Clinic Note   Referring Physician: Dr Bettina Gavia PCP: [name]., MD PCP-Cardiologist: Dr Bettina Gavia  HPI: Cody Schwartz is a 62 y.o. male with chronic systolic failure due to ICM, CAD s/p PCI (most recent 09/2015), CKD, ascites requiring paracentesis, moderate pulmonary HTN referred by Dr. Bettina Gavia for further evaluation of HF and pulmonary HTN.[name]  He last saw Dr Bettina Gavia on 12/09/17. He has had progressive worsening of symptoms with weight gain and abdominal distension. Has required paracentesis with high-volume taps.  He presents today for regular follow up. Last visit torsemide increased and cMRI scheduled. cMRI negative for infiltrative disease. EF 17%.. Genetic testing negative for TTR amyloid. Weight up 28 lbs from last visit (6 weeks). He states over the past few weeks he has been holding to more fluid. Not responding to torsemide well He used to get along better when he was on metolazone, but thinks this was months ago. Breathing is "OK". Sleeps propped up, but has chronically. No SOB at rest. Has had a lot more edema. Notes marked ab bloating with edema up into thighs and flanks. Weight at home up over 200 lbs. He is scheduled for paracentesis tomorrow. Gets them every 3-5 weeks.   Lives with his wife. No tobacco, ETOH, or drug use. Works full time as a [employer] manager.  cMRI 02/13/18 LVEF 17%, Severe LAE and RAE, Moderate MR. Findings consistent with NICM (Non-infiltrative, non-inflammatory)  Echo 09/2015: (not available in echo system to review) Severely reduced overall LV systolic function. Estimated LV ejection fraction 25% There is global hypokinesis with akinesis of the anteroseptum, distal anterior wall, and apex. Indeterminate LV diastolic function. Mild global RV hypokinesis. Biatrial dilation. Mild mitral regurgitation Mild tricuspid regurgitation  Echo 11/2016: EF 20-25% (per Dr Oren Binet note) Echo 01/22/18: EF 15% with severe RV dysfunction  Moderate MR  Personally reviewed   LHC 10/12/2015: 2 vessel CAD - LAD occluded at old stent site, de novo RCA lesion Successful PCI / Xience Drug Eluting Stent of the mid Right Coronary Artery. Successful PCI / PTCA of the distal Left Anterior Descending Coronary Artery.  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  R/LHC 01/10/18: Ao = 82/59 (69) LV = 87/17 RA =  13 RV = 46/13 PA = 48/23 (33) PCW = 21 Fick cardiac output/index = 3.2/1.7 PVR = 3.9 WU Aosat = 96% PA sat = 51%, 51% Assessment: 1. 3v CAD with patent stents (LAD x 2), OM2, RCA 2. There is a 60-70% lesion int the mid LAD prior to previously placed stent. This is not flow-limiting. The apical LAD is occluded chronically 3. EF 25% by ECHO  4. Mild to moderately elevated filling pressures with low cardiac output Plan/Discussion: CAD is stable. RHC indicates a mixed picture but suspect that RV failure is likely the biggest limiting factor. PA pressures only mildly elevated. Unclear at this point if this represents burned out [place] or primary RV failure. Will get cardiac MRI to further assess.   Past Medical History:  Diagnosis Date  . Hyperlipidemia   . Hypokalemia     Current Outpatient Medications  Medication Sig Dispense Refill  . allopurinol (ZYLOPRIM) 100 MG tablet Take 100 mg by mouth daily.  12  . allopurinol (ZYLOPRIM) 300 MG tablet Take 300 mg by mouth  daily.  12  . apixaban (ELIQUIS) 5 MG TABS tablet Take 1 tablet (5 mg total) by mouth 2 (two) times daily. 60 tablet 3  . eplerenone (INSPRA) 25 MG tablet Take 1 tablet (25 mg total) by mouth daily. 90 tablet 3  . levothyroxine (SYNTHROID, LEVOTHROID) 125 MCG tablet TAKE 125 MCG BY MOUTH DAILY  3  . midodrine (PROAMATINE) 5 MG tablet  Take 1 tablet (5 mg total) by mouth 3 (three) times daily with meals. 90 tablet 3  . nitroGLYCERIN (NITROSTAT) 0.4 MG SL tablet Place 1 tablet (0.4 mg total) under the tongue every 5 (five) minutes as needed for chest pain. 25 tablet 2  . Potassium Chloride 40 MEQ/15ML (20%) SOLN Take 15 mLs by mouth 3 (three) times daily. 4050 mL 3  . pravastatin (PRAVACHOL) 40 MG tablet Take 1 tablet (40 mg total) by mouth daily. 30 tablet 11  . torsemide (DEMADEX) 20 MG tablet Take 3 tablets (60 mg total) by mouth every morning AND 2 tablets (40 mg total) every evening. 150 tablet 3  . Vitamin D, Ergocalciferol, (DRISDOL) 50000 units CAPS capsule Take 50,000 Units by mouth every 7 (seven) days. On Mondays     No current facility-administered medications for this encounter.     Allergies  Allergen Reactions  . Spironolactone Hives      Social History   Socioeconomic History  . Marital status: Married    Spouse name: Not on file  . Number of children: Not on file  . Years of education: Not on file  . Highest education level: Not on file  Occupational History  . Not on file  Social Needs  . Financial resource strain: Not on file  . Food insecurity:    Worry: Not on file    Inability: Not on file  . Transportation needs:    Medical: Not on file    Non-medical: Not on file  Tobacco Use  . Smoking status: Never Smoker  . Smokeless tobacco: Never Used  Substance and Sexual Activity  . Alcohol use: No    Frequency: Never  . Drug use: Not on file  . Sexual activity: Not on file  Lifestyle  . Physical activity:    Days per week: Not on file    Minutes per session: Not on file  . Stress: Not on file  Relationships  . Social connections:    Talks on phone: Not on file    Gets together: Not on file    Attends religious service: Not on file    Active member of club or organization: Not on file    Attends meetings of clubs or organizations: Not on file    Relationship status: Not on file  .  Intimate partner violence:    Fear of current or ex partner: Not on file    Emotionally abused: Not on file    Physically abused: Not on file    Forced sexual activity: Not on file  Other Topics Concern  . Not on file  Social History Narrative  . Not on file      Family History  Problem Relation Age of Onset  . CAD Paternal Grandmother   . Stroke Paternal Grandfather   . CAD Paternal Grandfather     Vitals:   03/12/18 1407  BP: 98/60  Pulse: 91  SpO2: 97%  Weight: 209 lb (94.8 kg)   Wt Readings from Last 3 Encounters:  03/12/18 209 lb (94.8 kg)  02/05/18 190 lb  6.4 oz (86.4 kg)  01/22/18 181 lb 3.2 oz (82.2 kg)   PHYSICAL EXAM: General: Ambulates in clinic. NAD.  HEENT: Normal anicteric Neck: Supple. JVP to jaw. Carotids 2+ bilat; no bruits. No thyromegaly or nodule noted. Cor: PMI nondisplaced. Irregularly irregular. + RV lift. 2/6 TR.  Lungs: CTAB, normal effort. No wheeze Abdomen: ++ marked ab distention. + Anasarca to his chest and into his back. Non-tender, No HSM. No bruits or masses. +BS  Extremities: No clubbing, or rash. + nail bed cyanosis 3+ edema through his thighs and into his flanks and back.  Neuro: Alert & orientedx3, cranial nerves grossly intact. moves all 4 extremities w/o difficulty. Affect pleasant   ASSESSMENT & PLAN:  1. Chronic systolic HF due to ICM. R/LHC 09/2015:  - PCI mid RCA, PCI to distal LAD, PA 62/34, PCWP 33, Thermo CO 2.98, CI 2.98, Echo 11/2016: EF 20-25% (per note, results unavailable), Declined ICD in past.  - Now with progressive RV failure symptoms. [place] 01/10/18: elevated filling pressures with low cardiac output. PVR 3.9. PYP scan grade 1, CLL 1.45. Sed rate, HIV, ANA, ANCA negative.  - Echo 01/22/18: EF 15-20%, severe biventricular failure.  - cMRI 02/13/18 LVEF 17%, RVEF 26% (Mod/Sev reduced), Severe LAE and RAE, Moderate MR. Findings consistent with NICM (Non-infiltrative, non-inflammatory) - NYHA III - Volume status markedly  elevated on exam. Likely has 30-40+ lbs of fluid on.  - Increase torsemide to 60 mg BID and give metolazone 5 mg x next 3 days, then Monday Friday - Continue eplerenone 25 mg daily - Continue midodrine 5 mg TID for now. May need to increase.  - Intolerant to BB in the past.  - Genetic testing sent.  2. CAD. LHC 4/19 with stable CAD - No s/s of ischemia.    - Continue statin - Not on ASA or plavix with eliquis  3. Pulm HTN with PA 62/34, wedge 33 on RHC 2017 - RHC with mildly elevated PA pressures (48/23), unclear is represents burned out PAH or primary RV failure.  - Will need to get PFTs and VQ scan at some point  4. CKD - Creatinine 1.39 01/22/18.  - BMET today.   5. Ascites - Paracentesis 12/2017 with 9L off.  - Encouraged him to get paracentesis more frequently - Will need to get liver US at some point  6. Afib - new as of 01/02/18  - Rate controlled. - Continue eliquis 5 mg BID  Aggressive po diuretics as above. Pt refuses admission at this time. BMET today. Paracentesis already ordered for tomorrow. Pt agrees to call for admission if his symptoms worsen.   Shirley Friar, PA-C 03/12/18    Patient seen and examined with the above-signed Advanced Practice Provider and/or Housestaff. I personally reviewed laboratory data, imaging studies and relevant notes. I independently examined the patient and formulated the important aspects of the plan. I have edited the note to reflect any of my changes or salient points. I have personally discussed the plan with the patient and/or family.  He has end-stage CHF with severe biventricular failure and R>>L HF symptoms. I suspect he also has a component of cardiac cirrhosis. Now massively volume ovelroaded. Have suggested hospital admit but he would like yo avoid if possible. Will plan paracentesis. Will increase torsemide to 60 bid and start metolazone 5mg  daily for 3 days then every M/F. If not responding to metolazone over next 2  days he is to call us Friday morning for admission. W/u for  amyloid surprisingly negative. Not ICD candidate at this point with advanced symptoms.  Total time spent 35 minutes. Over half that time spent discussing above.   Glori Bickers, MD  11:22 PM

## 2018-03-12 NOTE — Patient Instructions (Signed)
Increase Torsemide to 60 mg (3 tabs) Twice daily   Start Metolazone 5 mg DAILY FOR 3 DAYS, THEN Take it every Monday and Friday  Lab today  Your physician recommends that you schedule a follow-up appointment in: 1 week

## 2018-03-13 DIAGNOSIS — R188 Other ascites: Secondary | ICD-10-CM | POA: Diagnosis not present

## 2018-03-19 ENCOUNTER — Ambulatory Visit (HOSPITAL_COMMUNITY)
Admission: RE | Admit: 2018-03-19 | Discharge: 2018-03-19 | Disposition: A | Discharge status: Home / Self Care | Payer: BLUE CROSS/BLUE SHIELD | Source: Ambulatory Visit | Attending: Internal Medicine | Admitting: Internal Medicine

## 2018-03-19 ENCOUNTER — Telehealth (HOSPITAL_COMMUNITY): Payer: Self-pay | Admitting: *Deleted

## 2018-03-19 VITALS — BP 96/72 | HR 69 | Wt 185.0 lb

## 2018-03-19 DIAGNOSIS — I272 Pulmonary hypertension, unspecified: Secondary | ICD-10-CM | POA: Insufficient documentation

## 2018-03-19 DIAGNOSIS — Z7901 Long term (current) use of anticoagulants: Secondary | ICD-10-CM | POA: Insufficient documentation

## 2018-03-19 DIAGNOSIS — Z955 Presence of coronary angioplasty implant and graft: Secondary | ICD-10-CM | POA: Insufficient documentation

## 2018-03-19 DIAGNOSIS — R188 Other ascites: Secondary | ICD-10-CM | POA: Diagnosis not present

## 2018-03-19 DIAGNOSIS — E785 Hyperlipidemia, unspecified: Secondary | ICD-10-CM | POA: Diagnosis not present

## 2018-03-19 DIAGNOSIS — I5022 Chronic systolic (congestive) heart failure: Secondary | ICD-10-CM | POA: Diagnosis not present

## 2018-03-19 DIAGNOSIS — Z8249 Family history of ischemic heart disease and other diseases of the circulatory system: Secondary | ICD-10-CM | POA: Insufficient documentation

## 2018-03-19 DIAGNOSIS — I2510 Atherosclerotic heart disease of native coronary artery without angina pectoris: Secondary | ICD-10-CM | POA: Diagnosis not present

## 2018-03-19 DIAGNOSIS — E876 Hypokalemia: Secondary | ICD-10-CM | POA: Diagnosis not present

## 2018-03-19 DIAGNOSIS — I5023 Acute on chronic systolic (congestive) heart failure: Secondary | ICD-10-CM

## 2018-03-19 DIAGNOSIS — I50810 Right heart failure, unspecified: Secondary | ICD-10-CM

## 2018-03-19 DIAGNOSIS — Z79899 Other long term (current) drug therapy: Secondary | ICD-10-CM | POA: Insufficient documentation

## 2018-03-19 DIAGNOSIS — R601 Generalized edema: Secondary | ICD-10-CM

## 2018-03-19 DIAGNOSIS — I5082 Biventricular heart failure: Secondary | ICD-10-CM | POA: Insufficient documentation

## 2018-03-19 DIAGNOSIS — I4891 Unspecified atrial fibrillation: Secondary | ICD-10-CM | POA: Diagnosis not present

## 2018-03-19 LAB — BASIC METABOLIC PANEL
Anion gap: 13 (ref 5–15)
BUN: 77 mg/dL — ABNORMAL HIGH (ref 8–23)
CHLORIDE: 91 mmol/L — AB (ref 98–111)
CO2: 23 mmol/L (ref 22–32)
CREATININE: 2.1 mg/dL — AB (ref 0.61–1.24)
Calcium: 8.8 mg/dL — ABNORMAL LOW (ref 8.9–10.3)
GFR calc non Af Amer: 32 mL/min — ABNORMAL LOW (ref >60)
GFR, EST AFRICAN AMERICAN: 37 mL/min — AB (ref >60)
Glucose, Bld: 150 mg/dL — ABNORMAL HIGH (ref 70–99)
Potassium: 2.3 mmol/L — CL (ref 3.5–5.1)
Sodium: 127 mmol/L — ABNORMAL LOW (ref 135–145)

## 2018-03-19 MED ORDER — METOLAZONE 5 MG PO TABS: 5.0000 mg | ORAL_TABLET | ORAL | 3 refills | Status: DC

## 2018-03-19 NOTE — Patient Instructions (Signed)
Increase Metolazone to every Mon, Wed and Fri  Increase Potassium to 3 times a day, take an extra dose on Mon, Wed and Fri with Metolazone  Labs today  Labs in 1 week  Abdominal Ultrasound  Your physician recommends that you schedule a follow-up appointment in: 2-3 weeks

## 2018-03-19 NOTE — Telephone Encounter (Signed)
Pt can not take Sprio as he had a reaction (hives) to it in the past, he will just take the extra KCL and recheck labs on Friday.

## 2018-03-19 NOTE — Addendum Note (Signed)
Encounter addended by: Scarlette Calico, RN on: 03/19/2018 10:43 AM  Actions taken: Diagnosis association updated, Order list changed, Sign clinical note

## 2018-03-19 NOTE — Telephone Encounter (Signed)
Notes recorded by Scarlette Calico, RN on 03/19/2018 at 1:20 PM EDT Per Dr Haroldine Laws, pt needs to take 5 doses of liquid KCL today, start Spiro 12.5 mg daily and recheck labs on Fri 6/28. Pt is aware, agreeable and verbalizes understanding, new rx sent in, labs sch 6/28

## 2018-03-19 NOTE — Progress Notes (Signed)
Advanced Heart Failure Clinic Note   Referring Physician: Dr Bettina Gavia PCP: [name]., MD PCP-Cardiologist: Dr Bettina Gavia  HPI: Cody Schwartz is a 62 y.o. male with chronic systolic failure due to ICM, CAD s/p PCI (most recent 09/2015), CKD, ascites requiring paracentesis, moderate pulmonary HTN referred by Dr. Bettina Gavia for further evaluation of HF and pulmonary HTN.[name]  He last saw Dr Bettina Gavia on 12/09/17. He has had progressive worsening of symptoms with weight gain and abdominal distension. Has required paracentesis with high-volume taps.  He returns today for HF follow up. We saw him last week and was very volume overloaded. Suggested admission but he refused. So told to take metolazone x 3 days, then 2x/week and torsemide was increased. He also went for paracentesis the following day. Overall doing much better. Had a paracentesis with 8 L off. Weight down 24 lbs but still not at baseline. Denies SOB. Edema has improved, but still has some in BLE and abdomen. Able to get around better.  Sleeps at an incline at baseline. No CP or dizziness. Taking all meds. Weights at home ~175 lbs (200 lbs last week). Limits fluid and salt intake.   Lives with his wife. No tobacco, ETOH, or drug use. Works full time as a Theme park manager.  cMRI 02/13/18 LVEF 17%, Severe LAE and RAE, Moderate MR. Findings consistent with NICM (Non-infiltrative, non-inflammatory)  Echo 09/2015: (not available in echo system to review) Severely reduced overall LV systolic function. Estimated LV ejection fraction 25% There is global hypokinesis with akinesis of the anteroseptum, distal anterior wall, and apex. Indeterminate LV diastolic function. Mild global RV hypokinesis. Biatrial dilation. Mild mitral regurgitation Mild tricuspid regurgitation  Echo 11/2016: EF 20-25% (per Dr Oren Binet note) Echo 01/22/18: EF 15% with severe RV dysfunction  Moderate MR Personally reviewed   LHC 10/12/2015: 2 vessel CAD - LAD occluded at old stent site,  de novo RCA lesion Successful PCI / Xience Drug Eluting Stent of the mid Right Coronary Artery. Successful PCI / PTCA of the distal Left Anterior Descending Coronary Artery.  [place] 10/12/2015: Hemodynamics (mmHg) RA mean 24 RV 59/17 PA 62/34 PCWP 33 AO 113 Cardiac Output (Thermo) 2.98 Cardiac Index (Thermo) 1.4  PYP 01/17/18: Visual and quantitative assessment (grade 1, H/CLL equal 1.45) are borderline values but concerning for transthyretin amyloidosis.  R/LHC 01/10/18: Ao = 82/59 (69) LV = 87/17 RA =  13 RV = 46/13 PA = 48/23 (33) PCW = 21 Fick cardiac output/index = 3.2/1.7 PVR = 3.9 WU Aosat = 96% PA sat = 51%, 51% Assessment: 1. 3v CAD with patent stents (LAD x 2), OM2, RCA 2. There is a 60-70% lesion int the mid LAD prior to previously placed stent. This is not flow-limiting. The apical LAD is occluded chronically 3. EF 25% by ECHO  4. Mild to moderately elevated filling pressures with low cardiac output Plan/Discussion: CAD is stable. RHC indicates a mixed picture but suspect that RV failure is likely the biggest limiting factor. PA pressures only mildly elevated. Unclear at this point if this represents burned out [place] or primary RV failure. Will get cardiac MRI to further assess.  Review of systems complete and found to be negative unless listed in HPI.   Past Medical History:  Diagnosis Date  . Hyperlipidemia   . Hypokalemia     Current Outpatient Medications  Medication Sig Dispense Refill  . allopurinol (ZYLOPRIM) 100 MG tablet Take 100 mg by mouth daily.  12  . allopurinol (ZYLOPRIM) 300 MG tablet Take  300 mg by mouth daily.  12  . apixaban (ELIQUIS) 5 MG TABS tablet Take 1 tablet (5 mg total) by mouth 2 (two) times daily. 60 tablet 3  . eplerenone (INSPRA) 25 MG tablet Take 1 tablet (25 mg total) by mouth daily. 90 tablet 3  . levothyroxine (SYNTHROID, LEVOTHROID) 125 MCG tablet TAKE 125 MCG BY MOUTH DAILY  3  . metolazone (ZAROXOLYN) 5 MG tablet Take 1  tablet (5 mg total) by mouth 2 (two) times a week. Every Monday and Friday 15 tablet 3  . midodrine (PROAMATINE) 5 MG tablet Take 1 tablet (5 mg total) by mouth 3 (three) times daily with meals. 90 tablet 3  . nitroGLYCERIN (NITROSTAT) 0.4 MG SL tablet Place 1 tablet (0.4 mg total) under the tongue every 5 (five) minutes as needed for chest pain. 25 tablet 2  . Potassium Chloride 40 MEQ/15ML (20%) SOLN Take 15 mLs by mouth 3 (three) times daily. 4050 mL 3  . pravastatin (PRAVACHOL) 40 MG tablet Take 1 tablet (40 mg total) by mouth daily. 30 tablet 11  . torsemide (DEMADEX) 20 MG tablet Take 3 tablets (60 mg total) by mouth 2 (two) times daily. 180 tablet 3  . Vitamin D, Ergocalciferol, (DRISDOL) 50000 units CAPS capsule Take 50,000 Units by mouth every 7 (seven) days. On Mondays     No current facility-administered medications for this encounter.     Allergies  Allergen Reactions  . Spironolactone Hives      Social History   Socioeconomic History  . Marital status: Married    Spouse name: Not on file  . Number of children: Not on file  . Years of education: Not on file  . Highest education level: Not on file  Occupational History  . Not on file  Social Needs  . Financial resource strain: Not on file  . Food insecurity:    Worry: Not on file    Inability: Not on file  . Transportation needs:    Medical: Not on file    Non-medical: Not on file  Tobacco Use  . Smoking status: Never Smoker  . Smokeless tobacco: Never Used  Substance and Sexual Activity  . Alcohol use: No    Frequency: Never  . Drug use: Not on file  . Sexual activity: Not on file  Lifestyle  . Physical activity:    Days per week: Not on file    Minutes per session: Not on file  . Stress: Not on file  Relationships  . Social connections:    Talks on phone: Not on file    Gets together: Not on file    Attends religious service: Not on file    Active member of club or organization: Not on file     Attends meetings of clubs or organizations: Not on file    Relationship status: Not on file  . Intimate partner violence:    Fear of current or ex partner: Not on file    Emotionally abused: Not on file    Physically abused: Not on file    Forced sexual activity: Not on file  Other Topics Concern  . Not on file  Social History Narrative  . Not on file      Family History  Problem Relation Age of Onset  . CAD Paternal Grandmother   . Stroke Paternal Grandfather   . CAD Paternal Grandfather     Vitals:   03/19/18 0936  BP: 96/72  Pulse: 69  SpO2:  98%  Weight: 185 lb (83.9 kg)   Wt Readings from Last 3 Encounters:  03/19/18 185 lb (83.9 kg)  03/12/18 209 lb (94.8 kg)  02/05/18 190 lb 6.4 oz (86.4 kg)   PHYSICAL EXAM: General: Sitting on exam chair . No resp difficulty. HEENT: Normal anicteric  Neck: Supple. JVP 10-12. Carotids 2+ bilat; no bruits. No thyromegaly or nodule noted. Cor: PMI nondisplaced. IRR, +RV lift, 2/6 TR.  Lungs: CTAB, normal effort. Abdomen: Soft, non-tender, ++distended, no HSM. No bruits or masses. +BS  Extremities: No cyanosis, clubbing, or rash. R and LLE +3 edema up thighs.   Neuro: Alert & orientedx3, cranial nerves grossly intact. moves all 4 extremities w/o difficulty. Affect pleasant  ASSESSMENT & PLAN:  1. Chronic systolic HF due to ICM. R/LHC 09/2015:  - PCI mid RCA, PCI to distal LAD, PA 62/34, PCWP 33, Thermo CO 2.98, CI 2.98, Echo 11/2016: EF 20-25% (per note, results unavailable), Declined ICD in past.  - Now with progressive RV failure symptoms. [place] 01/10/18: elevated filling pressures with low cardiac output. PVR 3.9. PYP scan grade 1, CLL 1.45. Sed rate, HIV, ANA, ANCA negative.  - Echo 01/22/18: EF 15-20%, severe biventricular failure.  - cMRI 02/13/18 LVEF 17%, RVEF 26% (Mod/Sev reduced), Severe LAE and RAE, Moderate MR. Findings consistent with NICM (Non-infiltrative, non-inflammatory) - NYHA III - Volume status much improved but  still markedly  elevated - Continue torsemide to 60 mg BID and metolazone on Monday and Friday - Continue eplerenone 25 mg daily - Continue midodrine 5 mg TID for now.  - Intolerant to BB in the past.  - Genetic testing negative for TTR.  2. CAD. LHC 4/19 with stable CAD - No s/s ischemia.    - Continue statin - Not on ASA or plavix with eliquis  3. Pulm HTN with PA 62/34, wedge 33 on RHC 2017 - RHC with mildly elevated PA pressures (48/23), unclear is represents burned out PAH or primary RV failure.  - Will need to get PFTs and VQ scan at some point  4. CKD - Creatinine 1.39 01/22/18.  - BMET today.   5. Ascites - Paracentesis 12/2017 with 9L off.  - Encouraged him to get paracentesis more frequently - Will need RUQ u/s- Paracentesis 02/2018 with 8L off.   6. Afib - new as of 01/02/18  - Rate controlled. - Continue eliquis 5 mg BID. No bleeding.   7. Hypokalemia - recheck today. Can only tolerate liquid potassium.  Georgiana Shore, NP 03/19/18   Patient seen and examined with the above-signed Advanced Practice Provider and/or Housestaff. I personally reviewed laboratory data, imaging studies and relevant notes. I independently examined the patient and formulated the important aspects of the plan. I have edited the note to reflect any of my changes or salient points. I have personally discussed the plan with the patient and/or family.  Volume status much improved. But still markedly volume overloaded in setting of severe biventricular HF. Will increase metolazone to 3x/week now. Increase KCL to 40 tid with extra 40 on metolazone days. Check labs today and next week. RTC 2-3 week for check. Will need RUQ u/s to look for cirrhotic physiology and portal HTN.   Glori Bickers, MD  10:27 AM

## 2018-03-21 ENCOUNTER — Ambulatory Visit (HOSPITAL_COMMUNITY)
Admission: RE | Admit: 2018-03-21 | Discharge: 2018-03-21 | Disposition: A | Discharge status: Home / Self Care | Payer: BLUE CROSS/BLUE SHIELD | Source: Ambulatory Visit | Attending: Internal Medicine | Admitting: Internal Medicine

## 2018-03-21 DIAGNOSIS — I5022 Chronic systolic (congestive) heart failure: Secondary | ICD-10-CM | POA: Insufficient documentation

## 2018-03-21 LAB — BASIC METABOLIC PANEL
Anion gap: 12 (ref 5–15)
BUN: 87 mg/dL — ABNORMAL HIGH (ref 8–23)
CALCIUM: 9 mg/dL (ref 8.9–10.3)
CO2: 22 mmol/L (ref 22–32)
Chloride: 95 mmol/L — ABNORMAL LOW (ref 98–111)
Creatinine, Ser: 2.12 mg/dL — ABNORMAL HIGH (ref 0.61–1.24)
GFR, EST AFRICAN AMERICAN: 36 mL/min — AB (ref >60)
GFR, EST NON AFRICAN AMERICAN: 31 mL/min — AB (ref >60)
GLUCOSE: 176 mg/dL — AB (ref 70–99)
POTASSIUM: 4 mmol/L (ref 3.5–5.1)
SODIUM: 129 mmol/L — AB (ref 135–145)

## 2018-03-26 ENCOUNTER — Ambulatory Visit (HOSPITAL_COMMUNITY)
Admission: RE | Admit: 2018-03-26 | Discharge: 2018-03-26 | Disposition: A | Discharge status: Home / Self Care | Payer: BLUE CROSS/BLUE SHIELD | Source: Ambulatory Visit | Attending: Internal Medicine | Admitting: Internal Medicine

## 2018-03-26 DIAGNOSIS — I5022 Chronic systolic (congestive) heart failure: Secondary | ICD-10-CM | POA: Insufficient documentation

## 2018-03-26 LAB — BASIC METABOLIC PANEL
Anion gap: 12 (ref 5–15)
BUN: 99 mg/dL — ABNORMAL HIGH (ref 8–23)
CHLORIDE: 96 mmol/L — AB (ref 98–111)
CO2: 23 mmol/L (ref 22–32)
CREATININE: 1.92 mg/dL — AB (ref 0.61–1.24)
Calcium: 9.3 mg/dL (ref 8.9–10.3)
GFR calc non Af Amer: 35 mL/min — ABNORMAL LOW (ref >60)
GFR, EST AFRICAN AMERICAN: 41 mL/min — AB (ref >60)
Glucose, Bld: 172 mg/dL — ABNORMAL HIGH (ref 70–99)
Potassium: 4.5 mmol/L (ref 3.5–5.1)
Sodium: 131 mmol/L — ABNORMAL LOW (ref 135–145)

## 2018-04-04 DIAGNOSIS — R188 Other ascites: Secondary | ICD-10-CM | POA: Diagnosis not present

## 2018-04-07 NOTE — Progress Notes (Signed)
Advanced Heart Failure Clinic Note   Referring Physician: Dr Bettina Gavia PCP: [name]., MD PCP-Cardiologist: Dr Bettina Gavia HF: Dr Haroldine Laws   HPI: Cody Schwartz is a 62 y.o. male with chronic systolic failure due to ICM, CAD s/p PCI (most recent 09/2015), CKD, ascites requiring paracentesis, moderate pulmonary HTN referred by Dr. Bettina Gavia for further evaluation of HF and pulmonary HTN.[name]  He last saw Dr Bettina Gavia on 12/09/17. He has had progressive worsening of symptoms with weight gain and abdominal distension. Has required paracentesis with high-volume taps.  He returns today for HF follow up. Last visit, metolazone was increased to 3x/week. Overall doing better. He denies SOB. He sleeps at an incline at baseline. BLE edema improving, but still present. He had a paracentesis 04/05/18 with 10 L off. Weights are down 5 lbs on his home scale, weighing 170 lbs. Denies dizziness, CP, or palpitations. Denies bleeding on eliquis. Taking all medications. Limits fluid and salt intake.   Lives with his wife. No tobacco, ETOH, or drug use. Works full time as a Theme park manager.  cMRI 02/13/18 LVEF 17%, Severe LAE and RAE, Moderate MR. Findings consistent with NICM (Non-infiltrative, non-inflammatory)  Echo 09/2015: (not available in echo system to review) Severely reduced overall LV systolic function. Estimated LV ejection fraction 25% There is global hypokinesis with akinesis of the anteroseptum, distal anterior wall, and apex. Indeterminate LV diastolic function. Mild global RV hypokinesis. Biatrial dilation. Mild mitral regurgitation Mild tricuspid regurgitation  Echo 11/2016: EF 20-25% (per Dr Oren Binet note) Echo 01/22/18: EF 15% with severe RV dysfunction  Moderate MR Personally reviewed  LHC 10/12/2015: 2 vessel CAD - LAD occluded at old stent site, de novo RCA lesion Successful PCI / Xience Drug Eluting Stent of the mid Right Coronary Artery. Successful PCI / PTCA of the distal Left Anterior Descending  Coronary Artery.  [place] 10/12/2015: Hemodynamics (mmHg) RA mean 24 RV 59/17 PA 62/34 PCWP 33 AO 113 Cardiac Output (Thermo) 2.98 Cardiac Index (Thermo) 1.4  PYP 01/17/18: Visual and quantitative assessment (grade 1, H/CLL equal 1.45) are borderline values but concerning for transthyretin amyloidosis.  R/LHC 01/10/18: Ao = 82/59 (69) LV = 87/17 RA =  13 RV = 46/13 PA = 48/23 (33) PCW = 21 Fick cardiac output/index = 3.2/1.7 PVR = 3.9 WU Aosat = 96% PA sat = 51%, 51% Assessment: 1. 3v CAD with patent stents (LAD x 2), OM2, RCA 2. There is a 60-70% lesion int the mid LAD prior to previously placed stent. This is not flow-limiting. The apical LAD is occluded chronically 3. EF 25% by ECHO  4. Mild to moderately elevated filling pressures with low cardiac output Plan/Discussion: CAD is stable. RHC indicates a mixed picture but suspect that RV failure is likely the biggest limiting factor. PA pressures only mildly elevated. Unclear at this point if this represents burned out [place] or primary RV failure. Will get cardiac MRI to further assess.  Review of systems complete and found to be negative unless listed in HPI.   Past Medical History:  Diagnosis Date  . Hyperlipidemia   . Hypokalemia     Current Outpatient Medications  Medication Sig Dispense Refill  . allopurinol (ZYLOPRIM) 100 MG tablet Take 100 mg by mouth daily.  12  . allopurinol (ZYLOPRIM) 300 MG tablet Take 300 mg by mouth daily.  12  . apixaban (ELIQUIS) 5 MG TABS tablet Take 1 tablet (5 mg total) by mouth 2 (two) times daily. 60 tablet 3  . eplerenone (INSPRA) 25 MG  tablet Take 1 tablet (25 mg total) by mouth daily. 90 tablet 3  . levothyroxine (SYNTHROID, LEVOTHROID) 125 MCG tablet TAKE 125 MCG BY MOUTH DAILY  3  . metolazone (ZAROXOLYN) 5 MG tablet Take 1 tablet (5 mg total) by mouth 3 (three) times a week. Every Monday, Wed and Friday 15 tablet 3  . midodrine (PROAMATINE) 5 MG tablet Take 1 tablet (5 mg total) by  mouth 3 (three) times daily with meals. 90 tablet 3  . nitroGLYCERIN (NITROSTAT) 0.4 MG SL tablet Place 1 tablet (0.4 mg total) under the tongue every 5 (five) minutes as needed for chest pain. 25 tablet 2  . Potassium Chloride 40 MEQ/15ML (20%) SOLN Take 15 mLs by mouth 3 (three) times daily. 4050 mL 3  . pravastatin (PRAVACHOL) 40 MG tablet Take 1 tablet (40 mg total) by mouth daily. 30 tablet 11  . torsemide (DEMADEX) 20 MG tablet Take 3 tablets (60 mg total) by mouth 2 (two) times daily. 180 tablet 3  . Vitamin D, Ergocalciferol, (DRISDOL) 50000 units CAPS capsule Take 50,000 Units by mouth every 7 (seven) days. On Mondays     No current facility-administered medications for this encounter.     Allergies  Allergen Reactions  . Spironolactone Hives      Social History   Socioeconomic History  . Marital status: Married    Spouse name: Not on file  . Number of children: Not on file  . Years of education: Not on file  . Highest education level: Not on file  Occupational History  . Not on file  Social Needs  . Financial resource strain: Not on file  . Food insecurity:    Worry: Not on file    Inability: Not on file  . Transportation needs:    Medical: Not on file    Non-medical: Not on file  Tobacco Use  . Smoking status: Never Smoker  . Smokeless tobacco: Never Used  Substance and Sexual Activity  . Alcohol use: No    Frequency: Never  . Drug use: Not on file  . Sexual activity: Not on file  Lifestyle  . Physical activity:    Days per week: Not on file    Minutes per session: Not on file  . Stress: Not on file  Relationships  . Social connections:    Talks on phone: Not on file    Gets together: Not on file    Attends religious service: Not on file    Active member of club or organization: Not on file    Attends meetings of clubs or organizations: Not on file    Relationship status: Not on file  . Intimate partner violence:    Fear of current or ex partner: Not  on file    Emotionally abused: Not on file    Physically abused: Not on file    Forced sexual activity: Not on file  Other Topics Concern  . Not on file  Social History Narrative  . Not on file      Family History  Problem Relation Age of Onset  . CAD Paternal Grandmother   . Stroke Paternal Grandfather   . CAD Paternal Grandfather     Vitals:   04/08/18 1012  BP: 104/68  Pulse: 90  SpO2: 100%  Weight: 179 lb (81.2 kg)   Wt Readings from Last 3 Encounters:  04/08/18 179 lb (81.2 kg)  03/19/18 185 lb (83.9 kg)  03/12/18 209 lb (94.8 kg)  PHYSICAL EXAM: General: No resp difficulty. HEENT: Normal Neck: Supple. JVP 10-12. Carotids 2+ bilat; no bruits. No thyromegaly or nodule noted. Cor: PMI nondisplaced. IRR, +RV lift, 2/6 TR Lungs: CTAB, normal effort. Abdomen: Soft, non-tender, +distended, no HSM. No bruits or masses. +BS  Extremities: No cyanosis, clubbing, or rash. R and LLE 2+ edema up thighs  Neuro: Alert & orientedx3, cranial nerves grossly intact. moves all 4 extremities w/o difficulty. Affect pleasant  ASSESSMENT & PLAN:  1. Chronic biventricular HF due to ICM. R/LHC 09/2015:  - PCI mid RCA, PCI to distal LAD, PA 62/34, PCWP 33, Thermo CO 2.98, CI 2.98, Echo 11/2016: EF 20-25% (per note, results unavailable), Declined ICD in past.  - Now with progressive RV failure symptoms. [place] 01/10/18: elevated filling pressures with low cardiac output. PVR 3.9. PYP scan grade 1, CLL 1.45. Sed rate, HIV, ANA, ANCA negative.  - Echo 01/22/18: EF 15-20%, severe biventricular failure.  - cMRI 02/13/18 LVEF 17%, RVEF 26% (Mod/Sev reduced), Severe LAE and RAE, Moderate MR. Findings consistent with NICM (Non-infiltrative, non-inflammatory) - NYHA III - Volume status improved, but still elevated - Increase torsemide to 80 mg BID. Continue metolazone 3x/week on MWF with extra 40 meq potassium. BMET today and in 7 days. - Continue eplerenone 25 mg daily - Continue midodrine 5 mg TID  for now.  - Intolerant to BB in the past.  - Genetic testing negative for TTR.  2. CAD. LHC 4/19 with stable CAD - No s/s ischemia - Continue statin - Not on ASA or plavix with eliquis  3. Pulm HTN with PA 62/34, wedge 33 on RHC 2017 - RHC with mildly elevated PA pressures (48/23), unclear is represents burned out PAH or primary RV failure.  - Will need to get PFTs and VQ scan at some point  4. CKD - BMET today. Last creatinine 1.9 03/26/18  5. Ascites - Paracentesis 12/2017 with 9L off.  - Encouraged him to get paracentesis more frequently - Paracentesis 02/2018 with 8L off.  - Paracentesis 04/04/18 with 10 L off - Schedule RUQ [place] to look for cirrhosis/portal HTN  6. Afib - new as of 01/02/18  - Rate controlled. - Continue eliquis 5 mg BID. Denies bleeding.   7. Hypokalemia - BMET today. K 4.5 on recheck 03/26/18 - Continue eplerenone 25 mg daily  Increase torsemide to 80 mg BID BMET today and in 7 days (recheck at PCP's office) Schedule RUQ [place] to assess for cirrhosis/portal HTN Follow up in 3 weeks APP side (pt requests >2 weeks for follow up), 6 weeks Dr Haroldine Laws   Georgiana Shore, NP 04/08/18   Greater than 50% of the 25 minute visit was spent in counseling/coordination of care regarding disease state education, salt/fluid restriction, sliding scale diuretics, and medication compliance.

## 2018-04-08 ENCOUNTER — Ambulatory Visit (HOSPITAL_COMMUNITY)
Admission: RE | Admit: 2018-04-08 | Discharge: 2018-04-08 | Disposition: A | Discharge status: Home / Self Care | Payer: BLUE CROSS/BLUE SHIELD | Source: Ambulatory Visit | Attending: Cardiology | Admitting: Cardiology

## 2018-04-08 ENCOUNTER — Encounter (HOSPITAL_COMMUNITY): Payer: Self-pay

## 2018-04-08 VITALS — BP 104/68 | HR 90 | Wt 179.0 lb

## 2018-04-08 DIAGNOSIS — E785 Hyperlipidemia, unspecified: Secondary | ICD-10-CM | POA: Insufficient documentation

## 2018-04-08 DIAGNOSIS — Z8249 Family history of ischemic heart disease and other diseases of the circulatory system: Secondary | ICD-10-CM | POA: Insufficient documentation

## 2018-04-08 DIAGNOSIS — I272 Pulmonary hypertension, unspecified: Secondary | ICD-10-CM | POA: Diagnosis not present

## 2018-04-08 DIAGNOSIS — I50810 Right heart failure, unspecified: Secondary | ICD-10-CM

## 2018-04-08 DIAGNOSIS — I5022 Chronic systolic (congestive) heart failure: Secondary | ICD-10-CM | POA: Diagnosis not present

## 2018-04-08 DIAGNOSIS — I480 Paroxysmal atrial fibrillation: Secondary | ICD-10-CM

## 2018-04-08 DIAGNOSIS — Z09 Encounter for follow-up examination after completed treatment for conditions other than malignant neoplasm: Secondary | ICD-10-CM | POA: Diagnosis not present

## 2018-04-08 DIAGNOSIS — N189 Chronic kidney disease, unspecified: Secondary | ICD-10-CM | POA: Diagnosis not present

## 2018-04-08 DIAGNOSIS — I25119 Atherosclerotic heart disease of native coronary artery with unspecified angina pectoris: Secondary | ICD-10-CM | POA: Diagnosis not present

## 2018-04-08 DIAGNOSIS — E876 Hypokalemia: Secondary | ICD-10-CM | POA: Diagnosis not present

## 2018-04-08 DIAGNOSIS — Z79899 Other long term (current) drug therapy: Secondary | ICD-10-CM | POA: Insufficient documentation

## 2018-04-08 DIAGNOSIS — I2510 Atherosclerotic heart disease of native coronary artery without angina pectoris: Secondary | ICD-10-CM | POA: Insufficient documentation

## 2018-04-08 DIAGNOSIS — Z79890 Hormone replacement therapy (postmenopausal): Secondary | ICD-10-CM | POA: Insufficient documentation

## 2018-04-08 DIAGNOSIS — Z823 Family history of stroke: Secondary | ICD-10-CM | POA: Diagnosis not present

## 2018-04-08 DIAGNOSIS — I4891 Unspecified atrial fibrillation: Secondary | ICD-10-CM | POA: Insufficient documentation

## 2018-04-08 DIAGNOSIS — R188 Other ascites: Secondary | ICD-10-CM | POA: Diagnosis not present

## 2018-04-08 DIAGNOSIS — Z7901 Long term (current) use of anticoagulants: Secondary | ICD-10-CM | POA: Insufficient documentation

## 2018-04-08 LAB — BASIC METABOLIC PANEL
ANION GAP: 12 (ref 5–15)
BUN: 87 mg/dL — ABNORMAL HIGH (ref 8–23)
CALCIUM: 9.1 mg/dL (ref 8.9–10.3)
CO2: 24 mmol/L (ref 22–32)
Chloride: 95 mmol/L — ABNORMAL LOW (ref 98–111)
Creatinine, Ser: 1.92 mg/dL — ABNORMAL HIGH (ref 0.61–1.24)
GFR calc non Af Amer: 35 mL/min — ABNORMAL LOW (ref >60)
GFR, EST AFRICAN AMERICAN: 41 mL/min — AB (ref >60)
Glucose, Bld: 154 mg/dL — ABNORMAL HIGH (ref 70–99)
Potassium: 2.7 mmol/L — CL (ref 3.5–5.1)
SODIUM: 131 mmol/L — AB (ref 135–145)

## 2018-04-08 MED ORDER — TORSEMIDE 20 MG PO TABS: 80.0000 mg | ORAL_TABLET | Freq: Two times a day (BID) | ORAL | 11 refills | Status: DC

## 2018-04-08 NOTE — Patient Instructions (Signed)
INCREASE Torsemide to 80 mg (4 tabs) twice daily.  Will schedule you for abdominal ultrasound at [facility].  Routine lab work today. Will notify you of abnormal results, otherwise no news is good news!  Go to PCP with prescription paper in 1 week for repeat labs.  Follow up 3 weeks with Cody [place] NP-C.  ______________________________________________________________________ Cody Schwartz Code:  Follow up 6-8 weeks with Dr. Haroldine Laws.  ______________________________________________________________________ Cody Schwartz Code:  Take all medication as prescribed the day of your appointment. Bring all medications with you to your appointment.  Do the following things EVERYDAY: 1) Weigh yourself in the morning before breakfast. Write it down and keep it in a log. 2) Take your medicines as prescribed 3) Eat low salt foods-Limit salt (sodium) to 2000 mg per day.  4) Stay as active as you can everyday 5) Limit all fluids for the day to less than 2 liters

## 2018-04-09 ENCOUNTER — Telehealth (HOSPITAL_COMMUNITY): Payer: Self-pay | Admitting: Vascular Surgery

## 2018-04-09 NOTE — Telephone Encounter (Signed)
Left pt message giving abd [place] appt @ [facility] @ 10:30, asked pt call back to confirm appt

## 2018-04-14 ENCOUNTER — Other Ambulatory Visit (HOSPITAL_COMMUNITY): Payer: Self-pay | Admitting: *Deleted

## 2018-04-14 ENCOUNTER — Ambulatory Visit (HOSPITAL_COMMUNITY)
Admission: RE | Admit: 2018-04-14 | Discharge: 2018-04-14 | Disposition: A | Discharge status: Home / Self Care | Payer: BLUE CROSS/BLUE SHIELD | Source: Ambulatory Visit | Attending: Internal Medicine | Admitting: Internal Medicine

## 2018-04-14 ENCOUNTER — Ambulatory Visit (HOSPITAL_COMMUNITY)
Admission: RE | Admit: 2018-04-14 | Discharge: 2018-04-14 | Disposition: A | Discharge status: Home / Self Care | Payer: BLUE CROSS/BLUE SHIELD | Source: Ambulatory Visit | Attending: Cardiology | Admitting: Cardiology

## 2018-04-14 DIAGNOSIS — I5022 Chronic systolic (congestive) heart failure: Secondary | ICD-10-CM

## 2018-04-14 DIAGNOSIS — R188 Other ascites: Secondary | ICD-10-CM

## 2018-04-14 DIAGNOSIS — K7460 Unspecified cirrhosis of liver: Secondary | ICD-10-CM | POA: Insufficient documentation

## 2018-04-14 DIAGNOSIS — K8020 Calculus of gallbladder without cholecystitis without obstruction: Secondary | ICD-10-CM | POA: Diagnosis not present

## 2018-04-14 LAB — BASIC METABOLIC PANEL
ANION GAP: 13 (ref 5–15)
BUN: 94 mg/dL — ABNORMAL HIGH (ref 8–23)
CO2: 21 mmol/L — AB (ref 22–32)
Calcium: 9.1 mg/dL (ref 8.9–10.3)
Chloride: 96 mmol/L — ABNORMAL LOW (ref 98–111)
Creatinine, Ser: 2.03 mg/dL — ABNORMAL HIGH (ref 0.61–1.24)
GFR calc Af Amer: 38 mL/min — ABNORMAL LOW (ref >60)
GFR, EST NON AFRICAN AMERICAN: 33 mL/min — AB (ref >60)
GLUCOSE: 122 mg/dL — AB (ref 70–99)
POTASSIUM: 3.3 mmol/L — AB (ref 3.5–5.1)
Sodium: 130 mmol/L — ABNORMAL LOW (ref 135–145)

## 2018-04-15 ENCOUNTER — Other Ambulatory Visit: Payer: Self-pay | Admitting: Cardiology

## 2018-04-15 ENCOUNTER — Telehealth: Payer: Self-pay | Admitting: Cardiology

## 2018-04-15 ENCOUNTER — Other Ambulatory Visit: Payer: Self-pay | Admitting: *Deleted

## 2018-04-15 DIAGNOSIS — I5022 Chronic systolic (congestive) heart failure: Secondary | ICD-10-CM

## 2018-04-15 MED ORDER — POTASSIUM CHLORIDE 40 MEQ/15ML (20%) PO SOLN: 15.0000 mL | Freq: Three times a day (TID) | ORAL | 3 refills | Status: DC

## 2018-04-15 NOTE — Telephone Encounter (Signed)
Please advise if you would like CHF clinic to follow or are you ok will refilling this medication? Please advise and c/c Elmyra Ricks w/response.

## 2018-04-15 NOTE — Telephone Encounter (Signed)
Need a refill of potassium chloride [facility] sent a [facility] worth last time

## 2018-04-17 NOTE — Telephone Encounter (Signed)
Rx sent in by Dr Bettina Gavia on 04-15-18

## 2018-04-23 ENCOUNTER — Other Ambulatory Visit (HOSPITAL_COMMUNITY): Payer: Self-pay | Admitting: *Deleted

## 2018-04-23 ENCOUNTER — Ambulatory Visit (HOSPITAL_COMMUNITY)
Admission: RE | Admit: 2018-04-23 | Discharge: 2018-04-23 | Disposition: A | Discharge status: Home / Self Care | Payer: BLUE CROSS/BLUE SHIELD | Source: Ambulatory Visit | Attending: Cardiology | Admitting: Cardiology

## 2018-04-23 DIAGNOSIS — I5022 Chronic systolic (congestive) heart failure: Secondary | ICD-10-CM

## 2018-04-23 LAB — BASIC METABOLIC PANEL
ANION GAP: 14 (ref 5–15)
BUN: 82 mg/dL — ABNORMAL HIGH (ref 8–23)
CO2: 21 mmol/L — ABNORMAL LOW (ref 22–32)
CREATININE: 1.96 mg/dL — AB (ref 0.61–1.24)
Calcium: 8.5 mg/dL — ABNORMAL LOW (ref 8.9–10.3)
Chloride: 92 mmol/L — ABNORMAL LOW (ref 98–111)
GFR, EST AFRICAN AMERICAN: 40 mL/min — AB (ref >60)
GFR, EST NON AFRICAN AMERICAN: 34 mL/min — AB (ref >60)
GLUCOSE: 141 mg/dL — AB (ref 70–99)
Potassium: 2.8 mmol/L — ABNORMAL LOW (ref 3.5–5.1)
Sodium: 127 mmol/L — ABNORMAL LOW (ref 135–145)

## 2018-04-24 ENCOUNTER — Telehealth (HOSPITAL_COMMUNITY): Payer: Self-pay | Admitting: Cardiology

## 2018-04-24 DIAGNOSIS — I5022 Chronic systolic (congestive) heart failure: Secondary | ICD-10-CM

## 2018-04-24 MED ORDER — POTASSIUM CHLORIDE 40 MEQ/15ML (20%) PO SOLN: ORAL | 3 refills | Status: DC

## 2018-04-24 NOTE — Telephone Encounter (Signed)
Notes recorded by Kerry Dory, CMA on 04/24/2018 at 2:41 PM EDT Patient aware. Patient voiced understanding   ------  Notes recorded by Georgiana Shore, NP on 04/23/2018 at 12:00 PM EDT His potassium is still very low at 2.8. Creatinine is stable. Please have him increase his daily potassium to 60 meq TID with additional 40 meq on MWF with metolazone. We will recheck at his follow up on Tuesday. Thanks

## 2018-04-28 NOTE — Progress Notes (Signed)
Advanced Heart Failure Clinic Note   Referring Physician: Dr Bettina Gavia PCP: [name]., MD PCP-Cardiologist: Dr Bettina Gavia HF: Dr Haroldine Laws   HPI: Cody Schwartz is a 62 y.o. male with chronic systolic failure due to ICM, CAD s/p PCI (most recent 09/2015), CKD, ascites requiring paracentesis, moderate pulmonary HTN referred by Dr. Bettina Gavia for further evaluation of HF and pulmonary HTN.[name]  He last saw Dr Bettina Gavia on 12/09/17. He has had progressive worsening of symptoms with weight gain and abdominal distension. Has required paracentesis with high-volume taps.  He presents today for follow up. Last visit torsemide increased, and has since had potassium adjusted. He has been feeling good except for the past 3 days. Having abdominal comfort with increased distention. He has wounds on both legs that are weeping clear liquid. He says they are getting better, but on exam his R sock is wet to the touch. He is due paracentesis 05/01/18. He says he has a "standing order" and calls to schedule them every 4-5 weeks. No bleeding on eliquis. Denies dizziness, CP, or palpitations. He denies any SOB or orthopnea. Taking all medications as directed.   Lives with his wife. No tobacco, ETOH, or drug use. Works full time as a Theme park manager.  cMRI 02/13/18 LVEF 17%, Severe LAE and RAE, Moderate MR. Findings consistent with NICM (Non-infiltrative, non-inflammatory)  Echo 09/2015: (not available in echo system to review) Severely reduced overall LV systolic function. Estimated LV ejection fraction 25% There is global hypokinesis with akinesis of the anteroseptum, distal anterior wall, and apex. Indeterminate LV diastolic function. Mild global RV hypokinesis. Biatrial dilation. Mild mitral regurgitation Mild tricuspid regurgitation  Echo 11/2016: EF 20-25% (per Dr Oren Binet note) Echo 01/22/18: EF 15% with severe RV dysfunction  Moderate MR Personally reviewed  LHC 10/12/2015: 2 vessel CAD - LAD occluded at old stent site, de  novo RCA lesion Successful PCI / Xience Drug Eluting Stent of the mid Right Coronary Artery. Successful PCI / PTCA of the distal Left Anterior Descending Coronary Artery.  [place] 10/12/2015: Hemodynamics (mmHg) RA mean 24 RV 59/17 PA 62/34 PCWP 33 AO 113 Cardiac Output (Thermo) 2.98 Cardiac Index (Thermo) 1.4  PYP 01/17/18: Visual and quantitative assessment (grade 1, H/CLL equal 1.45) are borderline values but concerning for transthyretin amyloidosis.  R/LHC 01/10/18: Ao = 82/59 (69) LV = 87/17 RA =  13 RV = 46/13 PA = 48/23 (33) PCW = 21 Fick cardiac output/index = 3.2/1.7 PVR = 3.9 WU Aosat = 96% PA sat = 51%, 51% Assessment: 1. 3v CAD with patent stents (LAD x 2), OM2, RCA 2. There is a 60-70% lesion int the mid LAD prior to previously placed stent. This is not flow-limiting. The apical LAD is occluded chronically 3. EF 25% by ECHO  4. Mild to moderately elevated filling pressures with low cardiac output Plan/Discussion: CAD is stable. RHC indicates a mixed picture but suspect that RV failure is likely the biggest limiting factor. PA pressures only mildly elevated. Unclear at this point if this represents burned out [place] or primary RV failure. Will get cardiac MRI to further assess.  Review of systems complete and found to be negative unless listed in HPI.    Past Medical History:  Diagnosis Date  . Hyperlipidemia   . Hypokalemia     Current Outpatient Medications  Medication Sig Dispense Refill  . apixaban (ELIQUIS) 5 MG TABS tablet Take 1 tablet (5 mg total) by mouth 2 (two) times daily. 60 tablet 3  . eplerenone (INSPRA) 25  MG tablet Take 1 tablet (25 mg total) by mouth daily. 90 tablet 3  . metolazone (ZAROXOLYN) 5 MG tablet Take 1 tablet (5 mg total) by mouth 3 (three) times a week. Every Monday, Wed and Friday 15 tablet 3  . midodrine (PROAMATINE) 5 MG tablet Take 1 tablet (5 mg total) by mouth 3 (three) times daily with meals. 90 tablet 3  . Potassium Chloride 40  MEQ/15ML (20%) SOLN Take 22.5 mLs by mouth 3 (three) times daily AND 15 mLs 3 (three) times a week. MON, WED, FRI. 4050 mL 3  . pravastatin (PRAVACHOL) 40 MG tablet Take 1 tablet (40 mg total) by mouth daily. 30 tablet 11  . torsemide (DEMADEX) 20 MG tablet Take 4 tablets (80 mg total) by mouth 2 (two) times daily. 240 tablet 11  . allopurinol (ZYLOPRIM) 100 MG tablet Take 100 mg by mouth daily.  12  . allopurinol (ZYLOPRIM) 300 MG tablet Take 300 mg by mouth daily.  12  . levothyroxine (SYNTHROID, LEVOTHROID) 125 MCG tablet TAKE 125 MCG BY MOUTH DAILY  3  . nitroGLYCERIN (NITROSTAT) 0.4 MG SL tablet Place 1 tablet (0.4 mg total) under the tongue every 5 (five) minutes as needed for chest pain. (Patient not taking: Reported on 04/29/2018) 25 tablet 2  . Vitamin D, Ergocalciferol, (DRISDOL) 50000 units CAPS capsule Take 50,000 Units by mouth every 7 (seven) days. On Mondays     No current facility-administered medications for this encounter.     Allergies  Allergen Reactions  . Spironolactone Hives      Social History   Socioeconomic History  . Marital status: Married    Spouse name: Not on file  . Number of children: Not on file  . Years of education: Not on file  . Highest education level: Not on file  Occupational History  . Not on file  Social Needs  . Financial resource strain: Not on file  . Food insecurity:    Worry: Not on file    Inability: Not on file  . Transportation needs:    Medical: Not on file    Non-medical: Not on file  Tobacco Use  . Smoking status: Never Smoker  . Smokeless tobacco: Never Used  Substance and Sexual Activity  . Alcohol use: No    Frequency: Never  . Drug use: Not on file  . Sexual activity: Not on file  Lifestyle  . Physical activity:    Days per week: Not on file    Minutes per session: Not on file  . Stress: Not on file  Relationships  . Social connections:    Talks on phone: Not on file    Gets together: Not on file    Attends  religious service: Not on file    Active member of club or organization: Not on file    Attends meetings of clubs or organizations: Not on file    Relationship status: Not on file  . Intimate partner violence:    Fear of current or ex partner: Not on file    Emotionally abused: Not on file    Physically abused: Not on file    Forced sexual activity: Not on file  Other Topics Concern  . Not on file  Social History Narrative  . Not on file      Family History  Problem Relation Age of Onset  . CAD Paternal Grandmother   . Stroke Paternal Grandfather   . CAD Paternal Grandfather  Vitals:   04/29/18 1138  BP: 90/64  Pulse: 94  SpO2: 96%  Weight: 199 lb 3.2 oz (90.4 kg)   Wt Readings from Last 3 Encounters:  04/29/18 199 lb 3.2 oz (90.4 kg)  04/08/18 179 lb (81.2 kg)  03/19/18 185 lb (83.9 kg)   PHYSICAL EXAM: General:Chronically ill appear. No resp difficulty. HEENT: Normal Neck: Supple. JVP 9-10 cm. Carotids 2+ bilat; no bruits. No thyromegaly or nodule noted. Cor: PMI nondisplaced. IRR. + RV lift, 2/6 TR.  Lungs: CTAB, normal effort. Abdomen: Markedly distended, tight. No HSM appreciable. No bruits or masses appreciable. +BS  Extremities: No cyanosis, clubbing, or rash. 2-3+ edema into thighs with bilateral "water blisters" R>L. Left thigh with area of confluence (Viewable in Media) Neuro: Alert & orientedx3, cranial nerves grossly intact. moves all 4 extremities w/o difficulty. Affect pleasant   ASSESSMENT & PLAN:  1. Chronic biventricular HF due to ICM. R/LHC 09/2015:  - PCI mid RCA, PCI to distal LAD, PA 62/34, PCWP 33, Thermo CO 2.98, CI 2.98, Echo 11/2016: EF 20-25% (per note, results unavailable), Declined ICD in past.  - Now with progressive RV failure symptoms. [place] 01/10/18: elevated filling pressures with low cardiac output. PVR 3.9. PYP scan grade 1, CLL 1.45. Sed rate, HIV, ANA, ANCA negative.  - Echo 01/22/18: EF 15-20%, severe biventricular failure.  -  cMRI 02/13/18 LVEF 17%, RVEF 26% (Mod/Sev reduced), Severe LAE and RAE, Moderate MR. Findings consistent with NICM (Non-infiltrative, non-inflammatory) - NYHA II-III symptoms by his report. Though suspect he is over-estimating.  - Volume status markedly elevated in setting of severe ascites.   - Continue torsemide 80 mg BID for now. He had good response with metolazone yesterday, so continue.  - Continue metolazone 3x/week on MWF with extra 40 meq potassium. BMET today.  - Continue eplerenone 25 mg daily - Continue midodrine 5 mg TID for now.  - Intolerant to BB in the past.  - Genetic testing negative for TTR.  2. CAD. LHC 4/19 with stable CAD - No s/s of ischemia.    - Continue statin - Not on ASA or plavix with eliquis  3. Pulm HTN with PA 62/34, wedge 33 on RHC 2017 - RHC with mildly elevated PA pressures (48/23), unclear is represents burned out PAH or primary RV failure.  - Will need to get PFTs and VQ scan at some point - No change.   4. CKD - BMET today.   5. Ascites - Has had multiple paracenteses this year with > 8L off.  - Most recent Paracentesis 04/04/18 with 10 L off - RUQ [place] 04/14/18 with cirrhotic liver and ascites.  - Scheduled for repeat paracentesis 05/01/18 - Strongly encouraged him to move his schedule up from every 4-5 weeks to 3 weeks.   6. Afib - new as of 01/02/18  - Rate controlled. - Continue eliquis 5 mg BID. Denies bleeding.   7. Hypokalemia - BMET today. K 2.8 04/23/18. - Continue eplerenone 25 mg daily  8. BLE leg wounds - With clear drainage bilaterally, in multiple areas.  - Will have [facility] out to assess and apply UNNA boots.   Labs today. RTC 3 weeks. Sooner with symptoms. He refused admission today.   Shirley Friar, PA-C  04/29/18   Greater than 50% of the 30 minute visit was spent in counseling/coordination of care regarding disease state education, salt/fluid restriction, sliding scale diuretics, and medication compliance.

## 2018-04-29 ENCOUNTER — Ambulatory Visit (HOSPITAL_COMMUNITY)
Admission: RE | Admit: 2018-04-29 | Discharge: 2018-04-29 | Disposition: A | Discharge status: Home / Self Care | Payer: BLUE CROSS/BLUE SHIELD | Source: Ambulatory Visit | Attending: Cardiology | Admitting: Cardiology

## 2018-04-29 ENCOUNTER — Encounter (HOSPITAL_COMMUNITY): Payer: Self-pay

## 2018-04-29 VITALS — BP 90/64 | HR 94 | Wt 199.2 lb

## 2018-04-29 DIAGNOSIS — I420 Dilated cardiomyopathy: Secondary | ICD-10-CM | POA: Diagnosis not present

## 2018-04-29 DIAGNOSIS — I5032 Chronic diastolic (congestive) heart failure: Secondary | ICD-10-CM | POA: Diagnosis not present

## 2018-04-29 DIAGNOSIS — L24A9 Irritant contact dermatitis due friction or contact with other specified body fluids: Secondary | ICD-10-CM

## 2018-04-29 DIAGNOSIS — I5023 Acute on chronic systolic (congestive) heart failure: Secondary | ICD-10-CM | POA: Diagnosis present

## 2018-04-29 DIAGNOSIS — Z8249 Family history of ischemic heart disease and other diseases of the circulatory system: Secondary | ICD-10-CM | POA: Insufficient documentation

## 2018-04-29 DIAGNOSIS — Z955 Presence of coronary angioplasty implant and graft: Secondary | ICD-10-CM | POA: Insufficient documentation

## 2018-04-29 DIAGNOSIS — T148XXA Other injury of unspecified body region, initial encounter: Secondary | ICD-10-CM

## 2018-04-29 DIAGNOSIS — I5082 Biventricular heart failure: Secondary | ICD-10-CM | POA: Insufficient documentation

## 2018-04-29 DIAGNOSIS — I5022 Chronic systolic (congestive) heart failure: Secondary | ICD-10-CM

## 2018-04-29 DIAGNOSIS — Z79890 Hormone replacement therapy (postmenopausal): Secondary | ICD-10-CM | POA: Diagnosis not present

## 2018-04-29 DIAGNOSIS — I4891 Unspecified atrial fibrillation: Secondary | ICD-10-CM | POA: Insufficient documentation

## 2018-04-29 DIAGNOSIS — X58XXXA Exposure to other specified factors, initial encounter: Secondary | ICD-10-CM | POA: Diagnosis not present

## 2018-04-29 DIAGNOSIS — E785 Hyperlipidemia, unspecified: Secondary | ICD-10-CM | POA: Insufficient documentation

## 2018-04-29 DIAGNOSIS — Z7901 Long term (current) use of anticoagulants: Secondary | ICD-10-CM | POA: Diagnosis not present

## 2018-04-29 DIAGNOSIS — E876 Hypokalemia: Secondary | ICD-10-CM | POA: Diagnosis not present

## 2018-04-29 DIAGNOSIS — Z79899 Other long term (current) drug therapy: Secondary | ICD-10-CM | POA: Diagnosis not present

## 2018-04-29 DIAGNOSIS — K7460 Unspecified cirrhosis of liver: Secondary | ICD-10-CM | POA: Diagnosis not present

## 2018-04-29 DIAGNOSIS — R188 Other ascites: Secondary | ICD-10-CM | POA: Diagnosis not present

## 2018-04-29 DIAGNOSIS — I25119 Atherosclerotic heart disease of native coronary artery with unspecified angina pectoris: Secondary | ICD-10-CM

## 2018-04-29 DIAGNOSIS — I480 Paroxysmal atrial fibrillation: Secondary | ICD-10-CM

## 2018-04-29 DIAGNOSIS — S81801A Unspecified open wound, right lower leg, initial encounter: Secondary | ICD-10-CM | POA: Insufficient documentation

## 2018-04-29 DIAGNOSIS — I2510 Atherosclerotic heart disease of native coronary artery without angina pectoris: Secondary | ICD-10-CM | POA: Diagnosis not present

## 2018-04-29 DIAGNOSIS — M7989 Other specified soft tissue disorders: Secondary | ICD-10-CM

## 2018-04-29 DIAGNOSIS — N189 Chronic kidney disease, unspecified: Secondary | ICD-10-CM | POA: Insufficient documentation

## 2018-04-29 DIAGNOSIS — S81802A Unspecified open wound, left lower leg, initial encounter: Secondary | ICD-10-CM | POA: Diagnosis not present

## 2018-04-29 DIAGNOSIS — I272 Pulmonary hypertension, unspecified: Secondary | ICD-10-CM | POA: Insufficient documentation

## 2018-04-29 LAB — BASIC METABOLIC PANEL
ANION GAP: 12 (ref 5–15)
BUN: 94 mg/dL — ABNORMAL HIGH (ref 8–23)
CO2: 20 mmol/L — AB (ref 22–32)
CREATININE: 2.32 mg/dL — AB (ref 0.61–1.24)
Calcium: 8.9 mg/dL (ref 8.9–10.3)
Chloride: 98 mmol/L (ref 98–111)
GFR, EST AFRICAN AMERICAN: 32 mL/min — AB (ref >60)
GFR, EST NON AFRICAN AMERICAN: 28 mL/min — AB (ref >60)
Glucose, Bld: 113 mg/dL — ABNORMAL HIGH (ref 70–99)
Potassium: 3.2 mmol/L — ABNORMAL LOW (ref 3.5–5.1)
Sodium: 130 mmol/L — ABNORMAL LOW (ref 135–145)

## 2018-05-01 DIAGNOSIS — R188 Other ascites: Secondary | ICD-10-CM | POA: Diagnosis not present

## 2018-05-23 DIAGNOSIS — R188 Other ascites: Secondary | ICD-10-CM | POA: Diagnosis not present

## 2018-05-27 ENCOUNTER — Telehealth (HOSPITAL_COMMUNITY): Payer: Self-pay | Admitting: *Deleted

## 2018-05-27 NOTE — Telephone Encounter (Signed)
[facility] states they can not start care for pt b/c he is not homebound and he is still going to work daily.  She states she advised him to call wound care center for appt for leg wraps and pt was going to call them.

## 2018-06-11 ENCOUNTER — Ambulatory Visit (HOSPITAL_COMMUNITY)
Admission: RE | Admit: 2018-06-11 | Discharge: 2018-06-11 | Disposition: A | Discharge status: Home / Self Care | Payer: BLUE CROSS/BLUE SHIELD | Source: Ambulatory Visit | Attending: Internal Medicine | Admitting: Internal Medicine

## 2018-06-11 ENCOUNTER — Other Ambulatory Visit: Payer: Self-pay

## 2018-06-11 ENCOUNTER — Encounter (HOSPITAL_COMMUNITY): Payer: Self-pay | Admitting: Internal Medicine

## 2018-06-11 VITALS — BP 88/58 | HR 93 | Wt 186.4 lb

## 2018-06-11 DIAGNOSIS — N189 Chronic kidney disease, unspecified: Secondary | ICD-10-CM | POA: Insufficient documentation

## 2018-06-11 DIAGNOSIS — I255 Ischemic cardiomyopathy: Secondary | ICD-10-CM | POA: Insufficient documentation

## 2018-06-11 DIAGNOSIS — I4891 Unspecified atrial fibrillation: Secondary | ICD-10-CM | POA: Insufficient documentation

## 2018-06-11 DIAGNOSIS — I5022 Chronic systolic (congestive) heart failure: Secondary | ICD-10-CM | POA: Diagnosis not present

## 2018-06-11 DIAGNOSIS — Z79899 Other long term (current) drug therapy: Secondary | ICD-10-CM | POA: Insufficient documentation

## 2018-06-11 DIAGNOSIS — K7469 Other cirrhosis of liver: Secondary | ICD-10-CM | POA: Diagnosis not present

## 2018-06-11 DIAGNOSIS — E785 Hyperlipidemia, unspecified: Secondary | ICD-10-CM | POA: Insufficient documentation

## 2018-06-11 DIAGNOSIS — E876 Hypokalemia: Secondary | ICD-10-CM | POA: Diagnosis not present

## 2018-06-11 DIAGNOSIS — Z7901 Long term (current) use of anticoagulants: Secondary | ICD-10-CM | POA: Insufficient documentation

## 2018-06-11 DIAGNOSIS — I5082 Biventricular heart failure: Secondary | ICD-10-CM | POA: Diagnosis not present

## 2018-06-11 DIAGNOSIS — K7460 Unspecified cirrhosis of liver: Secondary | ICD-10-CM | POA: Insufficient documentation

## 2018-06-11 DIAGNOSIS — I50810 Right heart failure, unspecified: Secondary | ICD-10-CM | POA: Diagnosis not present

## 2018-06-11 DIAGNOSIS — R188 Other ascites: Secondary | ICD-10-CM | POA: Insufficient documentation

## 2018-06-11 DIAGNOSIS — I2510 Atherosclerotic heart disease of native coronary artery without angina pectoris: Secondary | ICD-10-CM | POA: Diagnosis not present

## 2018-06-11 DIAGNOSIS — Z955 Presence of coronary angioplasty implant and graft: Secondary | ICD-10-CM | POA: Diagnosis not present

## 2018-06-11 DIAGNOSIS — I272 Pulmonary hypertension, unspecified: Secondary | ICD-10-CM | POA: Diagnosis not present

## 2018-06-11 DIAGNOSIS — Z888 Allergy status to other drugs, medicaments and biological substances status: Secondary | ICD-10-CM | POA: Diagnosis not present

## 2018-06-11 DIAGNOSIS — Z79890 Hormone replacement therapy (postmenopausal): Secondary | ICD-10-CM | POA: Insufficient documentation

## 2018-06-11 LAB — COMPREHENSIVE METABOLIC PANEL
ALBUMIN: 3.1 g/dL — AB (ref 3.5–5.0)
ALT: 21 U/L (ref 0–44)
AST: 38 U/L (ref 15–41)
Alkaline Phosphatase: 142 U/L — ABNORMAL HIGH (ref 38–126)
Anion gap: 12 (ref 5–15)
BUN: 77 mg/dL — ABNORMAL HIGH (ref 8–23)
CHLORIDE: 96 mmol/L — AB (ref 98–111)
CO2: 21 mmol/L — ABNORMAL LOW (ref 22–32)
Calcium: 8.8 mg/dL — ABNORMAL LOW (ref 8.9–10.3)
Creatinine, Ser: 2.13 mg/dL — ABNORMAL HIGH (ref 0.61–1.24)
GFR calc Af Amer: 36 mL/min — ABNORMAL LOW (ref >60)
GFR calc non Af Amer: 31 mL/min — ABNORMAL LOW (ref >60)
GLUCOSE: 146 mg/dL — AB (ref 70–99)
POTASSIUM: 3.9 mmol/L (ref 3.5–5.1)
SODIUM: 129 mmol/L — AB (ref 135–145)
Total Bilirubin: 1.2 mg/dL (ref 0.3–1.2)
Total Protein: 6.5 g/dL (ref 6.5–8.1)

## 2018-06-11 MED ORDER — MIDODRINE HCL 10 MG PO TABS: 10.0000 mg | ORAL_TABLET | Freq: Three times a day (TID) | ORAL | 3 refills | Status: DC

## 2018-06-11 MED ORDER — TORSEMIDE 20 MG PO TABS: ORAL_TABLET | ORAL | 11 refills | Status: DC

## 2018-06-11 MED ORDER — EPLERENONE 25 MG PO TABS: 25.0000 mg | ORAL_TABLET | Freq: Two times a day (BID) | ORAL | 3 refills | Status: DC

## 2018-06-11 NOTE — Patient Instructions (Signed)
Increase Torsemide to 100 mg (5 tabs) in AM and 80 mg (4 tabs) in PM  Increase Eplerenone to 25 mg Twice daily   Increase Midodrine to 10 mg Three times a day   Labs today  Labs in 1 week  You have been referred to [place], they will call you to schedule  Your physician recommends that you schedule a follow-up appointment in: 1 month

## 2018-06-11 NOTE — Progress Notes (Signed)
Advanced Heart Failure Clinic Note   Referring Physician: Dr Bettina Gavia PCP: [name]., MD PCP-Cardiologist: Dr Bettina Gavia HF: Dr Haroldine Laws   HPI: Cody Schwartz is a 62 y.o. male with chronic systolic failure due to ICM, CAD s/p PCI (most recent 09/2015), CKD, ascites requiring paracentesis, moderate pulmonary HTN referred by Dr. Bettina Gavia for further evaluation of HF and pulmonary HTN.[name]  He last saw Dr Bettina Gavia on 12/09/17. He has had progressive worsening of symptoms with weight gain and abdominal distension. Has required paracentesis with high-volume taps.  He presents today for follow up. Taking torsemide 80 bid and metolazone on M/W/F. Says he is having much better luck with fluid management lately. No paracentesis in 3 weeks. SBP runs usually in 90s. Able to walk for 61mins at a time at a slow steady pace. Mild orthopnea. No PND.   Lives with his wife. No tobacco, ETOH, or drug use. Works full time as a Theme park manager.  cMRI 02/13/18 LVEF 17%, Severe LAE and RAE, Moderate MR. Findings consistent with NICM (Non-infiltrative, non-inflammatory)  Echo 09/2015: (not available in echo system to review) Severely reduced overall LV systolic function. Estimated LV ejection fraction 25% There is global hypokinesis with akinesis of the anteroseptum, distal anterior wall, and apex. Indeterminate LV diastolic function. Mild global RV hypokinesis. Biatrial dilation. Mild mitral regurgitation Mild tricuspid regurgitation  Echo 11/2016: EF 20-25% (per Dr Oren Binet note) Echo 01/22/18: EF 15% with severe RV dysfunction  Moderate MR Personally reviewed  LHC 10/12/2015: 2 vessel CAD - LAD occluded at old stent site, de novo RCA lesion Successful PCI / Xience Drug Eluting Stent of the mid Right Coronary Artery. Successful PCI / PTCA of the distal Left Anterior Descending Coronary Artery.  [place] 10/12/2015: Hemodynamics (mmHg) RA mean 24 RV 59/17 PA 62/34 PCWP 33 AO 113 Cardiac Output (Thermo) 2.98 Cardiac  Index (Thermo) 1.4  PYP 01/17/18: Visual and quantitative assessment (grade 1, H/CLL equal 1.45) are borderline values but concerning for transthyretin amyloidosis.  R/LHC 01/10/18: Ao = 82/59 (69) LV = 87/17 RA =  13 RV = 46/13 PA = 48/23 (33) PCW = 21 Fick cardiac output/index = 3.2/1.7 PVR = 3.9 WU Aosat = 96% PA sat = 51%, 51% Assessment: 1. 3v CAD with patent stents (LAD x 2), OM2, RCA 2. There is a 60-70% lesion int the mid LAD prior to previously placed stent. This is not flow-limiting. The apical LAD is occluded chronically 3. EF 25% by ECHO  4. Mild to moderately elevated filling pressures with low cardiac output Plan/Discussion: CAD is stable. RHC indicates a mixed picture but suspect that RV failure is likely the biggest limiting factor. PA pressures only mildly elevated. Unclear at this point if this represents burned out [place] or primary RV failure. Will get cardiac MRI to further assess.  Review of systems complete and found to be negative unless listed in HPI.    Past Medical History:  Diagnosis Date  . Hyperlipidemia   . Hypokalemia     Current Outpatient Medications  Medication Sig Dispense Refill  . allopurinol (ZYLOPRIM) 100 MG tablet Take 100 mg by mouth daily.  12  . allopurinol (ZYLOPRIM) 300 MG tablet Take 300 mg by mouth daily.  12  . apixaban (ELIQUIS) 5 MG TABS tablet Take 1 tablet (5 mg total) by mouth 2 (two) times daily. 60 tablet 3  . eplerenone (INSPRA) 25 MG tablet Take 1 tablet (25 mg total) by mouth daily. 90 tablet 3  . levothyroxine (SYNTHROID, LEVOTHROID)  125 MCG tablet TAKE 125 MCG BY MOUTH DAILY  3  . metolazone (ZAROXOLYN) 5 MG tablet Take 1 tablet (5 mg total) by mouth 3 (three) times a week. Every Monday, Wed and Friday 15 tablet 3  . midodrine (PROAMATINE) 5 MG tablet Take 1 tablet (5 mg total) by mouth 3 (three) times daily with meals. 90 tablet 3  . nitroGLYCERIN (NITROSTAT) 0.4 MG SL tablet Place 1 tablet (0.4 mg total) under the  tongue every 5 (five) minutes as needed for chest pain. 25 tablet 2  . Potassium Chloride 40 MEQ/15ML (20%) SOLN Take 22.5 mLs by mouth 3 (three) times daily AND 15 mLs 3 (three) times a week. MON, WED, FRI. 4050 mL 3  . pravastatin (PRAVACHOL) 40 MG tablet Take 1 tablet (40 mg total) by mouth daily. 30 tablet 11  . torsemide (DEMADEX) 20 MG tablet Take 4 tablets (80 mg total) by mouth 2 (two) times daily. 240 tablet 11  . Vitamin D, Ergocalciferol, (DRISDOL) 50000 units CAPS capsule Take 50,000 Units by mouth every 7 (seven) days. On Mondays     No current facility-administered medications for this encounter.     Allergies  Allergen Reactions  . Spironolactone Hives      Social History   Socioeconomic History  . Marital status: Married    Spouse name: Not on file  . Number of children: Not on file  . Years of education: Not on file  . Highest education level: Not on file  Occupational History  . Not on file  Social Needs  . Financial resource strain: Not on file  . Food insecurity:    Worry: Not on file    Inability: Not on file  . Transportation needs:    Medical: Not on file    Non-medical: Not on file  Tobacco Use  . Smoking status: Never Smoker  . Smokeless tobacco: Never Used  Substance and Sexual Activity  . Alcohol use: No    Frequency: Never  . Drug use: Not on file  . Sexual activity: Not on file  Lifestyle  . Physical activity:    Days per week: Not on file    Minutes per session: Not on file  . Stress: Not on file  Relationships  . Social connections:    Talks on phone: Not on file    Gets together: Not on file    Attends religious service: Not on file    Active member of club or organization: Not on file    Attends meetings of clubs or organizations: Not on file    Relationship status: Not on file  . Intimate partner violence:    Fear of current or ex partner: Not on file    Emotionally abused: Not on file    Physically abused: Not on file     Forced sexual activity: Not on file  Other Topics Concern  . Not on file  Social History Narrative  . Not on file      Family History  Problem Relation Age of Onset  . CAD Paternal Grandmother   . Stroke Paternal Grandfather   . CAD Paternal Grandfather     Vitals:   06/11/18 1152  BP: (!) 88/58  Pulse: 93  SpO2: 100%  Weight: 84.5 kg (186 lb 6 oz)   Wt Readings from Last 3 Encounters:  06/11/18 84.5 kg (186 lb 6 oz)  04/29/18 90.4 kg (199 lb 3.2 oz)  04/08/18 81.2 kg (179 lb)  PHYSICAL EXAM: General:  Fatigued appearing. No resp difficulty HEENT: normal Neck: supple. JVP 10 Carotids 2+ bilat; no bruits. No lymphadenopathy or thryomegaly appreciated. Cor: PMI nondisplaced. Irregular rate & rhythm. 2/6 TR Lungs: clear Abdomen: soft, nontender, + distended. No hepatosplenomegaly. No bruits or masses. Good bowel sounds. Extremities: no cyanosis, clubbing, rash, 3+ edema Neuro: alert & orientedx3, cranial nerves grossly intact. moves all 4 extremities w/o difficulty. Affect pleasant   ASSESSMENT & PLAN:  1. Chronic biventricular HF due to ICM. R/LHC 09/2015:  - PCI mid RCA, PCI to distal LAD, PA 62/34, PCWP 33, Thermo CO 2.98, CI 2.98, Echo 11/2016: EF 20-25% (per note, results unavailable), Declined ICD in past.  - Now with progressive RV failure symptoms. [place] 01/10/18: elevated filling pressures with low cardiac output. PVR 3.9. Sed rate, HIV, ANA, ANCA negative.  - Echo 01/22/18: EF 15-20%, severe biventricular failure.  - cMRI 02/13/18 LVEF 17%, RVEF 26% (Mod/Sev reduced), Severe LAE and RAE, Moderate MR. Findings consistent with NICM (Non-infiltrative, non-inflammatory) - Stable NYHA III - Volume status remains elevated with R>L HF - Increase torsemide 100/80 mg BID for now.  - Continue metolazone 3x/week on MWF with extra 40 meq potassium. BMET today.  - Increase eplerenone to 25 bid continue to titrate as potassium tolerates. Would like to get him on at least 50 bid -  Increase midodrine 10 tid  - Intolerant to BB in the past.  - PYP scan 4/19 grade 1, but quantitative 1.45. In reviewing study I think it is concerning for TTR. Will repeat at 6 month mark  - Genetic testing negative for TTR.  2. CAD. LHC 4/19 with stable CAD - No s/s of ischemia.    - Continue statin - Not on ASA or plavix with eliquis  3. Pulm HTN with PA 62/34, wedge 33 on RHC 2017 - RHC with mildly elevated PA pressures (48/23), unclear is represents burned out PAH or primary RV failure.  - Will need to get PFTs and VQ scan at some point - No change.   4. CKD - BMET today.   5. Ascites - Has had multiple paracenteses this year with > 8L off.  - RUQ [place] 04/14/18 with cirrhotic liver and ascites.  - Scheduled for repeat paracentesis on Friday - Radiology has suggested indwelling tchecnkoff for home management. I agree with this - Refer to GI for cirrhosis evaluation   6. Afib - new as of 01/02/18  - Rate contolled - Continue eliquis 5 mg BID. Denies bleeding.   7. Hypokalemia - BMET today. - Increase eplerenone 25 mg bid  8. BLE leg wounds - Much improved    Glori Bickers, MD  06/11/18

## 2018-06-13 DIAGNOSIS — R188 Other ascites: Secondary | ICD-10-CM | POA: Diagnosis not present

## 2018-06-23 ENCOUNTER — Ambulatory Visit (HOSPITAL_COMMUNITY)
Admission: RE | Admit: 2018-06-23 | Discharge: 2018-06-23 | Disposition: A | Discharge status: Home / Self Care | Payer: BLUE CROSS/BLUE SHIELD | Source: Ambulatory Visit | Attending: Internal Medicine | Admitting: Internal Medicine

## 2018-06-23 DIAGNOSIS — I5022 Chronic systolic (congestive) heart failure: Secondary | ICD-10-CM | POA: Diagnosis not present

## 2018-06-23 LAB — COMPREHENSIVE METABOLIC PANEL
ALT: 19 U/L (ref 0–44)
AST: 37 U/L (ref 15–41)
Albumin: 3 g/dL — ABNORMAL LOW (ref 3.5–5.0)
Alkaline Phosphatase: 153 U/L — ABNORMAL HIGH (ref 38–126)
Anion gap: 15 (ref 5–15)
BUN: 73 mg/dL — AB (ref 8–23)
CO2: 18 mmol/L — ABNORMAL LOW (ref 22–32)
CREATININE: 2.48 mg/dL — AB (ref 0.61–1.24)
Calcium: 8.6 mg/dL — ABNORMAL LOW (ref 8.9–10.3)
Chloride: 93 mmol/L — ABNORMAL LOW (ref 98–111)
GFR calc non Af Amer: 26 mL/min — ABNORMAL LOW (ref >60)
GFR, EST AFRICAN AMERICAN: 30 mL/min — AB (ref >60)
Glucose, Bld: 88 mg/dL (ref 70–99)
Potassium: 4.4 mmol/L (ref 3.5–5.1)
Sodium: 126 mmol/L — ABNORMAL LOW (ref 135–145)
Total Bilirubin: 1.5 mg/dL — ABNORMAL HIGH (ref 0.3–1.2)
Total Protein: 6 g/dL — ABNORMAL LOW (ref 6.5–8.1)

## 2018-06-26 ENCOUNTER — Telehealth (HOSPITAL_COMMUNITY): Payer: Self-pay

## 2018-06-26 NOTE — Telephone Encounter (Signed)
Pt called with no answer voice mail left for pt to call back to get him scheduled for lab work per Dr. Haroldine Laws  Please repeat next week to follow sodium and creatinine

## 2018-07-01 ENCOUNTER — Encounter: Payer: Self-pay | Admitting: Gastroenterology

## 2018-07-03 ENCOUNTER — Telehealth (HOSPITAL_COMMUNITY): Payer: Self-pay

## 2018-07-03 NOTE — Telephone Encounter (Signed)
Pt called to get lab work scheduled pt has an appointment on the 18th voice mail left for pt to call us back to see if we can get lab work before his appointment

## 2018-07-07 DIAGNOSIS — R188 Other ascites: Secondary | ICD-10-CM | POA: Diagnosis not present

## 2018-07-10 ENCOUNTER — Ambulatory Visit: Payer: BLUE CROSS/BLUE SHIELD | Admitting: Gastroenterology

## 2018-07-11 ENCOUNTER — Other Ambulatory Visit: Payer: Self-pay

## 2018-07-11 ENCOUNTER — Other Ambulatory Visit: Payer: BLUE CROSS/BLUE SHIELD

## 2018-07-11 ENCOUNTER — Ambulatory Visit (HOSPITAL_COMMUNITY)
Admission: RE | Admit: 2018-07-11 | Discharge: 2018-07-11 | Disposition: A | Discharge status: Home / Self Care | Payer: BLUE CROSS/BLUE SHIELD | Source: Ambulatory Visit | Attending: Internal Medicine | Admitting: Internal Medicine

## 2018-07-11 ENCOUNTER — Telehealth: Payer: Self-pay

## 2018-07-11 ENCOUNTER — Encounter: Payer: Self-pay | Admitting: Gastroenterology

## 2018-07-11 ENCOUNTER — Ambulatory Visit: Payer: BLUE CROSS/BLUE SHIELD | Admitting: Gastroenterology

## 2018-07-11 ENCOUNTER — Encounter (HOSPITAL_COMMUNITY): Payer: Self-pay | Admitting: Internal Medicine

## 2018-07-11 VITALS — BP 98/72 | HR 75 | Ht 67.0 in | Wt 168.0 lb

## 2018-07-11 VITALS — BP 82/60 | HR 87 | Wt 170.4 lb

## 2018-07-11 DIAGNOSIS — I272 Pulmonary hypertension, unspecified: Secondary | ICD-10-CM | POA: Insufficient documentation

## 2018-07-11 DIAGNOSIS — Z79899 Other long term (current) drug therapy: Secondary | ICD-10-CM | POA: Insufficient documentation

## 2018-07-11 DIAGNOSIS — Z8249 Family history of ischemic heart disease and other diseases of the circulatory system: Secondary | ICD-10-CM | POA: Insufficient documentation

## 2018-07-11 DIAGNOSIS — I4891 Unspecified atrial fibrillation: Secondary | ICD-10-CM | POA: Diagnosis not present

## 2018-07-11 DIAGNOSIS — I2510 Atherosclerotic heart disease of native coronary artery without angina pectoris: Secondary | ICD-10-CM | POA: Diagnosis not present

## 2018-07-11 DIAGNOSIS — I5082 Biventricular heart failure: Secondary | ICD-10-CM | POA: Diagnosis not present

## 2018-07-11 DIAGNOSIS — Z7901 Long term (current) use of anticoagulants: Secondary | ICD-10-CM

## 2018-07-11 DIAGNOSIS — N184 Chronic kidney disease, stage 4 (severe): Secondary | ICD-10-CM | POA: Diagnosis not present

## 2018-07-11 DIAGNOSIS — R188 Other ascites: Secondary | ICD-10-CM

## 2018-07-11 DIAGNOSIS — Z955 Presence of coronary angioplasty implant and graft: Secondary | ICD-10-CM | POA: Insufficient documentation

## 2018-07-11 DIAGNOSIS — E785 Hyperlipidemia, unspecified: Secondary | ICD-10-CM | POA: Insufficient documentation

## 2018-07-11 DIAGNOSIS — I428 Other cardiomyopathies: Secondary | ICD-10-CM | POA: Diagnosis not present

## 2018-07-11 DIAGNOSIS — K7460 Unspecified cirrhosis of liver: Secondary | ICD-10-CM | POA: Diagnosis not present

## 2018-07-11 DIAGNOSIS — E854 Organ-limited amyloidosis: Secondary | ICD-10-CM | POA: Diagnosis not present

## 2018-07-11 DIAGNOSIS — I482 Chronic atrial fibrillation, unspecified: Secondary | ICD-10-CM

## 2018-07-11 DIAGNOSIS — I50814 Right heart failure due to left heart failure: Secondary | ICD-10-CM | POA: Diagnosis not present

## 2018-07-11 DIAGNOSIS — Z888 Allergy status to other drugs, medicaments and biological substances status: Secondary | ICD-10-CM | POA: Diagnosis not present

## 2018-07-11 DIAGNOSIS — Z1211 Encounter for screening for malignant neoplasm of colon: Secondary | ICD-10-CM | POA: Diagnosis not present

## 2018-07-11 DIAGNOSIS — Z79890 Hormone replacement therapy (postmenopausal): Secondary | ICD-10-CM | POA: Diagnosis not present

## 2018-07-11 DIAGNOSIS — I43 Cardiomyopathy in diseases classified elsewhere: Secondary | ICD-10-CM

## 2018-07-11 DIAGNOSIS — Z823 Family history of stroke: Secondary | ICD-10-CM | POA: Insufficient documentation

## 2018-07-11 DIAGNOSIS — I5022 Chronic systolic (congestive) heart failure: Secondary | ICD-10-CM | POA: Diagnosis not present

## 2018-07-11 DIAGNOSIS — E876 Hypokalemia: Secondary | ICD-10-CM | POA: Diagnosis not present

## 2018-07-11 LAB — IRON AND TIBC
Iron: 47 ug/dL (ref 45–182)
Saturation Ratios: 17 % — ABNORMAL LOW (ref 17.9–39.5)
TIBC: 274 ug/dL (ref 250–450)
UIBC: 227 ug/dL

## 2018-07-11 LAB — PROTIME-INR
INR: 1.44
Prothrombin Time: 17.4 seconds — ABNORMAL HIGH (ref 11.4–15.2)

## 2018-07-11 LAB — FERRITIN: Ferritin: 259 ng/mL (ref 24–336)

## 2018-07-11 LAB — CBC WITH DIFFERENTIAL/PLATELET
ABS IMMATURE GRANULOCYTES: 0.11 10*3/uL — AB (ref 0.00–0.07)
BASOS PCT: 1 %
Basophils Absolute: 0.1 10*3/uL (ref 0.0–0.1)
Eosinophils Absolute: 0.2 10*3/uL (ref 0.0–0.5)
Eosinophils Relative: 2 %
HEMATOCRIT: 33.1 % — AB (ref 39.0–52.0)
Hemoglobin: 11.4 g/dL — ABNORMAL LOW (ref 13.0–17.0)
Immature Granulocytes: 1 %
Lymphocytes Relative: 7 %
Lymphs Abs: 0.7 10*3/uL (ref 0.7–4.0)
MCH: 32.4 pg (ref 26.0–34.0)
MCHC: 34.4 g/dL (ref 30.0–36.0)
MCV: 94 fL (ref 80.0–100.0)
MONO ABS: 0.5 10*3/uL (ref 0.1–1.0)
MONOS PCT: 5 %
NEUTROS ABS: 8.6 10*3/uL — AB (ref 1.7–7.7)
Neutrophils Relative %: 84 %
PLATELETS: 221 10*3/uL (ref 150–400)
RBC: 3.52 MIL/uL — ABNORMAL LOW (ref 4.22–5.81)
RDW: 15.2 % (ref 11.5–15.5)
WBC: 10 10*3/uL (ref 4.0–10.5)
nRBC: 0 % (ref 0.0–0.2)

## 2018-07-11 LAB — COMPREHENSIVE METABOLIC PANEL
ALBUMIN: 3 g/dL — AB (ref 3.5–5.0)
ALK PHOS: 131 U/L — AB (ref 38–126)
ALT: 13 U/L (ref 0–44)
ANION GAP: 10 (ref 5–15)
AST: 22 U/L (ref 15–41)
BUN: 66 mg/dL — AB (ref 8–23)
CALCIUM: 8.4 mg/dL — AB (ref 8.9–10.3)
CO2: 17 mmol/L — AB (ref 22–32)
Chloride: 98 mmol/L (ref 98–111)
Creatinine, Ser: 2.03 mg/dL — ABNORMAL HIGH (ref 0.61–1.24)
GFR calc Af Amer: 38 mL/min — ABNORMAL LOW (ref >60)
GFR calc non Af Amer: 33 mL/min — ABNORMAL LOW (ref >60)
GLUCOSE: 156 mg/dL — AB (ref 70–99)
Potassium: 3.1 mmol/L — ABNORMAL LOW (ref 3.5–5.1)
SODIUM: 125 mmol/L — AB (ref 135–145)
Total Bilirubin: 1 mg/dL (ref 0.3–1.2)
Total Protein: 5.7 g/dL — ABNORMAL LOW (ref 6.5–8.1)

## 2018-07-11 LAB — HEPATITIS B SURFACE ANTIGEN: Hepatitis B Surface Ag: NEGATIVE

## 2018-07-11 LAB — IRON: Iron: 49 ug/dL (ref 45–182)

## 2018-07-11 NOTE — Addendum Note (Signed)
Addended by: Scarlette Calico on: 07/11/2018 03:36 PM   Modules accepted: Orders

## 2018-07-11 NOTE — Addendum Note (Signed)
Addended by: Scarlette Calico on: 07/11/2018 03:37 PM   Modules accepted: Orders

## 2018-07-11 NOTE — Progress Notes (Signed)
Chief Complaint: Ascites   Referring Provider:     Dr. 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 He has a history of CHF (TTE 01/2018 with EF of 15% and severe RV dysfunction) due to ICM, CAD S/P PCI with DES in 09/2015, CKD, moderate pulmonary hypertension referred to the GI clinic for evaluation of cirrhosis and ascites.   He has previously followed with Dr. Lyda Jester, GI in [place], [place].  While his ascites has certainly been treated with aggressive diuretics by Cardiology and serial paracentesis by IR, as outlined below, he does not believe that his cirrhosis has been evaluated by GI in the past.  Ascites started approx 24 months ago with progressive abdominal distention, and has been on multiple diuretics since then. Now, paracentesis every 4-5 weeks, starting approx 1 year ago. To the best of his knowledge, no "issues" with the fluid sent off in the past- no labs for review today.  Last paracentesis was 10 days ago with removal of 8L.  Was most recently seen by his cardiologist, Glori Bickers, MD, on 06/11/2018 with plan to increase torsemide to 100/80 mg twice daily, resume metolazone triweekly (M/W/F), add potassium, increase eplerenone to 25 twice daily (with goal 50 twice daily), increase Midrin to 10 mg 3 times daily.    Per EMR, has had multiple serial paracentesis over the year, including 12L removed in April 2019. Last paracentesis was 10 days ago with removal of 8L.  Most recent labs notable for sodium 126, CO2 18, BUN/creatinine 73/2.48, AST/ALT 37/19 with ALP 153, T bili 1.5, albumin 3.0, protein 6.0; with the exception of the uptrending creatinine from 1.96 (was 1.26 1-year ago and baseline seems to be in the 1.2's since 2016), all essentially stable from July.  Before INR 1.17, platelets 220, normal hemoglobin/hematocrit/WBC.  No previous nephrology evaluation.   No previous EGD.  No history of encephalopathy per patient and wife.  No prior GI bleed.  No history of SBP.  Otherwise, no prior history of viral hepatitis, blood transfusions, tattoos, high risk exposures.  No known personal or family history of hepatic or pancreaticobiliary disease.  Cirrhosis history: -Etiology: Unknown; extended serologic evaluation as below -Complications: Ascites requiring paracentesis; no SBP -Last EGD/variceal screening: Scheduling today -Vaccinations: Obtaining records -[place] screening: RUQ ultrasound 03/2018 with stable hyperechoic lesion in right liver lobe measuring 1.4 centimeters, presumed hemangioma.  Otherwise no new/suspicious lesions, diffusely heterogenous and coarsened echotexture with peripheral contours and nodularity consistent with cirrhosis, normal Doppler, ascites -MELD score: 25 -Child Pugh score: B (8 pts)  Intolerant to beta-blocker per Cards notes, but patient unsure about details regarding this (took Coreg for 2 years).  Stopped spironolactone due to urticaria.   Past Medical History:  Diagnosis Date  . Congestive heart failure (CHF) ([place])   . Hyperlipidemia   . Hypokalemia      Past Surgical History:  Procedure Laterality Date  . RIGHT/LEFT HEART CATH AND CORONARY ANGIOGRAPHY N/A 01/10/2018   Procedure: RIGHT/LEFT HEART CATH AND CORONARY ANGIOGRAPHY;  Surgeon: Jolaine Artist, MD;  Location: [facility];  Service: Cardiovascular;  Laterality: N/A;   Family History  Problem Relation Age of Onset  . CAD Paternal Grandmother   . Stroke Paternal Grandfather   . CAD Paternal Grandfather   . Colon cancer Neg Hx  Social History   Tobacco Use  . Smoking status: Never Smoker  . Smokeless tobacco: Never Used  Substance Use Topics  . Alcohol use: No    Frequency: Never  . Drug use: Never   Current Outpatient Medications  Medication Sig Dispense Refill  . allopurinol (ZYLOPRIM) 100 MG tablet Take 100 mg by mouth daily.  12    . allopurinol (ZYLOPRIM) 300 MG tablet Take 300 mg by mouth daily.  12  . apixaban (ELIQUIS) 5 MG TABS tablet Take 1 tablet (5 mg total) by mouth 2 (two) times daily. 60 tablet 3  . eplerenone (INSPRA) 25 MG tablet Take 1 tablet (25 mg total) by mouth 2 (two) times daily. 180 tablet 3  . levothyroxine (SYNTHROID, LEVOTHROID) 125 MCG tablet TAKE 125 MCG BY MOUTH DAILY  3  . midodrine (PROAMATINE) 10 MG tablet Take 1 tablet (10 mg total) by mouth 3 (three) times daily with meals. 270 tablet 3  . nitroGLYCERIN (NITROSTAT) 0.4 MG SL tablet Place 1 tablet (0.4 mg total) under the tongue every 5 (five) minutes as needed for chest pain. 25 tablet 2  . Potassium Chloride 40 MEQ/15ML (20%) SOLN Take 22.5 mLs by mouth 3 (three) times daily AND 15 mLs 3 (three) times a week. MON, WED, FRI. 4050 mL 3  . pravastatin (PRAVACHOL) 40 MG tablet Take 1 tablet (40 mg total) by mouth daily. 30 tablet 11  . torsemide (DEMADEX) 20 MG tablet Take 5 tabs in AM and 4 tabs in PM 270 tablet 11  . Vitamin D, Ergocalciferol, (DRISDOL) 50000 units CAPS capsule Take 50,000 Units by mouth every 7 (seven) days. On Mondays    . metolazone (ZAROXOLYN) 5 MG tablet Take 1 tablet (5 mg total) by mouth 3 (three) times a week. Every Monday, Wed and Friday 15 tablet 3   No current facility-administered medications for this visit.    Allergies  Allergen Reactions  . Spironolactone Hives     Review of Systems: All systems reviewed and negative except where noted in HPI.     Physical Exam:    Wt Readings from Last 3 Encounters:  07/11/18 168 lb (76.2 kg)  06/11/18 186 lb 6 oz (84.5 kg)  04/29/18 199 lb 3.2 oz (90.4 kg)    BP 98/72   Pulse 75   Ht '5\' 7"'  (1.702 m)   Wt 168 lb (76.2 kg)   SpO2 97%   BMI 26.31 kg/m  Constitutional:  Pleasant, in no acute distress. Psychiatric: Normal mood and affect. Behavior is normal. EENT: Pupils normal.  Conjunctivae are normal. No scleral icterus. Neck supple. No cervical  LAD. Cardiovascular: Normal rate, regular rhythm. No edema Pulmonary/chest: Effort normal and breath sounds normal. No wheezing, rales or rhonchi. Abdominal: Soft, nontender.  Distended with positive fluid wave consistent with ascites.  Bowel sounds active throughout. There are no masses palpable. No hepatomegaly. Neurological: Alert and oriented to person place and time. Skin: Multiple areas of ecchymosis on hands and arms, skin is warm and dry.    ASSESSMENT AND PLAN;   1) Ascites, cirrhosis: Cody Schwartz is a 62 y.o. male presenting with medically complicated ascites at least radiographic evidence of cirrhosis on recent ultrasound.  We discussed his ascites and likely cirrhosis at length.  At this time, unclear of etiology for cirrhosis and went through full DDX, to include secondary to congestive hepatopathy, and will plan on further evaluation as below:  - Check INR and CBC, particularly if considering biopsy  -  Repeat liver enzymes  - AMA, ANA, ASMA, A1AT, Hepatitis A, B, C evaluation, ferritin, iron panel, ceruloplasmin, LKM Ab, ACE level - Evaluate for Celiac with tTG  - Immunoglobulin level  - EGD at [facility] for variceal screening - Nephrology consult to assist in ascites requiring high doses of duiretics with som component of medication intolerance given rising creatinine over the year. Will likely benefit from UA, microalbumin, urine microscopy, but certain defer to Nephrology expertise in review of these recs - Has appt with Cardiology today - ?Amyoidosis. May need liver (or renal) bx - He had previous discussion with rads re: Pleurex cath for rapid reaccumulating ascites and escalating diuretics with reducing eGFR. Will discuss again on f/u - Consider consult to [facility] pending studies re: OLT eval. I briefly discussed this with the patient today and would certainly have more in depth conversation prior to referral placement regarding his goals of tx - Hold Eliquis x2 days  prior to EGD - [facility] screening in January 2020- plan for MRI liver - Request fluids at time of next paracentesis, to include serum albumin and peritoneal fluid sent for albumin, protein, cell count with differential, cytology, Gram stain, LDH; sent request to rads - Request records from Dr. Lyda Jester  2) CRC screening: Thinks last colonoscopy was approx 5 years ago and w/o polyps. Will request records. If actually due for CRC screening or polyp surveillance, will add to already scheduled EGD to be done at [facility]  3) CHF Reduced EF, necessitating procedure to be done at [facility]  4) Systemic anticoagulation: Hold Eliquis 2 days before procedure - will instruct when and how to resume after procedure. Low but real risk of cardiovascular event such as heart attack, stroke, embolism, thrombosis or ischemia/infarct of other organs off Eliquis explained and need to seek urgent help if this occurs. The patient consents to proceed. Will communicate by phone or EMR with patient's prescribing provider to confirm that holding Eliquis is reasonable in this case  5) CKD: - Referral to Nephrology  6) Hemangioma: RUS with likely hemangioma. Will obtain MRI Liver with next [facility] screen which will provide more definitive diagnostic characteristics  The indications, risks, and benefits of EGD were explained to the patient in detail. Risks include but are not limited to bleeding, perforation, adverse reaction to medications, and cardiopulmonary compromise. Sequelae include but are not limited to the possibility of surgery, hositalization, and mortality. The patient verbalized understanding and wished to proceed. All questions answered, referred to scheduler. Further recommendations pending results of the exam.    Lavena Bullion, DO, FACG  07/11/2018, 11:11 AM   [name]., MD

## 2018-07-11 NOTE — Progress Notes (Signed)
Advanced Heart Failure Clinic Note   Referring Physician: Dr Bettina Gavia PCP: [name]., MD PCP-Cardiologist: Dr Bettina Gavia HF: Dr Haroldine Laws   HPI: Cody Schwartz is a 62 y.o. male with chronic systolic failure due to ICM, CAD s/p PCI (most recent 09/2015), CKD, cirrhosis with ascites requiring frequent paracenteses, moderate pulmonary HTN referred by Dr. Bettina Gavia for further evaluation of HF and pulmonary HTN.[name]  He presents today for follow up. Last visit Torsemide and Eplerenone increased.  Taking torsemide 100/80, eplerenone 25 bid and metolazone on M/W/F. Remains on midodrine 10 tid. Says fluid level is much better controlled but still with edema and some ascites. Had paracentesis on Monday with 7L off.  SBP runs usually in 90s. Able to walk slowly for 69mins at a time. No CP or PND. Recently seen by GI who agrees that amyloid may be part of the issue. Pending possible liver biopsy.   Lives with his wife. No tobacco, ETOH, or drug use. Works full time as a Theme [employer] manager.  cMRI 02/13/18 LVEF 17%, Severe LAE and RAE, Moderate MR. Findings consistent with NICM (Non-infiltrative, non-inflammatory)  Echo 09/2015: (not available in echo system to review) Severely reduced overall LV systolic function. Estimated LV ejection fraction 25% There is global hypokinesis with akinesis of the anteroseptum, distal anterior wall, and apex. Indeterminate LV diastolic function. Mild global RV hypokinesis. Biatrial dilation. Mild mitral regurgitation Mild tricuspid regurgitation  Echo 11/2016: EF 20-25% (per Dr Oren Binet note) Echo 01/22/18: EF 15% with severe RV dysfunction  Moderate MR Personally reviewed  LHC 10/12/2015: 2 vessel CAD - LAD occluded at old stent site, de novo RCA lesion Successful PCI / Xience Drug Eluting Stent of the mid Right Coronary Artery. Successful PCI / PTCA of the distal Left Anterior Descending Coronary Artery.  [place] 10/12/2015: Hemodynamics (mmHg) RA mean 24 RV 59/17 PA  62/34 PCWP 33 AO 113 Cardiac Output (Thermo) 2.98 Cardiac Index (Thermo) 1.4  PYP 01/17/18: Visual and quantitative assessment (grade 1, H/CLL equal 1.45) are borderline values but concerning for transthyretin amyloidosis.  R/LHC 01/10/18: Ao = 82/59 (69) LV = 87/17 RA =  13 RV = 46/13 PA = 48/23 (33) PCW = 21 Fick cardiac output/index = 3.2/1.7 PVR = 3.9 WU Aosat = 96% PA sat = 51%, 51% Assessment: 1. 3v CAD with patent stents (LAD x 2), OM2, RCA 2. There is a 60-70% lesion int the mid LAD prior to previously placed stent. This is not flow-limiting. The apical LAD is occluded chronically 3. EF 25% by ECHO  4. Mild to moderately elevated filling pressures with low cardiac output Plan/Discussion: CAD is stable. RHC indicates a mixed picture but suspect that RV failure is likely the biggest limiting factor. PA pressures only mildly elevated. Unclear at this point if this represents burned out [place] or primary RV failure. Will get cardiac MRI to further assess.  Review of systems complete and found to be negative unless listed in HPI.    Past Medical History:  Diagnosis Date  . Congestive heart failure (CHF) ([place])   . Hyperlipidemia   . Hypokalemia     Current Outpatient Medications  Medication Sig Dispense Refill  . allopurinol (ZYLOPRIM) 100 MG tablet Take 100 mg by mouth daily.  12  . allopurinol (ZYLOPRIM) 300 MG tablet Take 300 mg by mouth daily.  12  . apixaban (ELIQUIS) 5 MG TABS tablet Take 1 tablet (5 mg total) by mouth 2 (two) times daily. 60 tablet 3  . eplerenone (INSPRA) 25 MG  tablet Take 1 tablet (25 mg total) by mouth 2 (two) times daily. 180 tablet 3  . levothyroxine (SYNTHROID, LEVOTHROID) 125 MCG tablet TAKE 125 MCG BY MOUTH DAILY  3  . metolazone (ZAROXOLYN) 5 MG tablet Take 1 tablet (5 mg total) by mouth 3 (three) times a week. Every Monday, Wed and Friday 15 tablet 3  . midodrine (PROAMATINE) 10 MG tablet Take 1 tablet (10 mg total) by mouth 3 (three) times  daily with meals. 270 tablet 3  . nitroGLYCERIN (NITROSTAT) 0.4 MG SL tablet Place 1 tablet (0.4 mg total) under the tongue every 5 (five) minutes as needed for chest pain. 25 tablet 2  . Potassium Chloride 40 MEQ/15ML (20%) SOLN Take 22.5 mLs by mouth 3 (three) times daily AND 15 mLs 3 (three) times a week. MON, WED, FRI. 4050 mL 3  . pravastatin (PRAVACHOL) 40 MG tablet Take 1 tablet (40 mg total) by mouth daily. 30 tablet 11  . torsemide (DEMADEX) 20 MG tablet Take 5 tabs in AM and 4 tabs in PM 270 tablet 11  . Vitamin D, Ergocalciferol, (DRISDOL) 50000 units CAPS capsule Take 50,000 Units by mouth every 7 (seven) days. On Mondays     No current facility-administered medications for this encounter.     Allergies  Allergen Reactions  . Spironolactone Hives      Social History   Socioeconomic History  . Marital status: Married    Spouse name: Not on file  . Number of children: Not on file  . Years of education: Not on file  . Highest education level: Not on file  Occupational History  . Not on file  Social Needs  . Financial resource strain: Not on file  . Food insecurity:    Worry: Not on file    Inability: Not on file  . Transportation needs:    Medical: Not on file    Non-medical: Not on file  Tobacco Use  . Smoking status: Never Smoker  . Smokeless tobacco: Never Used  Substance and Sexual Activity  . Alcohol use: No    Frequency: Never  . Drug use: Never  . Sexual activity: Not on file  Lifestyle  . Physical activity:    Days per week: Not on file    Minutes per session: Not on file  . Stress: Not on file  Relationships  . Social connections:    Talks on phone: Not on file    Gets together: Not on file    Attends religious service: Not on file    Active member of club or organization: Not on file    Attends meetings of clubs or organizations: Not on file    Relationship status: Not on file  . Intimate partner violence:    Fear of current or ex partner:  Not on file    Emotionally abused: Not on file    Physically abused: Not on file    Forced sexual activity: Not on file  Other Topics Concern  . Not on file  Social History Narrative  . Not on file      Family History  Problem Relation Age of Onset  . CAD Paternal Grandmother   . Stroke Paternal Grandfather   . CAD Paternal Grandfather   . Colon cancer Neg Hx    Vitals:   07/11/18 1551  BP: (!) 82/60  Pulse: 87  SpO2: 100%  Weight: 77.3 kg (170 lb 6.4 oz)     Wt Readings from Last 3  Encounters:  07/11/18 76.2 kg (168 lb)  06/11/18 84.5 kg (186 lb 6 oz)  04/29/18 90.4 kg (199 lb 3.2 oz)   PHYSICAL EXAM: General: NAD. Chronically ill appearing HEENT: Normal Neck: Supple. JVP 10 with prominent v waves. Carotids 2+ bilat; no bruits. No thyromegaly or nodule noted. Cor: PMI nondisplaced. Irregular rate and rhythm. 2/6 TR.  Lungs: CTAB, normal effort. Abdomen: Soft, non-tender,+ distended, no HSM. No bruits or masses. +BS  Extremities: No cyanosis, clubbing, or rash. 2-3+ BLE edema into thighs  Neuro: alert & oriented x 3, cranial nerves grossly intact. moves all 4 extremities w/o difficulty. Affect pleasant  ASSESSMENT & PLAN:  1. Chronic biventricular HF due to mixed NICM/ICM. R/LHC 09/2015:  - PCI mid RCA, PCI to distal LAD, PA 62/34, PCWP 33, Thermo CO 2.98, CI 2.98, Echo 11/2016: EF 20-25% (per note, results unavailable), Declined ICD in past.  - Now with progressive RV failure symptoms and cirrhosis. [place] 01/10/18: elevated filling pressures with low cardiac output. PVR 3.9. Sed rate, HIV, ANA, ANCA negative.  - Echo 01/22/18: EF 15-20%, severe biventricular failure.  - cMRI 02/13/18 LVEF 17%, RVEF 26% (Mod/Sev reduced), Severe LAE and RAE, Moderate MR. Findings consistent with NICM (Non-infiltrative, non-inflammatory) - NYHA III - Volume status remains elevated but improved.  - Continue torsemide 100/80 mg BID  - Continue metolazone 3x/week on MWF with extra 40 meq  potassium. BMET today.  - Continue eplerenone 25 BID. Continue to titrate as potassium tolerates. Would like to get him on at least 50 bid - Continue midodrine 10 tid  - Intolerant to BB in the past.  - PYP scan 4/19 grade 1, but quantitative 1.45. I have reviewed this study with Dr. Recardo Evangelist and we both think it is Grade 2-3. Will start tafamadis - Genetic testing negative for TTR.  2. CAD. LHC 4/19 with stable CAD - No s/s of ischemia.    - Continue statin carefully with cirrhosis - Not on ASA or plavix with eliquis  3. Pulm HTN with PA 62/34, wedge 33 on RHC 2017 - RHC with mildly elevated PA pressures (48/23), unclear is represents burned out PAH or primary RV failure.  - May be related to porto-pulmonary HTN  4. CKD IV - CMET today   5. Ascites - Has had multiple paracenteses this year with > 8L off.  - RUQ [place] 04/14/18 with cirrhotic liver and ascites.  - Radiology has suggested indwelling tchecnkoff for home management.  He is considering - Recent seen by GI. Cirrhosis w/u underway. Would favor liver biopsy to help confirm amyloid diagnosis  6. Afib - new as of 01/02/18  - Rate controlled.  - Continue eliquis 5 mg BID. Denies bleeding.   7. Hypokalemia - CMET today. - Continue eplerenone 25 mg BID  8. BLE leg wounds - Improved  Glori Bickers, MD  10:24 AM

## 2018-07-11 NOTE — Patient Instructions (Addendum)
If you are age 64 or older, your body mass index should be between 23-30. Your Body mass index is 26.31 kg/m. If this is out of the aforementioned range listed, please consider follow up with your Primary Care Provider.  If you are age 14 or younger, your body mass index should be between 19-25. Your Body mass index is 26.31 kg/m. If this is out of the aformentioned range listed, please consider follow up with your Primary Care Provider.   Please go to the lab on the 2nd floor [address] before you leave the office today.   You have been scheduled for an endoscopy. Please follow written instructions given to you at your visit today. If you use inhalers (even only as needed), please bring them with you on the day of your procedure. Your physician has requested that you go to www.startemmi.com and enter the access code given to you at your visit today. This web site gives a general overview about your procedure. However, you should still follow specific instructions given to you by our office regarding your preparation for the procedure.  You will be contacted by our office prior to your procedure for directions on holding your Eliquis.  If you do not hear from our office 1 week prior to your scheduled procedure, please call [phone] to discuss.   Please call us to schedule your 3 month follow up. You need to be seen again in January 2020.  It was a pleasure to see you today!  Vito Cirigliano, D.O.

## 2018-07-11 NOTE — Addendum Note (Signed)
Addended by: Scarlette Calico on: 07/11/2018 03:38 PM   Modules accepted: Orders

## 2018-07-11 NOTE — Telephone Encounter (Signed)
[facility] Pre-operative Risk Assessment     Request for surgical clearance:     Endoscopy Procedure  What type of surgery is being performed?     Endoscopy   When is this surgery scheduled?     08/05/2018 12:30pm  What type of clearance is required ?   Pharmacy  Are there any medications that need to be held prior to surgery and how long? Eliquis 2days  Practice name and name of physician performing surgery?      [facility]   Dr. Bryan Lemma  What is your office phone and fax number?      Phone- [phone]  [phone]  Anesthesia type (None, local, MAC, general) ?       MAC

## 2018-07-11 NOTE — Patient Instructions (Signed)
We have recommended you take VyndaMax 61 mg daily, this has to come from a specialty pharmacy, once it is approved with your insurance they will contact you before shipping.  If your copay is to expensive let them know and they can arrange patient assistance for you.  Your physician recommends that you schedule a follow-up appointment in: 6 weeks

## 2018-07-12 LAB — HEPATITIS C ANTIBODY

## 2018-07-12 LAB — HEPATITIS A ANTIBODY, TOTAL: Hep A Total Ab: NEGATIVE

## 2018-07-12 LAB — MITOCHONDRIAL ANTIBODIES

## 2018-07-12 LAB — IGG, IGA, IGM
IgA: 261 mg/dL (ref 61–437)
IgG (Immunoglobin G), Serum: 794 mg/dL (ref 700–1600)
IgM (Immunoglobulin M), Srm: 92 mg/dL (ref 20–172)

## 2018-07-12 LAB — ALPHA-1-ANTITRYPSIN: A1 ANTITRYPSIN SER: 288 mg/dL — AB (ref 90–200)

## 2018-07-12 LAB — ANGIOTENSIN CONVERTING ENZYME: ANGIOTENSIN-CONVERTING ENZYME: 67 U/L (ref 14–82)

## 2018-07-12 LAB — CERULOPLASMIN: Ceruloplasmin: 23.2 mg/dL (ref 16.0–31.0)

## 2018-07-12 LAB — ANTI-SMOOTH MUSCLE ANTIBODY, IGG: F-ACTIN AB IGG: 7 U (ref 0–19)

## 2018-07-12 LAB — HEPATITIS B SURFACE ANTIBODY,QUALITATIVE: Hep B S Ab: NONREACTIVE

## 2018-07-12 LAB — ANA: Anti Nuclear Antibody(ANA): NEGATIVE

## 2018-07-13 LAB — ANTI-MICROSOMAL ANTIBODY LIVER / KIDNEY: LKM1 Ab: 0.9 Units (ref 0.0–20.0)

## 2018-07-14 ENCOUNTER — Telehealth: Payer: Self-pay

## 2018-07-14 ENCOUNTER — Other Ambulatory Visit: Payer: Self-pay

## 2018-07-14 DIAGNOSIS — R945 Abnormal results of liver function studies: Secondary | ICD-10-CM

## 2018-07-14 DIAGNOSIS — K7460 Unspecified cirrhosis of liver: Secondary | ICD-10-CM

## 2018-07-14 DIAGNOSIS — R188 Other ascites: Principal | ICD-10-CM

## 2018-07-14 DIAGNOSIS — R7989 Other specified abnormal findings of blood chemistry: Secondary | ICD-10-CM

## 2018-07-14 NOTE — Telephone Encounter (Signed)
Dr. Bryan Lemma reviewed Colonoscopy and Pathology Reports from 07/2014 when patient had procedure done at [facility] by Dr. Jari Sportsman. The report showed a single 4 mm tubular adenoma and pandiverticular disease. He is set up as a recall for repeat colonoscopy in 07/2019.

## 2018-07-15 ENCOUNTER — Telehealth: Payer: Self-pay

## 2018-07-15 NOTE — Telephone Encounter (Signed)
Faxed Referral with Office Note and Labs to Dr. Jimmy Footman at [facility] for a consult.

## 2018-07-15 NOTE — Telephone Encounter (Signed)
Pt takes Eliquis for afib with CHADS2VASc score of 3 (CHF, CAD, DM). SCr 2.03, CrCl 53mL/min. Pt on Eliquis 5mg  BID due to age < 56 and weight > 60kg. With renal dysfunction, recommend holding Eliquis for 3 days prior to procedure since he is relatively low risk from a cardiac perspective.

## 2018-07-20 ENCOUNTER — Other Ambulatory Visit: Payer: Self-pay | Admitting: Radiology

## 2018-07-22 ENCOUNTER — Ambulatory Visit (HOSPITAL_COMMUNITY)
Admission: RE | Admit: 2018-07-22 | Discharge: 2018-07-22 | Disposition: A | Discharge status: Home / Self Care | Payer: BLUE CROSS/BLUE SHIELD | Source: Ambulatory Visit | Attending: Gastroenterology | Admitting: Gastroenterology

## 2018-07-22 ENCOUNTER — Encounter (HOSPITAL_COMMUNITY): Payer: Self-pay

## 2018-07-22 ENCOUNTER — Other Ambulatory Visit: Payer: Self-pay

## 2018-07-22 DIAGNOSIS — I5022 Chronic systolic (congestive) heart failure: Secondary | ICD-10-CM | POA: Diagnosis not present

## 2018-07-22 DIAGNOSIS — E785 Hyperlipidemia, unspecified: Secondary | ICD-10-CM | POA: Insufficient documentation

## 2018-07-22 DIAGNOSIS — Z79899 Other long term (current) drug therapy: Secondary | ICD-10-CM | POA: Insufficient documentation

## 2018-07-22 DIAGNOSIS — I4891 Unspecified atrial fibrillation: Secondary | ICD-10-CM | POA: Diagnosis not present

## 2018-07-22 DIAGNOSIS — N189 Chronic kidney disease, unspecified: Secondary | ICD-10-CM | POA: Diagnosis not present

## 2018-07-22 DIAGNOSIS — Z79890 Hormone replacement therapy (postmenopausal): Secondary | ICD-10-CM | POA: Diagnosis not present

## 2018-07-22 DIAGNOSIS — Z823 Family history of stroke: Secondary | ICD-10-CM | POA: Diagnosis not present

## 2018-07-22 DIAGNOSIS — R945 Abnormal results of liver function studies: Secondary | ICD-10-CM | POA: Insufficient documentation

## 2018-07-22 DIAGNOSIS — K7460 Unspecified cirrhosis of liver: Secondary | ICD-10-CM | POA: Diagnosis not present

## 2018-07-22 DIAGNOSIS — R188 Other ascites: Secondary | ICD-10-CM | POA: Insufficient documentation

## 2018-07-22 DIAGNOSIS — K740 Hepatic fibrosis: Secondary | ICD-10-CM | POA: Diagnosis not present

## 2018-07-22 DIAGNOSIS — E876 Hypokalemia: Secondary | ICD-10-CM | POA: Diagnosis not present

## 2018-07-22 DIAGNOSIS — Z7901 Long term (current) use of anticoagulants: Secondary | ICD-10-CM | POA: Insufficient documentation

## 2018-07-22 DIAGNOSIS — I2510 Atherosclerotic heart disease of native coronary artery without angina pectoris: Secondary | ICD-10-CM | POA: Insufficient documentation

## 2018-07-22 DIAGNOSIS — I272 Pulmonary hypertension, unspecified: Secondary | ICD-10-CM | POA: Diagnosis not present

## 2018-07-22 DIAGNOSIS — Z9889 Other specified postprocedural states: Secondary | ICD-10-CM | POA: Insufficient documentation

## 2018-07-22 DIAGNOSIS — Z8249 Family history of ischemic heart disease and other diseases of the circulatory system: Secondary | ICD-10-CM | POA: Diagnosis not present

## 2018-07-22 DIAGNOSIS — R7989 Other specified abnormal findings of blood chemistry: Secondary | ICD-10-CM

## 2018-07-22 HISTORY — PX: OTHER SURGICAL HISTORY: SHX169

## 2018-07-22 LAB — CBC WITH DIFFERENTIAL/PLATELET
Abs Immature Granulocytes: 0.2 10*3/uL — ABNORMAL HIGH (ref 0.00–0.07)
Basophils Absolute: 0.1 10*3/uL (ref 0.0–0.1)
Basophils Relative: 0 %
EOS ABS: 0.1 10*3/uL (ref 0.0–0.5)
Eosinophils Relative: 1 %
HEMATOCRIT: 34.4 % — AB (ref 39.0–52.0)
Hemoglobin: 11.1 g/dL — ABNORMAL LOW (ref 13.0–17.0)
IMMATURE GRANULOCYTES: 1 %
Lymphocytes Relative: 5 %
Lymphs Abs: 0.7 10*3/uL (ref 0.7–4.0)
MCH: 31.2 pg (ref 26.0–34.0)
MCHC: 32.3 g/dL (ref 30.0–36.0)
MCV: 96.6 fL (ref 80.0–100.0)
MONO ABS: 0.4 10*3/uL (ref 0.1–1.0)
MONOS PCT: 3 %
NEUTROS ABS: 13.7 10*3/uL — AB (ref 1.7–7.7)
NEUTROS PCT: 90 %
PLATELETS: 188 10*3/uL (ref 150–400)
RBC: 3.56 MIL/uL — ABNORMAL LOW (ref 4.22–5.81)
RDW: 15.4 % (ref 11.5–15.5)
WBC: 15.2 10*3/uL — AB (ref 4.0–10.5)
nRBC: 0 % (ref 0.0–0.2)

## 2018-07-22 LAB — COMPREHENSIVE METABOLIC PANEL
ALBUMIN: 3 g/dL — AB (ref 3.5–5.0)
ALK PHOS: 142 U/L — AB (ref 38–126)
ALT: 13 U/L (ref 0–44)
ANION GAP: 13 (ref 5–15)
AST: 19 U/L (ref 15–41)
BUN: 69 mg/dL — ABNORMAL HIGH (ref 8–23)
CALCIUM: 8.5 mg/dL — AB (ref 8.9–10.3)
CHLORIDE: 98 mmol/L (ref 98–111)
CO2: 17 mmol/L — ABNORMAL LOW (ref 22–32)
Creatinine, Ser: 1.85 mg/dL — ABNORMAL HIGH (ref 0.61–1.24)
GFR calc non Af Amer: 37 mL/min — ABNORMAL LOW (ref >60)
GFR, EST AFRICAN AMERICAN: 43 mL/min — AB (ref >60)
GLUCOSE: 97 mg/dL (ref 70–99)
Potassium: 3.2 mmol/L — ABNORMAL LOW (ref 3.5–5.1)
Sodium: 128 mmol/L — ABNORMAL LOW (ref 135–145)
Total Bilirubin: 1.3 mg/dL — ABNORMAL HIGH (ref 0.3–1.2)
Total Protein: 6.2 g/dL — ABNORMAL LOW (ref 6.5–8.1)

## 2018-07-22 LAB — PROTIME-INR
INR: 1.21
Prothrombin Time: 15.2 seconds (ref 11.4–15.2)

## 2018-07-22 MED ORDER — SODIUM CHLORIDE 0.9 % IV SOLN
INTRAVENOUS | Status: DC
  Administered 2018-07-22: 11:00:00 via INTRAVENOUS

## 2018-07-22 MED ORDER — GELATIN ABSORBABLE 12-7 MM EX MISC
CUTANEOUS | Status: AC
  Filled 2018-07-22: qty 1

## 2018-07-22 MED ORDER — MIDAZOLAM HCL 2 MG/2ML IJ SOLN
INTRAMUSCULAR | Status: AC
  Filled 2018-07-22: qty 4

## 2018-07-22 MED ORDER — LIDOCAINE HCL (PF) 1 % IJ SOLN
INTRAMUSCULAR | Status: AC | PRN
  Administered 2018-07-22: 10 mL

## 2018-07-22 MED ORDER — FENTANYL CITRATE (PF) 100 MCG/2ML IJ SOLN
INTRAMUSCULAR | Status: AC
  Filled 2018-07-22: qty 2

## 2018-07-22 MED ORDER — MIDAZOLAM HCL 2 MG/2ML IJ SOLN
INTRAMUSCULAR | Status: AC | PRN
  Administered 2018-07-22: 1 mg via INTRAVENOUS

## 2018-07-22 MED ORDER — LIDOCAINE HCL 1 % IJ SOLN
INTRAMUSCULAR | Status: AC
  Filled 2018-07-22: qty 20

## 2018-07-22 MED ORDER — FENTANYL CITRATE (PF) 100 MCG/2ML IJ SOLN
INTRAMUSCULAR | Status: AC | PRN
  Administered 2018-07-22: 50 ug via INTRAVENOUS

## 2018-07-22 NOTE — Consult Note (Signed)
Chief Complaint: Patient was seen in consultation today for image guided random core liver biopsy  Referring Physician(s): Harris V  Supervising Physician: Aletta Edouard  Patient Status: [facility] - Out-pt  History of Present Illness: Cody Schwartz is a 62 y.o. male with history of atrial  fibrillation on Eliquis, chronic systolic heart failure due to ICM, coronary artery disease with prior PCI, chronic kidney disease, elevated liver function tests, cirrhosis with ascites requiring frequent paracenteses [facility]) and moderate pulmonary hypertension.  Request received for image guided random core liver biopsy (with amyloid staining) for further evaluation.  Past Medical History:  Diagnosis Date  . Congestive heart failure (CHF) ([place])   . Hyperlipidemia   . Hypokalemia     Past Surgical History:  Procedure Laterality Date  . RIGHT/LEFT HEART CATH AND CORONARY ANGIOGRAPHY N/A 01/10/2018   Procedure: RIGHT/LEFT HEART CATH AND CORONARY ANGIOGRAPHY;  Surgeon: Jolaine Artist, MD;  Location: [place] [facility];  Service: Cardiovascular;  Laterality: N/A;    Allergies: Spironolactone  Medications: Prior to Admission medications   Medication Sig Start Date End Date Taking? Authorizing Provider  allopurinol (ZYLOPRIM) 100 MG tablet Take 100 mg by mouth daily. 02/26/17  Yes [provider]  allopurinol (ZYLOPRIM) 300 MG tablet Take 300 mg by mouth daily. 03/09/17  Yes [provider]  eplerenone (INSPRA) 25 MG tablet Take 1 tablet (25 mg total) by mouth 2 (two) times daily. 06/11/18  Yes Bensimhon, Shaune Pascal, MD  levothyroxine (SYNTHROID, LEVOTHROID) 125 MCG tablet TAKE 125 MCG BY MOUTH DAILY 11/05/17  Yes [provider]  midodrine (PROAMATINE) 10 MG tablet Take 1 tablet (10 mg total) by mouth 3 (three) times daily with meals. 06/11/18  Yes Bensimhon, Shaune Pascal, MD  Potassium Chloride 40 MEQ/15ML (20%) SOLN Take 22.5 mLs by mouth 3 (three)  times daily AND 15 mLs 3 (three) times a week. MON, WED, FRI. 04/24/18  Yes Larey Dresser, MD  pravastatin (PRAVACHOL) 40 MG tablet Take 1 tablet (40 mg total) by mouth daily. 03/29/17  Yes [name], MD  torsemide (DEMADEX) 20 MG tablet Take 5 tabs in AM and 4 tabs in PM 06/11/18  Yes Bensimhon, Shaune Pascal, MD  apixaban (ELIQUIS) 5 MG TABS tablet Take 1 tablet (5 mg total) by mouth 2 (two) times daily. 01/02/18   Bensimhon, Shaune Pascal, MD  metolazone (ZAROXOLYN) 5 MG tablet Take 1 tablet (5 mg total) by mouth 3 (three) times a week. Every Monday, Wed and Friday 03/19/18 06/17/18  Bensimhon, Shaune Pascal, MD  nitroGLYCERIN (NITROSTAT) 0.4 MG SL tablet Place 1 tablet (0.4 mg total) under the tongue every 5 (five) minutes as needed for chest pain. 03/29/17   [name], MD  Vitamin D, Ergocalciferol, (DRISDOL) 50000 units CAPS capsule Take 50,000 Units by mouth every 7 (seven) days. On Mondays    [provider]     Family History  Problem Relation Age of Onset  . CAD Paternal Grandmother   . Stroke Paternal Grandfather   . CAD Paternal Grandfather   . Colon cancer Neg Hx     Social History   Socioeconomic History  . Marital status: Married    Spouse name: Not on file  . Number of children: Not on file  . Years of education: Not on file  . Highest education level: Not on file  Occupational History  . Not on file  Social Needs  . Financial resource strain: Not on file  . Food  insecurity:    Worry: Not on file    Inability: Not on file  . Transportation needs:    Medical: Not on file    Non-medical: Not on file  Tobacco Use  . Smoking status: Never Smoker  . Smokeless tobacco: Never Used  Substance and Sexual Activity  . Alcohol use: No    Frequency: Never  . Drug use: Never  . Sexual activity: Not on file  Lifestyle  . Physical activity:    Days per week: Not on file    Minutes per session: Not on file  . Stress: Not on file  Relationships  . Social connections:     Talks on phone: Not on file    Gets together: Not on file    Attends religious service: Not on file    Active member of club or organization: Not on file    Attends meetings of clubs or organizations: Not on file    Relationship status: Not on file  Other Topics Concern  . Not on file  Social History Narrative  . Not on file      Review of Systems currently denies fever, headache, worsening chest pain, dyspnea, cough, abdominal/back pain, nausea, vomiting or bleeding.  He does have bilateral lower extremity discomfort.  Vital Signs: BP 91/68   Pulse 84   Temp 97.6 F (36.4 C) (Oral)   Resp 18   Ht 5\' 7"  (1.702 m)   Wt 171 lb 12.6 oz (77.9 kg)   SpO2 100%   BMI 26.91 kg/m   Physical Exam awake, alert.  Chest clear to auscultation bilaterally.  Heart with  irreg irreg rhythm; 2/6 TR; abdomen soft, distended, nontender, positive bowel sounds; bilat lower extremity edema noted  Imaging: No results found.  Labs:  CBC: Recent Labs    01/02/18 1315 07/11/18 1606 07/22/18 1105  WBC 8.6 10.0 15.2*  HGB 13.0 11.4* 11.1*  HCT 38.7* 33.1* 34.4*  PLT 220 221 188    COAGS: Recent Labs    01/02/18 1315 07/11/18 1606 07/22/18 1105  INR 1.17 1.44 1.21    BMP: Recent Labs    06/11/18 1223 06/23/18 1115 07/11/18 1605 07/22/18 1105  NA 129* 126* 125* 128*  K 3.9 4.4 3.1* 3.2*  CL 96* 93* 98 98  CO2 21* 18* 17* 17*  GLUCOSE 146* 88 156* 97  BUN 77* 73* 66* 69*  CALCIUM 8.8* 8.6* 8.4* 8.5*  CREATININE 2.13* 2.48* 2.03* 1.85*  GFRNONAA 31* 26* 33* 37*  GFRAA 36* 30* 38* 43*    LIVER FUNCTION TESTS: Recent Labs    06/11/18 1223 06/23/18 1115 07/11/18 1605 07/22/18 1105  BILITOT 1.2 1.5* 1.0 1.3*  AST 38 37 22 19  ALT 21 19 13 13   ALKPHOS 142* 153* 131* 142*  PROT 6.5 6.0* 5.7* 6.2*  ALBUMIN 3.1* 3.0* 3.0* 3.0*    TUMOR MARKERS: No results for input(s): AFPTM, CEA, CA199, CHROMGRNA in the last 8760 hours.  Assessment and Plan: 62 y.o. male with  history of atrial  fibrillation on Eliquis, chronic systolic heart failure due to ICM, coronary artery disease with prior PCI, chronic kidney disease, elevated liver function tests, cirrhosis with ascites requiring frequent paracenteses [facility]) and moderate pulmonary hypertension.  Request received for image guided random core liver biopsy (with amyloid staining) for further evaluation.Risks and benefits discussed with the patient/spouse including, but not limited to bleeding, infection, damage to adjacent structures or low yield requiring additional tests, need for possible transjugular approach.  All of the patient's questions were answered, patient is agreeable to proceed. Consent signed and in chart.     Thank you for this interesting consult.  I greatly enjoyed meeting Cody Schwartz and look forward to participating in their care.  A copy of this report was sent to the requesting provider on this date.  Electronically Signed: D. Rowe Robert, PA-C 07/22/2018, 12:49 PM   I spent a total of 25 minutes  in face to face in clinical consultation, greater than 50% of which was counseling/coordinating care for image guided random core liver biopsy

## 2018-07-22 NOTE — Procedures (Signed)
Interventional Radiology Procedure Note  Procedure: US guided liver biopsy  Complications: None  Estimated Blood Loss: < 10 mL  Findings: 18 G core biopsy of liver in right lobe via 17 G needle. Gelfoam slurry injected on completion via outer needle.  Venetia Night. Kathlene Cote, M.D Pager:  [phone]

## 2018-07-22 NOTE — Discharge Instructions (Signed)
Per Dr. Kathlene Cote, restart taking Eliquis tomorrow as prescribed  Moderate Conscious Sedation, Adult, Care After These instructions provide you with information about caring for yourself after your procedure. Your health care provider may also give you more specific instructions. Your treatment has been planned according to current medical practices, but problems sometimes occur. Call your health care provider if you have any problems or questions after your procedure. What can I expect after the procedure? After your procedure, it is common:  To feel sleepy for several hours.  To feel clumsy and have poor balance for several hours.  To have poor judgment for several hours.  To vomit if you eat too soon.  Follow these instructions at home: For at least 24 hours after the procedure:   Do not: ? Participate in activities where you could fall or become injured. ? Drive. ? Use heavy machinery. ? Drink alcohol. ? Take sleeping pills or medicines that cause drowsiness. ? Make important decisions or sign legal documents. ? Take care of children on your own.  Rest. Eating and drinking  Follow the diet recommended by your health care provider.  If you vomit: ? Drink water, juice, or soup when you can drink without vomiting. ? Make sure you have little or no nausea before eating solid foods. General instructions  Have a responsible adult stay with you until you are awake and alert.  Take over-the-counter and prescription medicines only as told by your health care provider.  If you smoke, do not smoke without supervision.  Keep all follow-up visits as told by your health care provider. This is important. Contact a health care provider if:  You keep feeling nauseous or you keep vomiting.  You feel light-headed.  You develop a rash.  You have a fever. Get help right away if:  You have trouble breathing. This information is not intended to replace advice given to you by your  health care provider. Make sure you discuss any questions you have with your health care provider. Document Released: 07/01/2013 Document Revised: 02/13/2016 Document Reviewed: 12/31/2015 Elsevier Interactive Patient Education  2018 [employer].   Liver Biopsy, Care After These instructions give you information on caring for yourself after your procedure. Your doctor may also give you more specific instructions. Call your doctor if you have any problems or questions after your procedure. Follow these instructions at home:  Rest at home for 1-2 days or as told by your doctor.  Have someone stay with you for at least 24 hours.  Do not do these things in the first 24 hours: ? Drive. ? Use machinery. ? Take care of other people. ? Sign legal documents. ? Take a bath or shower.  There are many different ways to close and cover a cut (incision). For example, a cut can be closed with stitches, skin glue, or adhesive strips. Follow your doctor's instructions on: ? Taking care of your cut. ? Changing and removing your bandage (dressing).  You may remove your dressing tomorrow.  You may shower tomorrow. ? Removing whatever was used to close your cut.  Do not drink alcohol in the first week.  Do not lift more than 5 pounds or play contact sports for the first 2 weeks.  Take medicines only as told by your doctor. For 1 week, do not take medicine that has aspirin in it or medicines like ibuprofen.  Get your test results. Contact a doctor if:  A cut bleeds and leaves more than just a  small spot of blood.  A cut is red, puffs up (swells), or hurts more than before.  Fluid or something else comes from a cut.  A cut smells bad.  You have a fever or chills. Get help right away if:  You have swelling, bloating, or pain in your belly (abdomen).  You get dizzy or faint.  You have a rash.  You feel sick to your stomach (nauseous) or throw up (vomit).  You have trouble breathing, feel  short of breath, or feel faint.  Your chest hurts.  You have problems talking or seeing.  You have trouble balancing or moving your arms or legs. This information is not intended to replace advice given to you by your health care provider. Make sure you discuss any questions you have with your health care provider. Document Released: 06/19/2008 Document Revised: 02/16/2016 Document Reviewed: 11/06/2013 Elsevier Interactive Patient Education  [employer].

## 2018-07-29 ENCOUNTER — Telehealth: Payer: Self-pay | Admitting: Gastroenterology

## 2018-07-29 DIAGNOSIS — N189 Chronic kidney disease, unspecified: Secondary | ICD-10-CM | POA: Diagnosis not present

## 2018-07-29 DIAGNOSIS — I129 Hypertensive chronic kidney disease with stage 1 through stage 4 chronic kidney disease, or unspecified chronic kidney disease: Secondary | ICD-10-CM | POA: Diagnosis not present

## 2018-07-29 DIAGNOSIS — N2581 Secondary hyperparathyroidism of renal origin: Secondary | ICD-10-CM | POA: Diagnosis not present

## 2018-07-29 DIAGNOSIS — N184 Chronic kidney disease, stage 4 (severe): Secondary | ICD-10-CM | POA: Diagnosis not present

## 2018-07-29 DIAGNOSIS — D631 Anemia in chronic kidney disease: Secondary | ICD-10-CM | POA: Diagnosis not present

## 2018-07-29 NOTE — Telephone Encounter (Signed)
I have also been trying to reach him by phone to go over his liver biopsy results.  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  There is no evidence of Amyloidosis.  Still plan on EGD next week for variceal screening and follow-up with his cardiologist.  I also recommend close follow-up with me in the next 2 to 3 weeks to discuss this in further detail and ongoing management, to include ongoing treatment of recurrent ascites, review of Nephrologist evaluation, and possibly sending for further evaluation with a Hepatologist.  I also sent a message to his Cardiologist regarding the results of the liver biopsy.  Thank you!

## 2018-07-29 NOTE — Telephone Encounter (Signed)
Patient calling for biopsy results from liver bx.  Please review and advise

## 2018-07-29 NOTE — Telephone Encounter (Signed)
I spoke with the patient's wife. She is notified of the results and recommendations.  They are at the nephrologists now.  We scheduled a follow up for 08/13/18 in the office to discuss management.

## 2018-07-30 ENCOUNTER — Encounter: Payer: Self-pay | Admitting: Nurse Practitioner

## 2018-07-30 ENCOUNTER — Telehealth: Payer: Self-pay | Admitting: Gastroenterology

## 2018-07-30 ENCOUNTER — Ambulatory Visit: Payer: BLUE CROSS/BLUE SHIELD | Admitting: Nurse Practitioner

## 2018-07-30 VITALS — BP 106/82 | HR 68 | Ht 68.0 in | Wt 180.1 lb

## 2018-07-30 DIAGNOSIS — L0390 Cellulitis, unspecified: Secondary | ICD-10-CM

## 2018-07-30 DIAGNOSIS — K7460 Unspecified cirrhosis of liver: Secondary | ICD-10-CM | POA: Diagnosis not present

## 2018-07-30 DIAGNOSIS — R188 Other ascites: Secondary | ICD-10-CM | POA: Diagnosis not present

## 2018-07-30 MED ORDER — CEPHALEXIN 250 MG PO CAPS: 250.0000 mg | ORAL_CAPSULE | Freq: Four times a day (QID) | ORAL | 0 refills | Status: DC

## 2018-07-30 NOTE — Patient Instructions (Addendum)
If you are age 64 or older, your body mass index should be between 23-30. Your Body mass index is 27.39 kg/m. If this is out of the aforementioned range listed, please consider follow up with your Primary Care Provider.  If you are age 43 or younger, your body mass index should be between 19-25. Your Body mass index is 27.39 kg/m. If this is out of the aformentioned range listed, please consider follow up with your Primary Care Provider.   We have sent the following medications to your pharmacy for you to pick up at your convenience: Keflex 250 mg  Call Dr. Vivia Ewing office after antibiotic with an update.  Go to Emergency Department if fever or getting worse.  Thank you for choosing me and [facility].   Tye Savoy, NP

## 2018-07-30 NOTE — Progress Notes (Signed)
Agree with Ms. Guenther's assessment and plan. Will plan on re-evaluating for improvement/response to therapy at time of EGD as scheduled early next week with me. Otherwise, will continue to address and manage his underlying cirrhosis on f/u appt with me. Sincerely appreciate Ms. Guenther seeing him today for expedited evaluation and treatment.   Rawn Quiroa, DO, [facility]

## 2018-07-30 NOTE — Telephone Encounter (Signed)
Patient states he noticed a lump on his left side and is not sure if it is fluid or an infection, but since he just had an US of the right abd, patient is worried and requesting to speak with a nurse.

## 2018-07-30 NOTE — H&P (View-Only) (Signed)
Agree with Ms. Guenther's assessment and plan. Will plan on re-evaluating for improvement/response to therapy at time of EGD as scheduled early next week with me. Otherwise, will continue to address and manage his underlying cirrhosis on f/u appt with me. Sincerely appreciate Ms. Guenther seeing him today for expedited evaluation and treatment.   Jameson Tormey, DO, [facility]

## 2018-07-30 NOTE — Progress Notes (Signed)
Chief Complaint:    Sore on abdomen  IMPRESSION and PLAN:    21.  62 year old male with ischemic cardiomyopathy / CHF who recently established care with Dr. Bryan Lemma for cirrhosis/ascites.  Cardiac cirrhosis? as recent biopsy suggests congestive hepatopathy with transition into cirrhosis. Requiring frequent paracentesis, next one scheduled for next week -Ongoing cirrhosis management per Dr. Bryan Lemma.  -He is scheduled for EGD for varices screening on Tuesday.   2. ? Abdominal wall cellulitis. Patient worked in for evaluation of  "a sore" on left lower abdomen.  -In left pelvic area there is a tender, nodule the size of a quarter with ecchymotic area on top. There is surrounding mild erythema and tenderness raising concern for early cellulitis (see imported photo).  To put things in perspective,  the imported photo may look like the nodule is periumbilical but it is much more distal making Sister Wynona Dove nodule unlikely. He is up to date on [facility] surveillance  -Keflex 250 mg QID x 5 days.  Patient creatinine clearance he should not need lesion can be reevaluated at time of upper endoscopy in 6 days.  He was advised to call us back ASAP or go to ED if nodule enlarging, erythema spreading, or develops fever.   HPI:     Patient is a 44 y Male with CKD and a significant cardiac hx including CAD s/p DES Jan 2017 / ischemic cardiomyopathy / CHF (EF 15%), severe RV dysfunction.  In mid October he established care with Dr. Bryan Lemma for evaluuation of cirrhosis / ascites. He was previously followed by GI in [facility]  Our [place] office requested records. His diuretics have been managed by Cardiology.  He has been undergoing frequent paracentesis by IR.  We obtained extensive labs looking for etiology of cirrhosis, studies were all unrevealing.   Patient underwent liver bx of 10/29. He is scheduled for EGD for varices screening.  Patient comes in today for evaluation  of a new sore on the left side of his abdomen.  Patient describes it as a boil.  It is very tender    Review of systems:     No chest pain, no SOB, no fevers, no urinary sx   Past Medical History:  Diagnosis Date  . Congestive heart failure (CHF) ([place])   . Hyperlipidemia   . Hypokalemia     Patient's surgical history, family medical history, social history, medications and allergies were all reviewed in Epic   Serum creatinine: 1.85 mg/dL (H) 07/22/18 1105 Estimated creatinine clearance: 39 mL/min (A)  Current Outpatient Medications  Medication Sig Dispense Refill  . allopurinol (ZYLOPRIM) 100 MG tablet Take 100 mg by mouth daily.  12  . allopurinol (ZYLOPRIM) 300 MG tablet Take 300 mg by mouth daily.  12  . apixaban (ELIQUIS) 5 MG TABS tablet Take 1 tablet (5 mg total) by mouth 2 (two) times daily. 60 tablet 3  . eplerenone (INSPRA) 25 MG tablet Take 1 tablet (25 mg total) by mouth 2 (two) times daily. 180 tablet 3  . levothyroxine (SYNTHROID, LEVOTHROID) 125 MCG tablet TAKE 125 MCG BY MOUTH DAILY  3  . midodrine (PROAMATINE) 10 MG tablet Take 1 tablet (10 mg total) by mouth 3 (three) times daily with meals. 270 tablet 3  . nitroGLYCERIN (NITROSTAT) 0.4 MG SL tablet Place 1 tablet (0.4 mg total) under the tongue every 5 (five) minutes as needed for chest pain. 25 tablet 2  .  Potassium Chloride 40 MEQ/15ML (20%) SOLN Take 22.5 mLs by mouth 3 (three) times daily AND 15 mLs 3 (three) times a week. MON, WED, FRI. 4050 mL 3  . pravastatin (PRAVACHOL) 40 MG tablet Take 1 tablet (40 mg total) by mouth daily. 30 tablet 11  . torsemide (DEMADEX) 20 MG tablet Take 5 tabs in AM and 4 tabs in PM 270 tablet 11  . Vitamin D, Ergocalciferol, (DRISDOL) 50000 units CAPS capsule Take 50,000 Units by mouth every 7 (seven) days. On Mondays    . metolazone (ZAROXOLYN) 5 MG tablet Take 1 tablet (5 mg total) by mouth 3 (three) times a week. Every Monday, Wed and Friday 15 tablet 3   No current  facility-administered medications for this visit.     Physical Exam:     BP 106/82   Pulse 68   Ht 5\' 8"  (1.727 m)   Wt 180 lb 2 oz (81.7 kg)   BMI 27.39 kg/m       GENERAL:  Pleasant thin male in NAD PSYCH: : Cooperative, normal affect PULM: Normal respiratory effort, lungs CTA bilaterally, no wheezing ABDOMEN:  Distended but soft, nontender except over affected area. In distal left pelvic area there is a tender, nodule with dark area on top. There is mild surrounding erythema and tenderness. Normal bowel sounds.  Musculoskeletal:  Lower extremity weakness NEURO: Alert and oriented x 3, no focal neurologic deficits   Tye Savoy , NP 07/30/2018, 2:45 PM

## 2018-07-30 NOTE — Telephone Encounter (Signed)
Patient is s/p liver bx on 07/22/18.  Started having a swelling "about my belt line" about 2 days ago.  He reports is about 1 inch and a half wide by approximately inch and a half tall.  He states it is tender to the touch.  He is advised unlikely to be related to the liver bx, but he is very concerned.  He will come in today and see Tye Savoy RNP at 2:00

## 2018-08-01 ENCOUNTER — Encounter (HOSPITAL_COMMUNITY): Payer: Self-pay

## 2018-08-04 ENCOUNTER — Other Ambulatory Visit: Payer: Self-pay

## 2018-08-04 ENCOUNTER — Encounter (HOSPITAL_COMMUNITY): Payer: Self-pay | Admitting: *Deleted

## 2018-08-05 ENCOUNTER — Ambulatory Visit (HOSPITAL_COMMUNITY): Payer: BLUE CROSS/BLUE SHIELD | Admitting: Certified Registered Nurse Anesthetist

## 2018-08-05 ENCOUNTER — Ambulatory Visit (HOSPITAL_COMMUNITY)
Admission: RE | Admit: 2018-08-05 | Discharge: 2018-08-05 | Disposition: A | Discharge status: Home / Self Care | Payer: BLUE CROSS/BLUE SHIELD | Source: Ambulatory Visit | Attending: Gastroenterology | Admitting: Gastroenterology

## 2018-08-05 ENCOUNTER — Encounter (HOSPITAL_COMMUNITY): Payer: Self-pay | Admitting: *Deleted

## 2018-08-05 ENCOUNTER — Encounter (HOSPITAL_COMMUNITY)
Admission: RE | Disposition: A | Discharge status: Home / Self Care | Payer: Self-pay | Source: Ambulatory Visit | Attending: Gastroenterology

## 2018-08-05 DIAGNOSIS — Z7901 Long term (current) use of anticoagulants: Secondary | ICD-10-CM | POA: Diagnosis not present

## 2018-08-05 DIAGNOSIS — Z79890 Hormone replacement therapy (postmenopausal): Secondary | ICD-10-CM | POA: Diagnosis not present

## 2018-08-05 DIAGNOSIS — I2510 Atherosclerotic heart disease of native coronary artery without angina pectoris: Secondary | ICD-10-CM | POA: Insufficient documentation

## 2018-08-05 DIAGNOSIS — I4891 Unspecified atrial fibrillation: Secondary | ICD-10-CM | POA: Diagnosis not present

## 2018-08-05 DIAGNOSIS — K319 Disease of stomach and duodenum, unspecified: Secondary | ICD-10-CM | POA: Diagnosis not present

## 2018-08-05 DIAGNOSIS — Z79899 Other long term (current) drug therapy: Secondary | ICD-10-CM | POA: Diagnosis not present

## 2018-08-05 DIAGNOSIS — K7460 Unspecified cirrhosis of liver: Secondary | ICD-10-CM

## 2018-08-05 DIAGNOSIS — K259 Gastric ulcer, unspecified as acute or chronic, without hemorrhage or perforation: Secondary | ICD-10-CM

## 2018-08-05 DIAGNOSIS — K2970 Gastritis, unspecified, without bleeding: Secondary | ICD-10-CM

## 2018-08-05 DIAGNOSIS — K2100 Gastro-esophageal reflux disease with esophagitis, without bleeding: Secondary | ICD-10-CM

## 2018-08-05 DIAGNOSIS — E785 Hyperlipidemia, unspecified: Secondary | ICD-10-CM | POA: Diagnosis not present

## 2018-08-05 DIAGNOSIS — E119 Type 2 diabetes mellitus without complications: Secondary | ICD-10-CM | POA: Diagnosis not present

## 2018-08-05 DIAGNOSIS — K3189 Other diseases of stomach and duodenum: Secondary | ICD-10-CM | POA: Diagnosis not present

## 2018-08-05 DIAGNOSIS — K2990 Gastroduodenitis, unspecified, without bleeding: Secondary | ICD-10-CM

## 2018-08-05 DIAGNOSIS — I509 Heart failure, unspecified: Secondary | ICD-10-CM | POA: Diagnosis not present

## 2018-08-05 DIAGNOSIS — R188 Other ascites: Secondary | ICD-10-CM

## 2018-08-05 HISTORY — PX: BIOPSY: SHX5522

## 2018-08-05 HISTORY — PX: ESOPHAGOGASTRODUODENOSCOPY (EGD) WITH PROPOFOL: SHX5813

## 2018-08-05 SURGERY — ESOPHAGOGASTRODUODENOSCOPY (EGD) WITH PROPOFOL
Anesthesia: Monitor Anesthesia Care

## 2018-08-05 MED ORDER — LACTATED RINGERS IV SOLN
INTRAVENOUS | Status: DC
  Administered 2018-08-05: 12:00:00 via INTRAVENOUS

## 2018-08-05 MED ORDER — SODIUM CHLORIDE 0.9 % IV SOLN: INTRAVENOUS | Status: DC

## 2018-08-05 MED ORDER — PROPOFOL 10 MG/ML IV BOLUS
INTRAVENOUS | Status: AC
  Filled 2018-08-05: qty 60

## 2018-08-05 MED ORDER — PROPOFOL 10 MG/ML IV BOLUS
INTRAVENOUS | Status: DC | PRN
  Administered 2018-08-05: 30 mg via INTRAVENOUS

## 2018-08-05 MED ORDER — FENTANYL CITRATE (PF) 100 MCG/2ML IJ SOLN: 25.0000 ug | INTRAMUSCULAR | Status: DC | PRN

## 2018-08-05 MED ORDER — PROPOFOL 500 MG/50ML IV EMUL
INTRAVENOUS | Status: DC | PRN
  Administered 2018-08-05: 125 ug/kg/min via INTRAVENOUS

## 2018-08-05 MED ORDER — PHENYLEPHRINE 40 MCG/ML (10ML) SYRINGE FOR IV PUSH (FOR BLOOD PRESSURE SUPPORT)
PREFILLED_SYRINGE | INTRAVENOUS | Status: DC | PRN
  Administered 2018-08-05: 120 ug via INTRAVENOUS
  Administered 2018-08-05: 80 ug via INTRAVENOUS
  Administered 2018-08-05 (×4): 120 ug via INTRAVENOUS
  Administered 2018-08-05 (×2): 80 ug via INTRAVENOUS

## 2018-08-05 MED ORDER — EPHEDRINE SULFATE-NACL 50-0.9 MG/10ML-% IV SOSY
PREFILLED_SYRINGE | INTRAVENOUS | Status: DC | PRN
  Administered 2018-08-05: 7.5 mg via INTRAVENOUS

## 2018-08-05 MED ORDER — ONDANSETRON HCL 4 MG/2ML IJ SOLN: 4.0000 mg | Freq: Once | INTRAMUSCULAR | Status: DC | PRN

## 2018-08-05 SURGICAL SUPPLY — 14 items

## 2018-08-05 NOTE — Anesthesia Preprocedure Evaluation (Signed)
Anesthesia Evaluation  Patient identified by MRN, date of birth, ID band Patient awake    Reviewed: Allergy & Precautions, NPO status , Patient's Chart, lab work & pertinent test results  Airway Mallampati: II  TM Distance: >3 FB Neck ROM: Full    Dental  (+) Teeth Intact, Dental Advisory Given   Pulmonary neg pulmonary ROS,    Pulmonary exam normal breath sounds clear to auscultation       Cardiovascular + CAD and +CHF  + dysrhythmias Atrial Fibrillation  Rhythm:Irregular Rate:Abnormal  Echo 01/22/18: Moderately dilated LV with EF 15%, diffuse hypokinesis. Mildly dilated RV with moderately decreased systolic function. Biatrial enlargement. Moderate functional mitral regurgitation. Mild pulmonary hypertension. Dilated IVC suggestive of elevated RV filling pressure.   Neuro/Psych negative neurological ROS     GI/Hepatic negative GI ROS, (+) Cirrhosis   ascites    ,   Endo/Other  diabetes, Well Controlled, Type 2Hypothyroidism   Renal/GU Renal InsufficiencyRenal disease     Musculoskeletal negative musculoskeletal ROS (+)   Abdominal   Peds  Hematology  (+) Blood dyscrasia (Eliquis), ,   Anesthesia Other Findings Day of surgery medications reviewed with the patient.  Reproductive/Obstetrics                             Anesthesia Physical Anesthesia Plan  ASA: IV  Anesthesia Plan: MAC   Post-op Pain Management:    Induction: Intravenous  PONV Risk Score and Plan: 1 and Propofol infusion and Treatment may vary due to age or medical condition  Airway Management Planned: Nasal Cannula and Natural Airway  Additional Equipment:   Intra-op Plan:   Post-operative Plan:   Informed Consent: I have reviewed the patients History and Physical, chart, labs and discussed the procedure including the risks, benefits and alternatives for the proposed anesthesia with the patient or authorized  representative who has indicated his/her understanding and acceptance.   Dental advisory given  Plan Discussed with: CRNA and Anesthesiologist  Anesthesia Plan Comments:         Anesthesia Quick Evaluation

## 2018-08-05 NOTE — Op Note (Signed)
[facility] Patient Name: Cody Schwartz Procedure Date: 08/05/2018 MRN: [id number] Attending MD: Gerrit Heck , MD Date of Birth: November 05, 1955 CSN: [id number] Age: 62 Admit Type: Outpatient Procedure:                Upper GI endoscopy Indications:              Cirrhosis rule out esophageal varices Providers:                Gerrit Heck, MD, Carlyn Reichert, RN, Charolette Child, Technician, Dellie Catholic Referring MD:              Medicines:                Monitored Anesthesia Care Complications:            No immediate complications. Estimated Blood Loss:     Estimated blood loss was minimal. Procedure:                Pre-Anesthesia Assessment:                           - Prior to the procedure, a History and Physical                            was performed, and patient medications and                            allergies were reviewed. The patient's tolerance of                            previous anesthesia was also reviewed. The risks                            and benefits of the procedure and the sedation                            options and risks were discussed with the patient.                            All questions were answered, and informed consent                            was obtained. Prior Anticoagulants: The patient has                            taken Eliquis (apixaban), last dose was 2 days                            prior to procedure. ASA Grade Assessment: IV - A                            patient with severe systemic disease that is a  constant threat to life. After reviewing the risks                            and benefits, the patient was deemed in                            satisfactory condition to undergo the procedure.                           After obtaining informed consent, the endoscope was                            passed under direct vision. Throughout the          procedure, the patient's blood pressure, pulse, and                            oxygen saturations were monitored continuously. The                            GIF-H190 ([id number] Olympus adult endoscope was                            introduced through the mouth, and advanced to the                            second part of duodenum. The upper GI endoscopy was                            accomplished without difficulty. The patient                            tolerated the procedure well. Scope In: Scope Out: Findings:      LA Grade D (one or more mucosal breaks involving at least 75% of       esophageal circumference) esophagitis with active mucosal ooze was found       in the lower third of the esophagus.      The upper third of the esophagus and middle third of the esophagus were       normal.      One non-bleeding cratered gastric ulcer with no stigmata of bleeding was       found in the gastric antrum. The lesion was 10 mm in largest dimension.       Biopsies were taken with a cold forceps for histology. Estimated blood       loss was minimal.      Diffuse mild inflammation characterized by erythema was found in the       gastric antrum. Biopsies were taken with a cold forceps for Helicobacter       pylori testing. Estimated blood loss was minimal.      A large amount of food (residue) was found in the gastric fundus and in       the gastric body.      The duodenal bulb, first portion of the duodenum and second portion of       the duodenum were normal. Impression:               -  LA Grade D reflux esophagitis.                           - Normal upper third of esophagus and middle third                            of esophagus.                           - Non-bleeding gastric ulcer with no stigmata of                            bleeding. Biopsied.                           - Gastritis. Biopsied.                           - A large amount of food (residue) in the stomach.                            - Normal duodenal bulb, first portion of the                            duodenum and second portion of the duodenum. Moderate Sedation:      Not Applicable - Patient had care per Anesthesia. Recommendation:           - Patient has a contact number available for                            emergencies. The signs and symptoms of potential                            delayed complications were discussed with the                            patient. Return to normal activities tomorrow.                            Written discharge instructions were provided to the                            patient.                           - Resume previous diet today.                           - Continue present medications.                           - Await pathology results.                           - Continue present medications.                           -  Use Protonix (pantoprazole) 40 mg PO BID for 8                            weeks.                           - Do a gastric emptying study at appointment to be                            scheduled.                           - Return to GI clinic in 2-4 weeks. Procedure Code(s):        --- Professional ---                           [phone], Esophagogastroduodenoscopy, flexible,                            transoral; with biopsy, single or multiple Diagnosis Code(s):        --- Professional ---                           K21.0, Gastro-esophageal reflux disease with                            esophagitis                           K25.9, Gastric ulcer, unspecified as acute or                            chronic, without hemorrhage or perforation                           K29.70, Gastritis, unspecified, without bleeding                           K74.60, Unspecified cirrhosis of liver CPT copyright 2018 [employer]. All rights reserved. The codes documented in this report are preliminary and upon coder review may  be revised  to meet current compliance requirements. Gerrit Heck, MD 08/05/2018 12:24:25 PM Number of Addenda: 0

## 2018-08-05 NOTE — Discharge Instructions (Signed)
YOU HAD AN ENDOSCOPIC PROCEDURE TODAY: Refer to the procedure report and other information in the discharge instructions given to you for any specific questions about what was found during the examination. If this information does not answer your questions, please call [place] office at [phone] to clarify.  ° °YOU SHOULD EXPECT: Some feelings of bloating in the abdomen. Passage of more gas than usual. Walking can help get rid of the air that was put into your GI tract during the procedure and reduce the bloating. If you had a lower endoscopy (such as a colonoscopy or flexible sigmoidoscopy) you may notice spotting of blood in your stool or on the toilet paper. Some abdominal soreness may be present for a day or two, also. ° °DIET: Your first meal following the procedure should be a light meal and then it is ok to progress to your normal diet. A half-sandwich or bowl of soup is an example of a good first meal. Heavy or fried foods are harder to digest and may make you feel nauseous or bloated. Drink plenty of fluids but you should avoid alcoholic beverages for 24 hours. If you had a esophageal dilation, please see attached instructions for diet.   ° °ACTIVITY: Your care partner should take you home directly after the procedure. You should plan to take it easy, moving slowly for the rest of the day. You can resume normal activity the day after the procedure however YOU SHOULD NOT DRIVE, use power tools, machinery or perform tasks that involve climbing or major physical exertion for 24 hours (because of the sedation medicines used during the test).  ° °SYMPTOMS TO REPORT IMMEDIATELY: °A gastroenterologist can be reached at any hour. Please call [phone]  for any of the following symptoms:  °Following lower endoscopy (colonoscopy, flexible sigmoidoscopy) °Excessive amounts of blood in the stool  °Significant tenderness, worsening of abdominal pains  °Swelling of the abdomen that is new, acute  °Fever of 100° or  higher  °Following upper endoscopy (EGD, EUS, ERCP, esophageal dilation) °Vomiting of blood or coffee ground material  °New, significant abdominal pain  °New, significant chest pain or pain under the shoulder blades  °Painful or persistently difficult swallowing  °New shortness of breath  °Black, tarry-looking or red, bloody stools ° °FOLLOW UP:  °If any biopsies were taken you will be contacted by phone or by letter within the next 1-3 weeks. Call [phone]  if you have not heard about the biopsies in 3 weeks.  °Please also call with any specific questions about appointments or follow up tests. ° °

## 2018-08-05 NOTE — Interval H&P Note (Signed)
History and Physical Interval Note:  08/05/2018 11:56 AM  Cody Schwartz  has presented today for surgery, with the diagnosis of cirrhosis with ascites  The various methods of treatment have been discussed with the patient and family. After consideration of risks, benefits and other options for treatment, the patient has consented to  Procedure(s): ESOPHAGOGASTRODUODENOSCOPY (EGD) WITH PROPOFOL (N/A) as a surgical intervention .  The patient's history has been reviewed, patient examined, no change in status, stable for surgery.  I have reviewed the patient's chart and labs.  Questions were answered to the patient's satisfaction.     Dominic Pea Cirigliano

## 2018-08-05 NOTE — Transfer of Care (Signed)
Immediate Anesthesia Transfer of Care Note  Patient: Cody Schwartz  Procedure(s) Performed: ESOPHAGOGASTRODUODENOSCOPY (EGD) WITH PROPOFOL (N/A ) BIOPSY  Patient Location: Endoscopy Unit  Anesthesia Type:MAC  Level of Consciousness: awake, alert , oriented and patient cooperative  Airway & Oxygen Therapy: Patient Spontanous Breathing and Patient connected to face mask  Post-op Assessment: Report given to RN and Post -op Vital signs reviewed and stable  Post vital signs: Reviewed and stable  Last Vitals:  Vitals Value Taken Time  BP 79/39 08/05/2018 12:35 PM  Temp    Pulse 72 08/05/2018 12:30 PM  Resp 25 08/05/2018 12:37 PM  SpO2 100 % 08/05/2018 12:35 PM  Vitals shown include unvalidated device data.  Last Pain:  Vitals:   08/05/18 1235  TempSrc: Oral  PainSc: 0-No pain         Complications: No apparent anesthesia complications

## 2018-08-05 NOTE — Anesthesia Postprocedure Evaluation (Signed)
Anesthesia Post Note  Patient: Cody Schwartz  Procedure(s) Performed: ESOPHAGOGASTRODUODENOSCOPY (EGD) WITH PROPOFOL (N/A ) BIOPSY     Patient location during evaluation: Endoscopy Anesthesia Type: MAC Level of consciousness: awake and alert, awake and oriented Pain management: pain level controlled Vital Signs Assessment: post-procedure vital signs reviewed and stable Respiratory status: spontaneous breathing, nonlabored ventilation and respiratory function stable Cardiovascular status: stable and blood pressure returned to baseline Postop Assessment: no apparent nausea or vomiting Anesthetic complications: no    Last Vitals:  Vitals:   08/05/18 1300 08/05/18 1330  BP: (!) 93/59   Pulse: 97   Resp: 19   Temp:  (!) 34.4 C  SpO2: 99%     Last Pain:  Vitals:   08/05/18 1330  TempSrc: Axillary  PainSc:                  Catalina Gravel

## 2018-08-06 ENCOUNTER — Telehealth: Payer: Self-pay | Admitting: Gastroenterology

## 2018-08-06 ENCOUNTER — Other Ambulatory Visit: Payer: Self-pay

## 2018-08-06 ENCOUNTER — Encounter (HOSPITAL_COMMUNITY): Payer: Self-pay | Admitting: Gastroenterology

## 2018-08-06 MED ORDER — PANTOPRAZOLE SODIUM 40 MG PO TBEC: 40.0000 mg | DELAYED_RELEASE_TABLET | Freq: Two times a day (BID) | ORAL | 0 refills | Status: DC

## 2018-08-06 NOTE — Telephone Encounter (Signed)
Notified patient of Protonix 40mg  BID x8weeks being sent to Pharmacy.

## 2018-08-08 ENCOUNTER — Encounter: Payer: Self-pay | Admitting: Gastroenterology

## 2018-08-08 DIAGNOSIS — R188 Other ascites: Secondary | ICD-10-CM | POA: Diagnosis not present

## 2018-08-08 DIAGNOSIS — I509 Heart failure, unspecified: Secondary | ICD-10-CM | POA: Diagnosis not present

## 2018-08-13 ENCOUNTER — Ambulatory Visit: Payer: BLUE CROSS/BLUE SHIELD | Admitting: Gastroenterology

## 2018-08-15 ENCOUNTER — Ambulatory Visit (INDEPENDENT_AMBULATORY_CARE_PROVIDER_SITE_OTHER): Payer: BLUE CROSS/BLUE SHIELD | Admitting: Gastroenterology

## 2018-08-15 ENCOUNTER — Other Ambulatory Visit (INDEPENDENT_AMBULATORY_CARE_PROVIDER_SITE_OTHER): Payer: BLUE CROSS/BLUE SHIELD

## 2018-08-15 ENCOUNTER — Encounter: Payer: Self-pay | Admitting: Gastroenterology

## 2018-08-15 VITALS — BP 96/54 | HR 62 | Ht 67.0 in | Wt 165.2 lb

## 2018-08-15 DIAGNOSIS — K2100 Gastro-esophageal reflux disease with esophagitis, without bleeding: Secondary | ICD-10-CM

## 2018-08-15 DIAGNOSIS — K7460 Unspecified cirrhosis of liver: Secondary | ICD-10-CM

## 2018-08-15 DIAGNOSIS — I509 Heart failure, unspecified: Secondary | ICD-10-CM | POA: Diagnosis not present

## 2018-08-15 DIAGNOSIS — R188 Other ascites: Secondary | ICD-10-CM

## 2018-08-15 DIAGNOSIS — N189 Chronic kidney disease, unspecified: Secondary | ICD-10-CM

## 2018-08-15 DIAGNOSIS — D1803 Hemangioma of intra-abdominal structures: Secondary | ICD-10-CM

## 2018-08-15 LAB — COMPREHENSIVE METABOLIC PANEL
ALBUMIN: 3.4 g/dL — AB (ref 3.5–5.2)
ALK PHOS: 145 U/L — AB (ref 39–117)
ALT: 14 U/L (ref 0–53)
AST: 20 U/L (ref 0–37)
BILIRUBIN TOTAL: 1.5 mg/dL — AB (ref 0.2–1.2)
BUN: 74 mg/dL — ABNORMAL HIGH (ref 6–23)
CALCIUM: 8.9 mg/dL (ref 8.4–10.5)
CO2: 20 mEq/L (ref 19–32)
Chloride: 91 mEq/L — ABNORMAL LOW (ref 96–112)
Creatinine, Ser: 2.29 mg/dL — ABNORMAL HIGH (ref 0.40–1.50)
GFR: 30.67 mL/min — AB (ref >60.00)
GLUCOSE: 140 mg/dL — AB (ref 70–99)
POTASSIUM: 5.7 meq/L — AB (ref 3.5–5.1)
Sodium: 125 mEq/L — ABNORMAL LOW (ref 135–145)
TOTAL PROTEIN: 6.3 g/dL (ref 6.0–8.3)

## 2018-08-15 NOTE — Progress Notes (Signed)
P  Chief Complaint:    Cirrhosis, ascites  GI History: Cody Schwartz is a 62 year old very pleasant male [employer] initially seen by me in the GI clinic on 07/11/2018 for cirrhosis and ascites evaluation.  He has a history of CHF (TTE 01/2018 with EF of 15% and severe RV dysfunction) due to ICM, CAD S/P PCI with DES in 09/2015, CKD, moderate pulmonary hypertension.  He has a history of complex, recurrent/refractory ascites treated with multiple diuretic agents along with serial paracentesis.  Liver biopsy completed and notable for congestive hepatopathy without evidence of Amyloidosis.  EGD completed and without esophageal varices.  Cirrhosis history: -Etiology:  Biopsy-proven congestive hepatopathy; extended serologic evaluation otherwise negative/unrevealing -Complications: Ascites requiring serial paracentesis; no SBP -Last EGD/variceal screening:  08/05/2018 -Vaccinations: Obtaining records -[place] screening: RUQ ultrasound 03/2018 with stable hyperechoic lesion in right liver lobe measuring 1.4 centimeters, presumed hemangioma.  Otherwise no new/suspicious lesions, diffusely heterogenous and coarsened echotexture with peripheral contours and nodularity consistent with cirrhosis, normal Doppler, ascites -MELD score: 25 -Child Pugh score: B (8 pts)  Intolerant to beta-blocker per Cards notes, but patient unsure about details regarding this (took Coreg for 2 years).  Stopped spironolactone due to urticaria.  Endoscopic history: - EGD (08/05/2018, Dr. Bryan Lemma): LA grade D esophagitis, 10 mm antral ulcer (biopsy: Gastritis without H. pylori), portal hypertensive gastropathy, normal duodenum, food residue in gastric fundus/body, no esophageal varices.  Recommended gastric emptying study. - Colonoscopy (2014, Dr. Lyda Jester): Normal per patient  HPI:     Patient is a 62 y.o. male presenting to the Gastroenterology Clinic for follow-up.   Since his last appointment, liver  biopsy completed and consistent with congestive hepatopathy without any evidence of amyloidosis.  Extended serologic evaluation was otherwise unrevealing for concomitant liver disease.  EGD completed and no evidence of esophageal varices but did have LA grade D esophagitis and portal hypertensive gastropathy.  Food was noted in the gastric body and fundus and recommended gastric emptying study.  Additionally, he was evaluated by Dr. Justin Mend at [facility] on 08/04/2018.  Sincerely appreciate his input and recommendations.  CKD secondary to cirrhosis and possible hepatorenal element with current stable renal function and clinical euvolemia.  No plan for dialysis and recommended continuation of current diuretics along with eplerenone and midodrine for BP support, continue low-sodium diet, added sodium bicarbonate and potassium supplements with plan to recheck can panel in a week and follow-up in the clinic in 1 month.  He did discuss end-of-life issues at that appointment.  Was also referred to dermatology for assistance with lower extremity wounds.  Today he states he is overall feeling okay.  Last paracentesis was 1 week ago, with 9 L removed.  Otherwise, no abdominal pain, nausea, vomiting, change in bowel habits.  Tolerating p.o. intake and medications without issue.  Has employed a low-sodium diet.    Review of systems:     No chest pain, no SOB, no fevers, no urinary sx   Past Medical History:  Diagnosis Date  . Congestive heart failure (CHF) ([place])   . Hyperlipidemia   . Hypokalemia     Patient's surgical history, family medical history, social history, medications and allergies were all reviewed in Epic    Current Outpatient Medications  Medication Sig Dispense Refill  . allopurinol (ZYLOPRIM) 100 MG tablet Take 100 mg by mouth daily. Take with 300 mg to equal 400 mg daily  12  . allopurinol (ZYLOPRIM) 300 MG tablet Take 300 mg by mouth  daily. Take with 100 mg to equal 400 mg daily  12  .  apixaban (ELIQUIS) 5 MG TABS tablet Take 1 tablet (5 mg total) by mouth 2 (two) times daily. 60 tablet 3  . cephALEXin (KEFLEX) 250 MG capsule Take 1 capsule (250 mg total) by mouth 4 (four) times daily. Take for five days 20 capsule 0  . eplerenone (INSPRA) 25 MG tablet Take 1 tablet (25 mg total) by mouth 2 (two) times daily. 180 tablet 3  . levothyroxine (SYNTHROID, LEVOTHROID) 125 MCG tablet Take 125 mcg by mouth daily before breakfast.   3  . midodrine (PROAMATINE) 10 MG tablet Take 1 tablet (10 mg total) by mouth 3 (three) times daily with meals. 270 tablet 3  . nitroGLYCERIN (NITROSTAT) 0.4 MG SL tablet Place 1 tablet (0.4 mg total) under the tongue every 5 (five) minutes as needed for chest pain. 25 tablet 2  . pantoprazole (PROTONIX) 40 MG tablet Take 1 tablet (40 mg total) by mouth 2 (two) times daily. 112 tablet 0  . Potassium Chloride 40 MEQ/15ML (20%) SOLN Take 22.5 mLs by mouth 3 (three) times daily AND 15 mLs 3 (three) times a week. MON, WED, FRI. (Patient taking differently: Take 15 ml 3 times daily) 4050 mL 3  . pravastatin (PRAVACHOL) 40 MG tablet Take 1 tablet (40 mg total) by mouth daily. 30 tablet 11  . torsemide (DEMADEX) 100 MG tablet Take 100 mg by mouth daily.    [name] torsemide (DEMADEX) 20 MG tablet Take 5 tabs in AM and 4 tabs in PM (Patient taking differently: Take 80 mg by mouth every evening. ) 270 tablet 11  . Vitamin D, Ergocalciferol, (DRISDOL) 50000 units CAPS capsule Take 50,000 Units by mouth every Monday.     . metolazone (ZAROXOLYN) 5 MG tablet Take 1 tablet (5 mg total) by mouth 3 (three) times a week. Every Monday, Wed and Friday (Patient taking differently: Take 5 mg by mouth every Monday, Wednesday, and Friday. ) 15 tablet 3   No current facility-administered medications for this visit.     Physical Exam:     BP (!) 96/54   Pulse 62   Ht 5\' 7"  (1.702 m)   Wt 165 lb 3.2 oz (74.9 kg)   BMI 25.87 kg/m   GENERAL:  Pleasant male in NAD PSYCH: :  Cooperative, normal affect EENT:  conjunctiva pink, mucous membranes moist, neck supple without masses CARDIAC:  RRR, no murmur heard, no peripheral edema PULM: Normal respiratory effort, lungs CTA bilaterally, no wheezing ABDOMEN:  Nondistended, soft, nontender. No obvious masses, no hepatomegaly,  normal bowel sounds SKIN:  turgor, no lesions seen Musculoskeletal:  Normal muscle tone, normal strength NEURO: Alert and oriented x 3, no focal neurologic deficits   IMPRESSION and PLAN:    #1.  Cirrhosis, secondary to congestive hepatopathy: Now with biopsy-proven congestive hepatopathy.  Otherwise, extended serologic evaluation for concomitant liver disease was unrevealing.  No amyloidosis noted on liver biopsy.  Had a long discussion regarding his diagnosis and prognosis, with particular respect to underlying etiology of heart failure and complication of renal disease.  He does not want to proceed with referral to transplant hepatology.   - Repeat BMP today - Follow-up in the GI clinic in 2 to 3 months or sooner as needed - Resume low-sodium diet - Repeat imaging for [facility] screening at follow-up appointment.  Obtain MRI liver. - Needs hepatitis A and hepatitis B vaccinations - Yearly flu vaccine - Does not want  to proceed with referral to transplant hepatology - Resume diuretics and BP management per cardiologist and nephrologist - Resume serial large volume therapeutic paracentesis      #2.  CKD: Was evaluated by Dr. Justin Mend with input regarding long and short-term management of CKD with regards to overall volume management.  Certainly difficult underlying hemodynamics with regards to overall volume up but intravascular depletion/hypotension.  - Repeat BMP today as recommended by Dr. Justin Mend  #3.  CHF: Follows closely with Dr. Haroldine Laws with follow-up appointment already scheduled.  Diuretics and BP management per Dr. Haroldine Laws and Dr. Justin Mend.  #4.  Hemangioma: RUS with likely hemangioma.  Will obtain MRI Liver with next [facility] screen which will provide more definitive diagnostic characteristics  #5.  GERD with Erosive Esophagitis: Recent EGD with LA grade D esophagitis.  Reflux symptoms well controlled with PPI therapy.  - Resume Protonix 40 mg p.o. twice daily x8 weeks then we will plan on reducing to lowest effective dose for ongoing reflux management.  I spent a total of 25 minutes of face-to-face time with the patient. Greater than 50% of the time was spent counseling and coordinating care.        Lavena Bullion ,DO, FACG 08/15/2018, 9:47 AM

## 2018-08-15 NOTE — Patient Instructions (Signed)
If you are age 64 or older, your body mass index should be between 23-30. Your Body mass index is 25.87 kg/m. If this is out of the aforementioned range listed, please consider follow up with your Primary Care Provider.  If you are age 29 or younger, your body mass index should be between 19-25. Your Body mass index is 25.87 kg/m. If this is out of the aformentioned range listed, please consider follow up with your Primary Care Provider.   Please go to the lab on the 2nd floor [address] before you leave the office today.   Thank you,  Dr. Jackquline Denmark

## 2018-08-15 NOTE — Addendum Note (Signed)
Addended by: Caffie Pinto on: 08/15/2018 10:51 AM   Modules accepted: Orders

## 2018-08-16 LAB — IRON,TIBC AND FERRITIN PANEL
%SAT: 20 % (ref 20–48)
FERRITIN: 543 ng/mL — AB (ref 24–380)
Iron: 47 ug/dL — ABNORMAL LOW (ref 50–180)
TIBC: 234 mcg/dL (calc) — ABNORMAL LOW (ref 250–425)

## 2018-08-18 ENCOUNTER — Telehealth: Payer: Self-pay | Admitting: Gastroenterology

## 2018-08-18 NOTE — Telephone Encounter (Signed)
Left message for patient to call back; If the patient is referring to his lab work completed on 11/22 -please advise on information If the patient is referring to his patho results -letter will be explained to the patinet; Please advise-

## 2018-08-18 NOTE — Telephone Encounter (Signed)
Left message for patient to call back  

## 2018-08-18 NOTE — Telephone Encounter (Signed)
Thanks. He had labs drawn Friday, and notable for mild worsening of renal function and elevated K+ at 5.7. I sent a message with the lab results to his Nephrologist and Cardiologist (Dr. Justin Mend and Dr. Haroldine Laws).   Can you send him to his closest lab and order for a repeat BMP collect in a green top, heparinized tube.   Thank you.

## 2018-08-19 ENCOUNTER — Encounter (HOSPITAL_COMMUNITY): Payer: Self-pay

## 2018-08-20 ENCOUNTER — Telehealth (HOSPITAL_COMMUNITY): Payer: Self-pay

## 2018-08-20 ENCOUNTER — Other Ambulatory Visit: Payer: Self-pay

## 2018-08-20 ENCOUNTER — Encounter (HOSPITAL_COMMUNITY): Payer: Self-pay | Admitting: *Deleted

## 2018-08-20 ENCOUNTER — Emergency Department (HOSPITAL_COMMUNITY): Payer: BLUE CROSS/BLUE SHIELD

## 2018-08-20 ENCOUNTER — Inpatient Hospital Stay (HOSPITAL_COMMUNITY): Payer: BLUE CROSS/BLUE SHIELD

## 2018-08-20 ENCOUNTER — Inpatient Hospital Stay (HOSPITAL_COMMUNITY)
Admission: EM | Admit: 2018-08-20 | Discharge: 2018-08-28 | DRG: 871 | Disposition: A | Discharge status: Hospice | Payer: BLUE CROSS/BLUE SHIELD | Attending: Pulmonary Disease | Admitting: Pulmonary Disease

## 2018-08-20 DIAGNOSIS — R531 Weakness: Secondary | ICD-10-CM

## 2018-08-20 DIAGNOSIS — R570 Cardiogenic shock: Secondary | ICD-10-CM | POA: Diagnosis not present

## 2018-08-20 DIAGNOSIS — K219 Gastro-esophageal reflux disease without esophagitis: Secondary | ICD-10-CM | POA: Diagnosis present

## 2018-08-20 DIAGNOSIS — N183 Chronic kidney disease, stage 3 (moderate): Secondary | ICD-10-CM | POA: Diagnosis present

## 2018-08-20 DIAGNOSIS — R0902 Hypoxemia: Secondary | ICD-10-CM | POA: Diagnosis not present

## 2018-08-20 DIAGNOSIS — E872 Acidosis: Secondary | ICD-10-CM | POA: Diagnosis present

## 2018-08-20 DIAGNOSIS — I4819 Other persistent atrial fibrillation: Secondary | ICD-10-CM | POA: Diagnosis not present

## 2018-08-20 DIAGNOSIS — R188 Other ascites: Secondary | ICD-10-CM | POA: Diagnosis present

## 2018-08-20 DIAGNOSIS — I959 Hypotension, unspecified: Secondary | ICD-10-CM

## 2018-08-20 DIAGNOSIS — E785 Hyperlipidemia, unspecified: Secondary | ICD-10-CM | POA: Diagnosis present

## 2018-08-20 DIAGNOSIS — A419 Sepsis, unspecified organism: Secondary | ICD-10-CM | POA: Diagnosis not present

## 2018-08-20 DIAGNOSIS — E039 Hypothyroidism, unspecified: Secondary | ICD-10-CM | POA: Diagnosis present

## 2018-08-20 DIAGNOSIS — Z955 Presence of coronary angioplasty implant and graft: Secondary | ICD-10-CM

## 2018-08-20 DIAGNOSIS — I5084 End stage heart failure: Secondary | ICD-10-CM | POA: Diagnosis present

## 2018-08-20 DIAGNOSIS — L89152 Pressure ulcer of sacral region, stage 2: Secondary | ICD-10-CM | POA: Diagnosis present

## 2018-08-20 DIAGNOSIS — Z66 Do not resuscitate: Secondary | ICD-10-CM | POA: Diagnosis present

## 2018-08-20 DIAGNOSIS — I5022 Chronic systolic (congestive) heart failure: Secondary | ICD-10-CM | POA: Diagnosis not present

## 2018-08-20 DIAGNOSIS — I5042 Chronic combined systolic (congestive) and diastolic (congestive) heart failure: Secondary | ICD-10-CM | POA: Diagnosis present

## 2018-08-20 DIAGNOSIS — N179 Acute kidney failure, unspecified: Secondary | ICD-10-CM | POA: Diagnosis not present

## 2018-08-20 DIAGNOSIS — E1122 Type 2 diabetes mellitus with diabetic chronic kidney disease: Secondary | ICD-10-CM | POA: Diagnosis present

## 2018-08-20 DIAGNOSIS — Z7189 Other specified counseling: Secondary | ICD-10-CM | POA: Diagnosis not present

## 2018-08-20 DIAGNOSIS — E854 Organ-limited amyloidosis: Secondary | ICD-10-CM | POA: Diagnosis present

## 2018-08-20 DIAGNOSIS — R6521 Severe sepsis with septic shock: Secondary | ICD-10-CM | POA: Diagnosis present

## 2018-08-20 DIAGNOSIS — Z451 Encounter for adjustment and management of infusion pump: Secondary | ICD-10-CM | POA: Diagnosis not present

## 2018-08-20 DIAGNOSIS — I130 Hypertensive heart and chronic kidney disease with heart failure and stage 1 through stage 4 chronic kidney disease, or unspecified chronic kidney disease: Secondary | ICD-10-CM | POA: Diagnosis not present

## 2018-08-20 DIAGNOSIS — I2510 Atherosclerotic heart disease of native coronary artery without angina pectoris: Secondary | ICD-10-CM | POA: Diagnosis not present

## 2018-08-20 DIAGNOSIS — I482 Chronic atrial fibrillation, unspecified: Secondary | ICD-10-CM | POA: Diagnosis not present

## 2018-08-20 DIAGNOSIS — K652 Spontaneous bacterial peritonitis: Secondary | ICD-10-CM

## 2018-08-20 DIAGNOSIS — K210 Gastro-esophageal reflux disease with esophagitis: Secondary | ICD-10-CM | POA: Diagnosis present

## 2018-08-20 DIAGNOSIS — E7800 Pure hypercholesterolemia, unspecified: Secondary | ICD-10-CM | POA: Diagnosis not present

## 2018-08-20 DIAGNOSIS — E871 Hypo-osmolality and hyponatremia: Secondary | ICD-10-CM | POA: Diagnosis present

## 2018-08-20 DIAGNOSIS — K761 Chronic passive congestion of liver: Secondary | ICD-10-CM | POA: Diagnosis present

## 2018-08-20 DIAGNOSIS — Z791 Long term (current) use of non-steroidal anti-inflammatories (NSAID): Secondary | ICD-10-CM

## 2018-08-20 DIAGNOSIS — E861 Hypovolemia: Secondary | ICD-10-CM | POA: Diagnosis present

## 2018-08-20 DIAGNOSIS — E875 Hyperkalemia: Secondary | ICD-10-CM | POA: Diagnosis present

## 2018-08-20 DIAGNOSIS — I9589 Other hypotension: Secondary | ICD-10-CM

## 2018-08-20 DIAGNOSIS — L97811 Non-pressure chronic ulcer of other part of right lower leg limited to breakdown of skin: Secondary | ICD-10-CM | POA: Diagnosis present

## 2018-08-20 DIAGNOSIS — L97821 Non-pressure chronic ulcer of other part of left lower leg limited to breakdown of skin: Secondary | ICD-10-CM | POA: Diagnosis not present

## 2018-08-20 DIAGNOSIS — J939 Pneumothorax, unspecified: Secondary | ICD-10-CM

## 2018-08-20 DIAGNOSIS — R0600 Dyspnea, unspecified: Secondary | ICD-10-CM

## 2018-08-20 DIAGNOSIS — K7290 Hepatic failure, unspecified without coma: Secondary | ICD-10-CM | POA: Diagnosis present

## 2018-08-20 DIAGNOSIS — Z515 Encounter for palliative care: Secondary | ICD-10-CM | POA: Diagnosis not present

## 2018-08-20 DIAGNOSIS — Z823 Family history of stroke: Secondary | ICD-10-CM

## 2018-08-20 DIAGNOSIS — I272 Pulmonary hypertension, unspecified: Secondary | ICD-10-CM | POA: Diagnosis not present

## 2018-08-20 DIAGNOSIS — E876 Hypokalemia: Secondary | ICD-10-CM | POA: Diagnosis present

## 2018-08-20 DIAGNOSIS — M6281 Muscle weakness (generalized): Secondary | ICD-10-CM | POA: Diagnosis present

## 2018-08-20 DIAGNOSIS — Z7901 Long term (current) use of anticoagulants: Secondary | ICD-10-CM

## 2018-08-20 DIAGNOSIS — I50814 Right heart failure due to left heart failure: Secondary | ICD-10-CM | POA: Diagnosis not present

## 2018-08-20 DIAGNOSIS — I255 Ischemic cardiomyopathy: Secondary | ICD-10-CM | POA: Diagnosis not present

## 2018-08-20 DIAGNOSIS — R279 Unspecified lack of coordination: Secondary | ICD-10-CM | POA: Diagnosis not present

## 2018-08-20 DIAGNOSIS — I420 Dilated cardiomyopathy: Secondary | ICD-10-CM | POA: Diagnosis present

## 2018-08-20 DIAGNOSIS — I43 Cardiomyopathy in diseases classified elsewhere: Secondary | ICD-10-CM | POA: Diagnosis not present

## 2018-08-20 DIAGNOSIS — J9811 Atelectasis: Secondary | ICD-10-CM | POA: Diagnosis not present

## 2018-08-20 DIAGNOSIS — R0602 Shortness of breath: Secondary | ICD-10-CM | POA: Diagnosis not present

## 2018-08-20 DIAGNOSIS — I5082 Biventricular heart failure: Secondary | ICD-10-CM | POA: Diagnosis not present

## 2018-08-20 DIAGNOSIS — Z743 Need for continuous supervision: Secondary | ICD-10-CM | POA: Diagnosis not present

## 2018-08-20 DIAGNOSIS — Z79890 Hormone replacement therapy (postmenopausal): Secondary | ICD-10-CM

## 2018-08-20 DIAGNOSIS — Z79899 Other long term (current) drug therapy: Secondary | ICD-10-CM

## 2018-08-20 DIAGNOSIS — Z8249 Family history of ischemic heart disease and other diseases of the circulatory system: Secondary | ICD-10-CM

## 2018-08-20 DIAGNOSIS — Z888 Allergy status to other drugs, medicaments and biological substances status: Secondary | ICD-10-CM

## 2018-08-20 DIAGNOSIS — L8990 Pressure ulcer of unspecified site, unspecified stage: Secondary | ICD-10-CM

## 2018-08-20 DIAGNOSIS — Z452 Encounter for adjustment and management of vascular access device: Secondary | ICD-10-CM

## 2018-08-20 DIAGNOSIS — N189 Chronic kidney disease, unspecified: Secondary | ICD-10-CM | POA: Diagnosis not present

## 2018-08-20 LAB — HEPATIC FUNCTION PANEL
ALK PHOS: 132 U/L — AB (ref 38–126)
ALT: 27 U/L (ref 0–44)
AST: 58 U/L — AB (ref 15–41)
Albumin: 3.1 g/dL — ABNORMAL LOW (ref 3.5–5.0)
BILIRUBIN TOTAL: 1.9 mg/dL — AB (ref 0.3–1.2)
Bilirubin, Direct: 1 mg/dL — ABNORMAL HIGH (ref 0.0–0.2)
Indirect Bilirubin: 0.9 mg/dL (ref 0.3–0.9)
Total Protein: 6.8 g/dL (ref 6.5–8.1)

## 2018-08-20 LAB — SODIUM, URINE, RANDOM: Sodium, Ur: <10 mmol/L

## 2018-08-20 LAB — BASIC METABOLIC PANEL
ANION GAP: 10 (ref 5–15)
Anion gap: 15 (ref 5–15)
BUN: 94 mg/dL — AB (ref 8–23)
BUN: 90 mg/dL — ABNORMAL HIGH (ref 8–23)
CALCIUM: 8.7 mg/dL — AB (ref 8.9–10.3)
CHLORIDE: 96 mmol/L — AB (ref 98–111)
CHLORIDE: 104 mmol/L (ref 98–111)
CO2: 15 mmol/L — AB (ref 22–32)
CO2: 14 mmol/L — ABNORMAL LOW (ref 22–32)
CREATININE: 3.37 mg/dL — AB (ref 0.61–1.24)
Calcium: 9.2 mg/dL (ref 8.9–10.3)
Creatinine, Ser: 3.25 mg/dL — ABNORMAL HIGH (ref 0.61–1.24)
GFR calc Af Amer: 21 mL/min — ABNORMAL LOW (ref >60)
GFR calc non Af Amer: 18 mL/min — ABNORMAL LOW (ref >60)
GFR calc non Af Amer: 19 mL/min — ABNORMAL LOW (ref >60)
GFR, EST AFRICAN AMERICAN: 22 mL/min — AB (ref >60)
Glucose, Bld: 111 mg/dL — ABNORMAL HIGH (ref 70–99)
Glucose, Bld: 82 mg/dL (ref 70–99)
Potassium: 7 mmol/L (ref 3.5–5.1)
Potassium: 6.4 mmol/L (ref 3.5–5.1)
SODIUM: 128 mmol/L — AB (ref 135–145)
Sodium: 126 mmol/L — ABNORMAL LOW (ref 135–145)

## 2018-08-20 LAB — DIFFERENTIAL
Basophils Absolute: 0 10*3/uL (ref 0.0–0.1)
Basophils Relative: 0 %
EOS PCT: 0 %
Eosinophils Absolute: 0 10*3/uL (ref 0.0–0.5)
Lymphocytes Relative: 1 %
Lymphs Abs: 0.3 10*3/uL — ABNORMAL LOW (ref 0.7–4.0)
Monocytes Absolute: 0.7 10*3/uL (ref 0.1–1.0)
Monocytes Relative: 3 %
NEUTROS ABS: 19.5 10*3/uL — AB (ref 1.7–7.7)
NEUTROS PCT: 94 %

## 2018-08-20 LAB — CBC
HEMATOCRIT: 34.4 % — AB (ref 39.0–52.0)
HEMOGLOBIN: 11.2 g/dL — AB (ref 13.0–17.0)
MCH: 31.7 pg (ref 26.0–34.0)
MCHC: 32.6 g/dL (ref 30.0–36.0)
MCV: 97.5 fL (ref 80.0–100.0)
Platelets: 215 10*3/uL (ref 150–400)
RBC: 3.53 MIL/uL — ABNORMAL LOW (ref 4.22–5.81)
RDW: 15.2 % (ref 11.5–15.5)
WBC: 20.8 10*3/uL — AB (ref 4.0–10.5)
nRBC: 0.2 % (ref 0.0–0.2)

## 2018-08-20 LAB — URINALYSIS, ROUTINE W REFLEX MICROSCOPIC
BACTERIA UA: NONE SEEN
Bilirubin Urine: NEGATIVE
Glucose, UA: NEGATIVE mg/dL
Ketones, ur: NEGATIVE mg/dL
Nitrite: NEGATIVE
PROTEIN: NEGATIVE mg/dL
SPECIFIC GRAVITY, URINE: 1.012 (ref 1.005–1.030)
pH: 5 (ref 5.0–8.0)

## 2018-08-20 LAB — LACTIC ACID, PLASMA
LACTIC ACID, VENOUS: 2 mmol/L — AB (ref 0.5–1.9)
LACTIC ACID, VENOUS: 2.5 mmol/L — AB (ref 0.5–1.9)
Lactic Acid, Venous: 2 mmol/L (ref 0.5–1.9)

## 2018-08-20 LAB — I-STAT TROPONIN, ED: Troponin i, poc: 0.02 ng/mL (ref 0.00–0.08)

## 2018-08-20 LAB — BRAIN NATRIURETIC PEPTIDE: B Natriuretic Peptide: 1090.4 pg/mL — ABNORMAL HIGH (ref 0.0–100.0)

## 2018-08-20 LAB — MRSA PCR SCREENING: MRSA by PCR: NEGATIVE

## 2018-08-20 LAB — CBG MONITORING, ED: GLUCOSE-CAPILLARY: 107 mg/dL — AB (ref 70–99)

## 2018-08-20 LAB — AMMONIA: Ammonia: 14 umol/L (ref 9–35)

## 2018-08-20 LAB — POTASSIUM: Potassium: 6.9 mmol/L (ref 3.5–5.1)

## 2018-08-20 LAB — CREATININE, URINE, RANDOM: Creatinine, Urine: 107.23 mg/dL

## 2018-08-20 LAB — OSMOLALITY, URINE: Osmolality, Ur: 319 mOsm/kg (ref 300–900)

## 2018-08-20 LAB — I-STAT CG4 LACTIC ACID, ED: LACTIC ACID, VENOUS: 2.13 mmol/L — AB (ref 0.5–1.9)

## 2018-08-20 MED ORDER — SODIUM CHLORIDE 0.9 % IV BOLUS
1000.0000 mL | Freq: Once | INTRAVENOUS | Status: AC
  Administered 2018-08-20: 1000 mL via INTRAVENOUS

## 2018-08-20 MED ORDER — NITROGLYCERIN 0.4 MG SL SUBL: 0.4000 mg | SUBLINGUAL_TABLET | SUBLINGUAL | Status: DC | PRN

## 2018-08-20 MED ORDER — SENNOSIDES-DOCUSATE SODIUM 8.6-50 MG PO TABS: 1.0000 | ORAL_TABLET | Freq: Every evening | ORAL | Status: DC | PRN

## 2018-08-20 MED ORDER — MIDODRINE HCL 5 MG PO TABS
10.0000 mg | ORAL_TABLET | Freq: Two times a day (BID) | ORAL | Status: DC
  Administered 2018-08-20 – 2018-08-21 (×2): 10 mg via ORAL
  Filled 2018-08-20 (×2): qty 2

## 2018-08-20 MED ORDER — HEPARIN SODIUM (PORCINE) 5000 UNIT/ML IJ SOLN
5000.0000 [IU] | Freq: Three times a day (TID) | INTRAMUSCULAR | Status: DC
  Administered 2018-08-20 – 2018-08-25 (×15): 5000 [IU] via SUBCUTANEOUS
  Filled 2018-08-20 (×16): qty 1

## 2018-08-20 MED ORDER — PIPERACILLIN-TAZOBACTAM 3.375 G IVPB 30 MIN
3.3750 g | Freq: Once | INTRAVENOUS | Status: AC
  Administered 2018-08-20: 3.375 g via INTRAVENOUS
  Filled 2018-08-20: qty 50

## 2018-08-20 MED ORDER — DEXTROSE 50 % IV SOLN
1.0000 | Freq: Once | INTRAVENOUS | Status: DC
  Administered 2018-08-20: 50 mL via INTRAVENOUS
  Filled 2018-08-20: qty 50

## 2018-08-20 MED ORDER — SODIUM BICARBONATE 8.4 % IV SOLN
50.0000 meq | Freq: Once | INTRAVENOUS | Status: AC
  Administered 2018-08-20: 50 meq via INTRAVENOUS
  Filled 2018-08-20: qty 50

## 2018-08-20 MED ORDER — PRAVASTATIN SODIUM 20 MG PO TABS
40.0000 mg | ORAL_TABLET | Freq: Every day | ORAL | Status: DC
  Administered 2018-08-20 – 2018-08-24 (×3): 40 mg via ORAL
  Filled 2018-08-20 (×4): qty 2

## 2018-08-20 MED ORDER — SODIUM CHLORIDE 0.9 % IV SOLN
Freq: Once | INTRAVENOUS | Status: AC
  Administered 2018-08-20: 18:00:00 via INTRAVENOUS

## 2018-08-20 MED ORDER — SODIUM ZIRCONIUM CYCLOSILICATE 10 G PO PACK
10.0000 g | PACK | Freq: Three times a day (TID) | ORAL | Status: DC
  Administered 2018-08-20 – 2018-08-22 (×6): 10 g via ORAL
  Filled 2018-08-20 (×7): qty 1

## 2018-08-20 MED ORDER — TRAMADOL HCL 50 MG PO TABS
50.0000 mg | ORAL_TABLET | Freq: Two times a day (BID) | ORAL | Status: DC | PRN
  Administered 2018-08-20 – 2018-08-21 (×2): 50 mg via ORAL
  Filled 2018-08-20 (×2): qty 1

## 2018-08-20 MED ORDER — VANCOMYCIN VARIABLE DOSE PER UNSTABLE RENAL FUNCTION (PHARMACIST DOSING): Status: DC

## 2018-08-20 MED ORDER — PANTOPRAZOLE SODIUM 40 MG PO TBEC
40.0000 mg | DELAYED_RELEASE_TABLET | Freq: Two times a day (BID) | ORAL | Status: DC
  Administered 2018-08-20 – 2018-08-22 (×5): 40 mg via ORAL
  Filled 2018-08-20 (×5): qty 1

## 2018-08-20 MED ORDER — SODIUM CHLORIDE 0.9 % IV BOLUS
500.0000 mL | Freq: Once | INTRAVENOUS | Status: AC
  Administered 2018-08-20: 500 mL via INTRAVENOUS

## 2018-08-20 MED ORDER — SODIUM CHLORIDE 0.9 % IV BOLUS
500.0000 mL | Freq: Once | INTRAVENOUS | Status: AC
  Administered 2018-08-21: 500 mL via INTRAVENOUS

## 2018-08-20 MED ORDER — INSULIN ASPART 100 UNIT/ML IV SOLN
10.0000 [IU] | Freq: Once | INTRAVENOUS | Status: DC
  Administered 2018-08-20: 10 [IU] via INTRAVENOUS
  Filled 2018-08-20: qty 0.1

## 2018-08-20 MED ORDER — ONDANSETRON HCL 4 MG PO TABS: 4.0000 mg | ORAL_TABLET | Freq: Four times a day (QID) | ORAL | Status: DC | PRN

## 2018-08-20 MED ORDER — METOLAZONE 5 MG PO TABS
5.0000 mg | ORAL_TABLET | ORAL | Status: DC
  Filled 2018-08-20: qty 1

## 2018-08-20 MED ORDER — VANCOMYCIN HCL IN DEXTROSE 1-5 GM/200ML-% IV SOLN
1000.0000 mg | Freq: Once | INTRAVENOUS | Status: AC
  Administered 2018-08-20: 1000 mg via INTRAVENOUS
  Filled 2018-08-20: qty 200

## 2018-08-20 MED ORDER — LEVOTHYROXINE SODIUM 25 MCG PO TABS
125.0000 ug | ORAL_TABLET | Freq: Every day | ORAL | Status: DC
  Administered 2018-08-21 – 2018-08-25 (×5): 125 ug via ORAL
  Filled 2018-08-20 (×6): qty 1

## 2018-08-20 MED ORDER — APIXABAN 5 MG PO TABS: 5.0000 mg | ORAL_TABLET | Freq: Two times a day (BID) | ORAL | Status: DC

## 2018-08-20 MED ORDER — TORSEMIDE 20 MG PO TABS
80.0000 mg | ORAL_TABLET | Freq: Every evening | ORAL | Status: DC
  Filled 2018-08-20: qty 4

## 2018-08-20 MED ORDER — SODIUM BICARBONATE 650 MG PO TABS
650.0000 mg | ORAL_TABLET | Freq: Two times a day (BID) | ORAL | Status: DC
  Administered 2018-08-20 – 2018-08-25 (×10): 650 mg via ORAL
  Filled 2018-08-20 (×11): qty 1

## 2018-08-20 MED ORDER — SODIUM POLYSTYRENE SULFONATE 15 GM/60ML PO SUSP
45.0000 g | Freq: Once | ORAL | Status: AC
  Administered 2018-08-20: 45 g via ORAL
  Filled 2018-08-20: qty 180

## 2018-08-20 MED ORDER — ALLOPURINOL 100 MG PO TABS
300.0000 mg | ORAL_TABLET | Freq: Every day | ORAL | Status: DC
  Administered 2018-08-20 – 2018-08-25 (×5): 300 mg via ORAL
  Filled 2018-08-20 (×6): qty 3

## 2018-08-20 MED ORDER — PIPERACILLIN-TAZOBACTAM 3.375 G IVPB
3.3750 g | Freq: Three times a day (TID) | INTRAVENOUS | Status: DC
  Administered 2018-08-20 – 2018-08-26 (×17): 3.375 g via INTRAVENOUS
  Filled 2018-08-20 (×17): qty 50

## 2018-08-20 MED ORDER — ONDANSETRON HCL 4 MG/2ML IJ SOLN
4.0000 mg | Freq: Four times a day (QID) | INTRAMUSCULAR | Status: DC | PRN
  Administered 2018-08-20 – 2018-08-23 (×2): 4 mg via INTRAVENOUS
  Filled 2018-08-20 (×2): qty 2

## 2018-08-20 NOTE — Telephone Encounter (Signed)
Pt called wanting his lab results from 11/22. Pt states that his is having a hard time walking and his mobility is declining. I advised pt to go to ED if he feels the need due to the clinic being closed for Thanksgiving.

## 2018-08-20 NOTE — Progress Notes (Addendum)
CRITICAL VALUE ALERT  Critical Value: Potassium 6.4  Date & Time Notied:  08/20/2018 @ 2024  Provider Notified: [facility] On Call  Orders Received/Actions taken: awaiting orders at this time

## 2018-08-20 NOTE — Telephone Encounter (Signed)
It appears the patient is in the ER at this time; blood work has been ordered but has not resulted at this time; will this lab work suffice or do you want me to continue to try to reach the patient-I have tried to contact the patient -no answer as of 08/20/18 at 12:45pm;

## 2018-08-20 NOTE — ED Notes (Signed)
Hospitalist at bedside 

## 2018-08-20 NOTE — ED Notes (Addendum)
Date and time results received: 08/20/18 2:19 PM   Test: Potassium Critical Value: 7.0  Name of Provider Notified: [place]   Orders Received? Or Actions Taken?: awaiting further orders

## 2018-08-20 NOTE — ED Notes (Signed)
ED TO INPATIENT HANDOFF REPORT  Name/Age/Gender Cody Schwartz 62 y.o. male  Code Status Code Status History    Date Active Date Inactive Code Status Order ID Comments User Context   01/10/2018 1418 01/10/2018 2019 Full Code [id number]  Bensimhon, Shaune Pascal, MD Inpatient      Home/SNF/Other Home  Chief Complaint Weakness  Level of Care/Admitting Diagnosis ED Disposition    ED Disposition Condition [place]: [facility] [[id number]]  Level of Care: Stepdown [14]  Admit to SDU based on following criteria: Severe physiological/psychological symptoms:  Any diagnosis requiring assessment & intervention at least every 4 hours on an ongoing basis to obtain desired patient outcomes including stability and rehabilitation  Diagnosis: Sepsis [facility]) [[id number]]  Admitting Physician: Desiree Hane [[id number]]  Attending Physician: Desiree Hane [phone]  Estimated length of stay: past midnight tomorrow  Certification:: I certify this patient will need inpatient services for at least 2 midnights  PT Class (Do Not Modify): Inpatient [101]  PT Acc Code (Do Not Modify): Private [1]       Medical History Past Medical History:  Diagnosis Date  . Congestive heart failure (CHF) ([place])   . Hyperlipidemia   . Hypokalemia     Allergies Allergies  Allergen Reactions  . Spironolactone Itching    IV Location/Drains/Wounds Patient Lines/Drains/Airways Status   Active Line/Drains/Airways    Name:   Placement date:   Placement time:   Site:   Days:   Peripheral IV 08/20/18 Left Antecubital   08/20/18    1255    Antecubital   less than 1   Peripheral IV 08/20/18 Right Forearm   08/20/18    1352    Forearm   less than 1          Labs/Imaging Results for orders placed or performed during the hospital encounter of 08/20/18 (from the past 48 hour(s))  Basic metabolic panel     Status: Abnormal   Collection Time: 08/20/18 12:50 PM  Result Value Ref  Range   Sodium 126 (L) 135 - 145 mmol/L   Potassium 7.0 (HH) 3.5 - 5.1 mmol/L    Comment: NO VISIBLE HEMOLYSIS CRITICAL RESULT CALLED TO, READ BACK BY AND VERIFIED WITH: S.WEST AT 1419 ON 08/20/18 BY N.THOMPSON    Chloride 96 (L) 98 - 111 mmol/L   CO2 15 (L) 22 - 32 mmol/L   Glucose, Bld 111 (H) 70 - 99 mg/dL   BUN 94 (H) 8 - 23 mg/dL   Creatinine, Ser 3.37 (H) 0.61 - 1.24 mg/dL   Calcium 9.2 8.9 - 10.3 mg/dL   GFR calc non Af Amer 18 (L) >60 mL/min   GFR calc Af Amer 21 (L) >60 mL/min   Anion gap 15 5 - 15    Comment: Performed at [facility], [place] [address], [place], [place] [address]  CBC     Status: Abnormal   Collection Time: 08/20/18 12:50 PM  Result Value Ref Range   WBC 20.8 (H) 4.0 - 10.5 K/uL   RBC 3.53 (L) 4.22 - 5.81 MIL/uL   Hemoglobin 11.2 (L) 13.0 - 17.0 g/dL   HCT 34.4 (L) 39.0 - 52.0 %   MCV 97.5 80.0 - 100.0 fL   MCH 31.7 26.0 - 34.0 pg   MCHC 32.6 30.0 - 36.0 g/dL   RDW 15.2 11.5 - 15.5 %   Platelets 215 150 - 400 K/uL   nRBC 0.2 0.0 - 0.2 %  Comment: Performed at [facility], [place] [address], [place], [place] [address]  Differential     Status: Abnormal   Collection Time: 08/20/18 12:50 PM  Result Value Ref Range   Neutrophils Relative % 94 %   Neutro Abs 19.5 (H) 1.7 - 7.7 K/uL   Lymphocytes Relative 1 %   Lymphs Abs 0.3 (L) 0.7 - 4.0 K/uL   Monocytes Relative 3 %   Monocytes Absolute 0.7 0.1 - 1.0 K/uL   Eosinophils Relative 0 %   Eosinophils Absolute 0.0 0.0 - 0.5 K/uL   Basophils Relative 0 %   Basophils Absolute 0.0 0.0 - 0.1 K/uL    Comment: Performed at [facility], [place] [address], [place], [place] [address]  Hepatic function panel     Status: Abnormal   Collection Time: 08/20/18  1:03 PM  Result Value Ref Range   Total Protein 6.8 6.5 - 8.1 g/dL   Albumin 3.1 (L) 3.5 - 5.0 g/dL   AST 58 (H) 15 - 41 U/L   ALT 27 0 - 44 U/L   Alkaline Phosphatase 132 (H) 38 - 126 U/L   Total  Bilirubin 1.9 (H) 0.3 - 1.2 mg/dL   Bilirubin, Direct 1.0 (H) 0.0 - 0.2 mg/dL   Indirect Bilirubin 0.9 0.3 - 0.9 mg/dL    Comment: Performed at [facility], [place] [address] [place], [place] [address]  Brain natriuretic peptide     Status: Abnormal   Collection Time: 08/20/18  1:03 PM  Result Value Ref Range   B Natriuretic Peptide 1,090.4 (H) 0.0 - 100.0 pg/mL    Comment: Performed at [facility], [place] [address] [place],  [address]  CBG monitoring, ED     Status: Abnormal   Collection Time: 08/20/18  1:11 PM  Result Value Ref Range   Glucose-Capillary 107 (H) 70 - 99 mg/dL  I-stat troponin, ED     Status: None   Collection Time: 08/20/18  1:49 PM  Result Value Ref Range   Troponin i, poc 0.02 0.00 - 0.08 ng/mL   Comment 3            Comment: Due to the release kinetics of cTnI, a negative result within the first hours of the onset of symptoms does not rule out myocardial infarction with certainty. If myocardial infarction is still suspected, repeat the test at appropriate intervals.   I-Stat CG4 Lactic Acid, ED     Status: Abnormal   Collection Time: 08/20/18  1:52 PM  Result Value Ref Range   Lactic Acid, Venous 2.13 (HH) 0.5 - 1.9 mmol/L   Comment NOTIFIED PHYSICIAN    Dg Chest Port 1 View  Result Date: 08/20/2018 CLINICAL DATA:  Weakness. Personal history of congestive heart failure. EXAM: PORTABLE CHEST 1 VIEW COMPARISON:  Two-view chest x-ray 08/02/2015 FINDINGS: Heart is enlarged. There is no edema or effusion. No focal airspace disease is present. There is some straightening of the left edge of the cardiac shadow. IMPRESSION: 1. Stable cardiomegaly without failure. 2. Straightening of the left edge of the cardiac shadow. This can be seen in the setting of a pericardial effusion. Patient has had similar appearance before without pericardial effusion. Electronically Signed   By: San Morelle M.D.   On: 08/20/2018 13:58     Pending Labs Unresulted Labs (From admission, onward)    Start     Ordered   08/20/18 1431  Osmolality, urine  ONCE - STAT,  STAT     08/20/18 1430   08/20/18 1324  Blood Culture (routine x 2)  BLOOD CULTURE X 2,   STAT     08/20/18 1323   08/20/18 1236  Urinalysis, Routine w reflex microscopic  Once,   STAT     08/20/18 1235          Vitals/Pain Today's Vitals   08/20/18 1232 08/20/18 1233 08/20/18 1351 08/20/18 1357  BP: 92/63     Pulse: (!) 121     Resp: 16     Temp:    (!) 96.2 F (35.7 C)  TempSrc:    Rectal  SpO2: 94%     Weight:   78.6 kg   Height:   5\' 8"  (1.727 m)   PainSc:  2       Isolation Precautions No active isolations  Medications Medications  vancomycin (VANCOCIN) IVPB 1000 mg/200 mL premix (has no administration in time range)  sodium bicarbonate injection 50 mEq (has no administration in time range)  insulin aspart (novoLOG) injection 10 Units (has no administration in time range)    And  dextrose 50 % solution 50 mL (has no administration in time range)  sodium chloride 0.9 % bolus 1,000 mL (1,000 mLs Intravenous New Bag/Given 08/20/18 1359)  piperacillin-tazobactam (ZOSYN) IVPB 3.375 g (3.375 g Intravenous New Bag/Given 08/20/18 1400)    Mobility walks

## 2018-08-20 NOTE — Progress Notes (Signed)
CRITICAL VALUE ALERT  Critical Value:  Lactic Acid 2.0  Date & Time Notied:  08/20/2018 @ 2350  Provider Notified: [facility] On Call  Orders Received/Actions taken: Cont with lab draws q2hrs

## 2018-08-20 NOTE — Consult Note (Addendum)
Renal Service Consult Note [facility]  Cody Schwartz 08/20/2018 [name] Requesting Physician:  Dr Lonny Prude  Reason for Consult:  AKI on CKD, ^K+ HPI: The patient is a 62 y.o. year-old w hx of CHF EF 15%,  HL, CKD presented to ED w/ gen'd weakness, hx CHF. Seen in ED w/ possible sepsis, BP's 80's,  ^wBC, given IVF's and IV antibiotics.  Not eating or drinking much.  Labs returned w/ creat 3.3 (baseline 2.0) and high K+ 7.0.  EKG showed afib w/ low voltage, QRS 112 msec, no old tracing. Asked to see for AKI/ CKD.    Patient's renal fxn has worsened over the last year, baseline creat this year is around 1.8- 2.4, eGFR 26- 35.    Patient has not been eating well for about 2 weeks. No serious N/V/D.  Leg swelling is not as sig as it usually is.  He takes 40 mQq KCl tid as a liquid per the cardiologist to "keep my K+ up".  He has been taking his daily meds up until the last couple of days hasn't taken them due to nausea.    Denies any cough, SOB, orthopnea, CP, no abd pain. Has not been voiding as much as usual.      Date  Creat   eGFR  2018  1.26  60  feb 2019 2.19 January 2018 1.44  May   1.39  52  June  1.8- 2.1 31- 38  Aug   2.32  28  Sept 2019 2.1- 2.4 26-24 Jul 2018 1.8- 2.0 33  Nov 22 2.29  37  Nov 27 3.37  18     EChart:     ROS  denies CP  no joint pain   no HA  no blurry vision  no rash  no diarrhea  no nausea/ vomiting  no dysuria  no difficulty voiding  no change in urine color    Past Medical History  Past Medical History:  Diagnosis Date  . Congestive heart failure (CHF) ([place])   . Hyperlipidemia   . Hypokalemia    Past Surgical History  Past Surgical History:  Procedure Laterality Date  . BIOPSY  08/05/2018   Procedure: BIOPSY;  Surgeon: Lavena Bullion, DO;  Location: [facility];  Service: Gastroenterology;;  . ESOPHAGOGASTRODUODENOSCOPY (EGD) WITH PROPOFOL N/A 08/05/2018   Procedure: ESOPHAGOGASTRODUODENOSCOPY  (EGD) WITH PROPOFOL;  Surgeon: Lavena Bullion, DO;  Location: [facility];  Service: Gastroenterology;  Laterality: N/A;  . liver biopsy  07/22/2018  . RIGHT/LEFT HEART CATH AND CORONARY ANGIOGRAPHY N/A 01/10/2018   Procedure: RIGHT/LEFT HEART CATH AND CORONARY ANGIOGRAPHY;  Surgeon: Jolaine Artist, MD;  Location: [place] [facility];  Service: Cardiovascular;  Laterality: N/A;   Family History  Family History  Problem Relation Age of Onset  . CAD Paternal Grandmother   . Stroke Paternal Grandfather   . CAD Paternal Grandfather   . Colon cancer Neg Hx    Social History  reports that he has never smoked. He has never used smokeless tobacco. He reports that he does not drink alcohol or use drugs. Allergies  Allergies  Allergen Reactions  . Spironolactone Itching   Home medications Prior to Admission medications   Medication Sig Start Date End Date Taking? Authorizing Provider  allopurinol (ZYLOPRIM) 300 MG tablet Take 300 mg by mouth daily. Take with 100 mg to equal 400 mg daily 03/09/17  Yes [provider]  apixaban (ELIQUIS) 5 MG TABS  tablet Take 1 tablet (5 mg total) by mouth 2 (two) times daily. 01/02/18  Yes Bensimhon, Shaune Pascal, MD  cephALEXin (KEFLEX) 250 MG capsule Take 1 capsule (250 mg total) by mouth 4 (four) times daily. Take for five days 07/30/18  Yes Willia Craze, NP  eplerenone (INSPRA) 25 MG tablet Take 1 tablet (25 mg total) by mouth 2 (two) times daily. 06/11/18  Yes Bensimhon, Shaune Pascal, MD  levothyroxine (SYNTHROID, LEVOTHROID) 125 MCG tablet Take 125 mcg by mouth daily before breakfast.  11/05/17  Yes [provider]  metolazone (ZAROXOLYN) 5 MG tablet Take 1 tablet (5 mg total) by mouth 3 (three) times a week. Every Monday, Wed and Friday Patient taking differently: Take 5 mg by mouth every Monday, Wednesday, and Friday.  03/19/18 08/20/18 Yes Bensimhon, Shaune Pascal, MD  midodrine (PROAMATINE) 10 MG tablet Take 1 tablet (10 mg total) by mouth 3  (three) times daily with meals. Patient taking differently: Take 10 mg by mouth 2 (two) times daily.  06/11/18  Yes Bensimhon, Shaune Pascal, MD  pantoprazole (PROTONIX) 40 MG tablet Take 1 tablet (40 mg total) by mouth 2 (two) times daily. 08/06/18 10/01/18 Yes Cirigliano, Vito V, DO  Potassium Chloride 40 MEQ/15ML (20%) SOLN Take 22.5 mLs by mouth 3 (three) times daily AND 15 mLs 3 (three) times a week. MON, WED, FRI. Patient taking differently: Take 15 ml 3 times daily 04/24/18  Yes Larey Dresser, MD  pravastatin (PRAVACHOL) 40 MG tablet Take 1 tablet (40 mg total) by mouth daily. 03/29/17  Yes [name], MD  sodium bicarbonate 650 MG tablet Take 650 mg by mouth 2 (two) times daily. 08/01/18  Yes [provider]  torsemide (DEMADEX) 20 MG tablet Take 5 tabs in AM and 4 tabs in PM Patient taking differently: Take 80 mg by mouth every evening.  06/11/18  Yes Bensimhon, Shaune Pascal, MD  Vitamin D, Ergocalciferol, (DRISDOL) 50000 units CAPS capsule Take 50,000 Units by mouth every Monday.    Yes [provider]  nitroGLYCERIN (NITROSTAT) 0.4 MG SL tablet Place 1 tablet (0.4 mg total) under the tongue every 5 (five) minutes as needed for chest pain. 03/29/17   [name], MD   Liver Function Tests Recent Labs  Lab 08/15/18 1041 08/20/18 1303  AST 20 58*  ALT 14 27  ALKPHOS 145* 132*  BILITOT 1.5* 1.9*  PROT 6.3 6.8  ALBUMIN 3.4* 3.1*   No results for input(s): LIPASE, AMYLASE in the last 168 hours. CBC Recent Labs  Lab 08/20/18 1250  WBC 20.8*  NEUTROABS 19.5*  HGB 11.2*  HCT 34.4*  MCV 97.5  PLT 481   Basic Metabolic Panel Recent Labs  Lab 08/15/18 1041 08/20/18 1250  NA 125* 126*  K 5.7* 7.0*  CL 91* 96*  CO2 20 15*  GLUCOSE 140* 111*  BUN 74* 94*  CREATININE 2.29* 3.37*  CALCIUM 8.9 9.2   Iron/TIBC/Ferritin/ %Sat    Component Value Date/Time   IRON 47 (L) 08/15/2018 1041   TIBC 234 (L) 08/15/2018 1041   FERRITIN 543 (H) 08/15/2018 1041    IRONPCTSAT 20 08/15/2018 1041    Vitals:   08/20/18 1232 08/20/18 1351 08/20/18 1357  BP: 92/63    Pulse: (!) 121    Resp: 16    Temp:   (!) 96.2 F (35.7 C)  TempSrc:   Rectal  SpO2: 94%    Weight:  78.6 kg   Height:  '5\' 8"'  (1.727 m)  Exam Gen chronically ill appearing, alert, dry mouth No rash, cyanosis or gangrene Sclera anicteric, throat clear No jvd or bruits Chest clear bilat to bases, no rales or wheezing RRR no MRG Abd protuberant, nontender, 3+ ascites, +BS GU normal male MS no joint effusions or deformity Ext 2+ chronic pitting dependent thigh edema, no wounds or ulcers Neuro is alert, Ox 3 , nf, no asterixis    Home meds:  - apixaban 5 bid/  pravastatin 40 qd  - epleronone 25 bid/ metolazone 5 mg mwf/ KCL 15 ml tid (40 mqd/ 60m)/ torsemide 80 hs  - pantoprazole 40 bid/ levothyroxine 125 ug/ allopurinol 300 qd/ vitamins/ prns  - midodrine 10 bid/ sl ntg prn   CXR - no edema, CM  ECHO 5/19 - LVEF 15%, diffuse hypoK, mod MR, mod decreased RV fxn, biatrial enlargement  Impression/ Plan: 1. AKI on CKD 3 - due to relative vol depletion most likely from not eating and taking usual meds/ diuretics, also hypotension.  Possible cardiorenal underlying issues.  Seen by [facility] Dr WJustin Mend2 wks ago.  Poor dialysis candidate given severe comorbidities.  Medical Rx recommended. Hold all diuretics, give gentle IVF"s as NS. Get UA , renal [place] urine lytes.  Will follow.   2. Hyperkalemia - agree w/ acute IV measures already given (IV insulin, glu, Ca++). Will give one dose kayexalate and start Lokelma 10gm tid 3. Cirrhosis - w/ ascites, periodic paracentesis every 5 wks per pt 4. Severe CM EF 15% 5. Hyponatremia - Na 126 6. Atrial fib, chronic - per cardiology 7. Chronic hypotension - cont midodrine 8. Amyloid cardiomyopathy 9. CAD  10. Prognosis - multi-organ failure, prognosis very poor overall.  Discussed EOL issues w/ pt and his wife including DNR.      RKelly Splinter MD CNewell Rubbermaidpager [phone]  08/20/2018, 4:38 PM

## 2018-08-20 NOTE — Progress Notes (Signed)
CRITICAL VALUE ALERT  Critical Value:  Lactic Acid 2.0  Date & Time Notied:  08/20/18 1733   Provider Notified: Oretha Milch, MD   Orders Received/Actions taken: MD paged

## 2018-08-20 NOTE — Consult Note (Signed)
Cardiology Consultation:   Patient ID: Cody Schwartz MRN: [id number]; DOB: 1956-09-08  Admit date: 08/20/2018 Date of Consult: 08/20/2018  Primary Care Provider: [name]., MD Primary Cardiologist:  [place]  Primary Electrophysiologist:  None    Patient Profile:   Cody Schwartz is a 61 y.o. male with a hx of biventricular congestive heart failure due to ischemic cardiomyopathy, coronary artery disease, chronic kidney disease, cirrhosis with ascites requiring frequent paracenteses, moderate pulmonary hypertension who is being seen today for the evaluation of worsening congestive heart failure in the setting of sepsis at the request of  Dr. Lonny Prude. [name]  History of Present Illness:   Cody Schwartz was established with the congestive heart failure clinic.  He has been taking torsemide, eplerenone, and metolazone ( M/W/F) .  He is also required Midodrin  to keep his pressure up.  He had a PYP scan in April, 2019 which was c/w amyloidosis. [name]  He was started on Tafamadis at that time   He presents today with several days of weakness.  These episodes of weakness started about 3 days ago.  He has been using his cane to ambulate around the house.  He had poor p.o. intake recently.  His Eplerenone was doubled on Oct. 18, 1287  Basic metabolic profile today reveals acute worsening of his creatinine.  His potassium is 7.0.  Repeat potassium will has been ordered  He's really not significantly more short of breath and he usually is. He has required ascites and requires paracentesis every 4 to 5 weeks. Chronic cirrhosis.  Past Medical History:  Diagnosis Date  . Congestive heart failure (CHF) ([place])   . Hyperlipidemia   . Hypokalemia     Past Surgical History:  Procedure Laterality Date  . BIOPSY  08/05/2018   Procedure: BIOPSY;  Surgeon: Lavena Bullion, DO;  Location: [facility];  Service: Gastroenterology;;  . ESOPHAGOGASTRODUODENOSCOPY (EGD) WITH  PROPOFOL N/A 08/05/2018   Procedure: ESOPHAGOGASTRODUODENOSCOPY (EGD) WITH PROPOFOL;  Surgeon: Lavena Bullion, DO;  Location: [facility];  Service: Gastroenterology;  Laterality: N/A;  . liver biopsy  07/22/2018  . RIGHT/LEFT HEART CATH AND CORONARY ANGIOGRAPHY N/A 01/10/2018   Procedure: RIGHT/LEFT HEART CATH AND CORONARY ANGIOGRAPHY;  Surgeon: Jolaine Artist, MD;  Location: [facility];  Service: Cardiovascular;  Laterality: N/A;     Home Medications:  Prior to Admission medications   Medication Sig Start Date End Date Taking? Authorizing Provider  allopurinol (ZYLOPRIM) 300 MG tablet Take 300 mg by mouth daily. Take with 100 mg to equal 400 mg daily 03/09/17  Yes [provider]  apixaban (ELIQUIS) 5 MG TABS tablet Take 1 tablet (5 mg total) by mouth 2 (two) times daily. 01/02/18  Yes Bensimhon, Shaune Pascal, MD  cephALEXin (KEFLEX) 250 MG capsule Take 1 capsule (250 mg total) by mouth 4 (four) times daily. Take for five days 07/30/18  Yes Willia Craze, NP  eplerenone (INSPRA) 25 MG tablet Take 1 tablet (25 mg total) by mouth 2 (two) times daily. 06/11/18  Yes Bensimhon, Shaune Pascal, MD  levothyroxine (SYNTHROID, LEVOTHROID) 125 MCG tablet Take 125 mcg by mouth daily before breakfast.  11/05/17  Yes [provider]  metolazone (ZAROXOLYN) 5 MG tablet Take 1 tablet (5 mg total) by mouth 3 (three) times a week. Every Monday, Wed and Friday Patient taking differently: Take 5 mg by mouth every Monday, Wednesday, and Friday.  03/19/18 08/20/18 Yes Bensimhon, Shaune Pascal, MD  midodrine (PROAMATINE) 10  MG tablet Take 1 tablet (10 mg total) by mouth 3 (three) times daily with meals. Patient taking differently: Take 10 mg by mouth 2 (two) times daily.  06/11/18  Yes Bensimhon, Shaune Pascal, MD  pantoprazole (PROTONIX) 40 MG tablet Take 1 tablet (40 mg total) by mouth 2 (two) times daily. 08/06/18 10/01/18 Yes Cirigliano, Vito V, DO  Potassium Chloride 40 MEQ/15ML (20%) SOLN Take 22.5  mLs by mouth 3 (three) times daily AND 15 mLs 3 (three) times a week. MON, WED, FRI. Patient taking differently: Take 15 ml 3 times daily 04/24/18  Yes Larey Dresser, MD  pravastatin (PRAVACHOL) 40 MG tablet Take 1 tablet (40 mg total) by mouth daily. 03/29/17  Yes [name], MD  sodium bicarbonate 650 MG tablet Take 650 mg by mouth 2 (two) times daily. 08/01/18  Yes [provider]  torsemide (DEMADEX) 20 MG tablet Take 5 tabs in AM and 4 tabs in PM Patient taking differently: Take 80 mg by mouth every evening.  06/11/18  Yes Bensimhon, Shaune Pascal, MD  Vitamin D, Ergocalciferol, (DRISDOL) 50000 units CAPS capsule Take 50,000 Units by mouth every Monday.    Yes [provider]  nitroGLYCERIN (NITROSTAT) 0.4 MG SL tablet Place 1 tablet (0.4 mg total) under the tongue every 5 (five) minutes as needed for chest pain. 03/29/17   [name], MD    Inpatient Medications: Scheduled Meds: . allopurinol  300 mg Oral Daily  . apixaban  5 mg Oral BID  . [START ON 08/21/2018] levothyroxine  125 mcg Oral Q0600  . metolazone  5 mg Oral Q M,W,F  . midodrine  10 mg Oral BID  . pantoprazole  40 mg Oral BID  . pravastatin  40 mg Oral q1800  . sodium bicarbonate  50 mEq Intravenous Once  . sodium bicarbonate  650 mg Oral BID  . torsemide  80 mg Oral QPM   Continuous Infusions: . vancomycin 1,000 mg (08/20/18 1522)   PRN Meds: nitroGLYCERIN, ondansetron **OR** ondansetron (ZOFRAN) IV, senna-docusate, traMADol  Allergies:    Allergies  Allergen Reactions  . Spironolactone Itching    Social History:   Social History   Socioeconomic History  . Marital status: Married    Spouse name: Not on file  . Number of children: Not on file  . Years of education: Not on file  . Highest education level: Not on file  Occupational History  . Not on file  Social Needs  . Financial resource strain: Not on file  . Food insecurity:    Worry: Not on file    Inability: Not on file  .  Transportation needs:    Medical: Not on file    Non-medical: Not on file  Tobacco Use  . Smoking status: Never Smoker  . Smokeless tobacco: Never Used  Substance and Sexual Activity  . Alcohol use: No    Frequency: Never  . Drug use: Never  . Sexual activity: Not on file  Lifestyle  . Physical activity:    Days per week: Not on file    Minutes per session: Not on file  . Stress: Not on file  Relationships  . Social connections:    Talks on phone: Not on file    Gets together: Not on file    Attends religious service: Not on file    Active member of club or organization: Not on file    Attends meetings of clubs or organizations: Not on file  Relationship status: Not on file  . Intimate partner violence:    Fear of current or ex partner: Not on file    Emotionally abused: Not on file    Physically abused: Not on file    Forced sexual activity: Not on file  Other Topics Concern  . Not on file  Social History Narrative  . Not on file    Family History:    Family History  Problem Relation Age of Onset  . CAD Paternal Grandmother   . Stroke Paternal Grandfather   . CAD Paternal Grandfather   . Colon cancer Neg Hx      ROS:  Please see the history of present illness.   All other ROS reviewed and negative.     Physical Exam/Data:   Vitals:   08/20/18 1232 08/20/18 1351 08/20/18 1357  BP: 92/63    Pulse: (!) 121    Resp: 16    Temp:   (!) 96.2 F (35.7 C)  TempSrc:   Rectal  SpO2: 94%    Weight:  78.6 kg   Height:  5\' 8"  (1.727 m)     Intake/Output Summary (Last 24 hours) at 08/20/2018 1621 Last data filed at 08/20/2018 1518 Gross per 24 hour  Intake 1050 ml  Output -  Net 1050 ml   Filed Weights   08/20/18 1351  Weight: 78.6 kg   Body mass index is 26.33 kg/m.  General: Chronically ill-appearing Schwartz, no acute distress HEENT: normal Lymph: no adenopathy Neck: Markedly elevated JVD. Endocrine:  No thryomegaly Vascular: No carotid  bruits; FA pulses 2+ bilaterally without bruits  Cardiac: 1 S2.  Positive S3 gallop Lungs: Really clear anteriorly. Abd: Woman is distended and consistent with ascites Ext: His legs have 1-2+ pitting edema.  He has multiple excoriations and ulcerations on his legs.  His legs are erythematous.  Several of the ulcerations have a foul odor. Musculoskeletal:  No deformities, he has extreme muscle weakness skin: warm and dry  Neuro:  CNs 2-12 intact, no focal abnormalities noted Psych:  Normal affect   EKG:  The EKG was personally reviewed and demonstrates:   Atrial fib.   QRS duration  112 msec.  Telemetry:  Telemetry was personally reviewed and demonstrates:   Sinus tach   Relevant CV Studies:    Laboratory Data:  Chemistry Recent Labs  Lab 08/15/18 1041 08/20/18 1250  NA 125* 126*  K 5.7* 7.0*  CL 91* 96*  CO2 20 15*  GLUCOSE 140* 111*  BUN 74* 94*  CREATININE 2.29* 3.37*  CALCIUM 8.9 9.2  GFRNONAA  --  18*  GFRAA  --  21*  ANIONGAP  --  15    Recent Labs  Lab 08/15/18 1041 08/20/18 1303  PROT 6.3 6.8  ALBUMIN 3.4* 3.1*  AST 20 58*  ALT 14 27  ALKPHOS 145* 132*  BILITOT 1.5* 1.9*   Hematology Recent Labs  Lab 08/20/18 1250  WBC 20.8*  RBC 3.53*  HGB 11.2*  HCT 34.4*  MCV 97.5  MCH 31.7  MCHC 32.6  RDW 15.2  PLT 215   Cardiac EnzymesNo results for input(s): TROPONINI in the last 168 hours.  Recent Labs  Lab 08/20/18 1349  TROPIPOC 0.02    BNP Recent Labs  Lab 08/20/18 1303  BNP 1,090.4*    DDimer No results for input(s): DDIMER in the last 168 hours.  Radiology/Studies:  Dg Chest Port 1 View  Result Date: 08/20/2018 CLINICAL DATA:  Weakness. Personal history of  congestive heart failure. EXAM: PORTABLE CHEST 1 VIEW COMPARISON:  Two-view chest x-ray 08/02/2015 FINDINGS: Heart is enlarged. There is no edema or effusion. No focal airspace disease is present. There is some straightening of the left edge of the cardiac shadow. IMPRESSION: 1. Stable  cardiomegaly without failure. 2. Straightening of the left edge of the cardiac shadow. This can be seen in the setting of a pericardial effusion. Patient has had similar appearance before without pericardial effusion. Electronically Signed   By: San Morelle M.D.   On: 08/20/2018 13:58    Assessment and Plan:   1. Profound muscle weakness: He has initial basic metabolic profile revealed potassium level of 7.0.  If this is accurate, this would certainly explain his muscle weakness.  Repeat potassium level is pending.  States this elevated he will receive insulin, glucose,  Nephrology has been consulted.   Will hold Eplerenone   2.  Chronic combined systolic and diastolic congestive heart failure: He has signs of CHF but really is about at baseline.    3.  Sepsis syndrome:   Has multiple leg ulcerations.   Foul smelling .   Needs culturing,,   Blood cultures. , possible ID consult .    Following    For questions or updates, please contact [place] Please consult [email or web address] for contact info under     Signed, Mertie Moores, MD  08/20/2018 4:21 PM

## 2018-08-20 NOTE — ED Notes (Signed)
Patient attempted to use urinal to provide urine specimen, unsuccessful

## 2018-08-20 NOTE — Telephone Encounter (Signed)
Called pt and he states that he has not had any repeat labs and the last labs were on 11/22. Pt also states that he is coming to ED at [facility] due to feeling really bad.

## 2018-08-20 NOTE — Progress Notes (Addendum)
Pharmacy Antibiotic Note  Cody Schwartz is a 62 y.o. male admitted on 08/20/2018 with SIRS and AKI, hyperkalemia.  Pharmacy has been consulted for Zosyn and Vancomycin dosing.  Temp 96.2 WBC 20.8 SCr 3.37, CKD SCr 2.29 08/15/18 Lactate 2.13  Plan: Zosyn 3.375gm IV q8h (4hr extended infusions) Monitor renal function closely and adjust if CrCl falls < 20 ml/min Vancomycin 1g IV x 1 given in ED - f/u SCr in AM to determine further maintenance dosing  Height: 5\' 8"  (172.7 cm) Weight: 173 lb 3.2 oz (78.6 kg) IBW/kg (Calculated) : 68.4  Temp (24hrs), Avg:96.2 F (35.7 C), Min:96.2 F (35.7 C), Max:96.2 F (35.7 C)  Recent Labs  Lab 08/15/18 1041 08/20/18 1250 08/20/18 1352  WBC  --  20.8*  --   CREATININE 2.29* 3.37*  --   LATICACIDVEN  --   --  2.13*    Estimated Creatinine Clearance: 21.4 mL/min (A) (by C-G formula based on SCr of 3.37 mg/dL (H)).    Allergies  Allergen Reactions  . Spironolactone Itching    Antimicrobials this admission:  11/27 Vanc >> 11/27 Zosyn >>  Dose adjustments this admission:    Microbiology results:  11/27 BCx: 11/27 MRSA PCR:  Thank you for allowing pharmacy to be a part of this patient's care.  Peggyann Juba, PharmD, BCPS Pager: [phone] 08/20/2018 4:42 PM

## 2018-08-20 NOTE — Telephone Encounter (Signed)
Tried calling to follow-up on labs. Plan for repeat BMP in heparinized tube given elevated K+. We left a VM earlier in the week, and I do not see that he has called back. Unsure if this was done at a lab that I cannot view in Epic or just not yet completed.   Lesly Rubenstein, can you continue to try to contact the patient and/or his wife today? Thank you.

## 2018-08-20 NOTE — Progress Notes (Signed)
[place] Progress Note Patient Name: Cody Schwartz DOB: 01-12-1956 MRN: [id number]   Date of Service  08/20/2018  HPI/Events of Note  Notified of hypotension and suggestion by cardiology to start  norepinephrine. On further question patient with poor PO intake the past 4 days and continued to take his diuretics.  Lactate improved after a 500 cc bolus. Urine output was dark according to wife, now with 50 cc amber colored urine.  eICU Interventions  Will give another 500cc NS bolus and hopefully patient will not require pressors.     Intervention Category Major Interventions: Hypotension - evaluation and management Intermediate Interventions: Hypovolemia - evaluation and management  Judd Lien 08/20/2018, 11:55 PM

## 2018-08-20 NOTE — Progress Notes (Signed)
CRITICAL VALUE ALERT  Critical Value:  Potassium 6.9   Date & Time Notied:  08/20/18 1726  Provider Notified: Oretha Milch, MD   Orders Received/Actions taken: MD paged

## 2018-08-20 NOTE — ED Triage Notes (Signed)
Pt states he feels weak, especially in legs. Pt has hx of heart failure. Pt called cardiologist and was told to come to ED.

## 2018-08-20 NOTE — H&P (Signed)
History and Physical  Cody Schwartz:[id number] DOB: 1956-08-25 DOA: 08/20/2018   PCP: [name]., MD  Patient coming from:Home  Chief Complaint: I feel weak  HPI: [name] is a 62 y.o. male with medical history significant for biventricular CHF (EF 15% on TTE 01/2017, severe right ventricular dysfunction) secondary to ischemic cardia myopathy, CAD status post PCI with DES (09/2015), CKD, moderate pulmonary hypertension, cirrhosis/congestive hepatopathywho presents on 08/20/2018 with several days of weakness.    Weakness started three days ago. He reports the strength in his legs diminishing slowly.  Typically he uses his cane to ambulate around the house; he now needs help even getting out of the bed where normally he could do that on his own.   He has been having problems with his potassium.  He saw the report that it was high from his outpatient visit with his GI doctor, Dr. Bryan Schwartz on 11/22 (potassium at that time was 5.7).  The last few days reports diminished water intake with loss of appetite.  Denies any changes to his HF medications. He has been taking OTC ibuprofen about 6 tabs 200 mg a daily for a few months for hip and back pain.  He stopped taking that after seeing his nephrologist (Dr. Justin Schwartz) earlier this month  Every 4-5 weeks gets paracentesis for his ascites. Its been about 2 weeks since his last one (08/08/18) where they took off 10 liters per patient and wife   ED Course: In the ED he was afebrile, respiratory rate 23, normal oxygen saturation, heart rate range 97-1 05, blood pressure range 72/45-106/82.  Lactic acid 2.13, Blood cultures x2 were obtained, troponin 0 0.02 Hepatic function panel significant for alk phos 132, total bili 1.9, direct bilirubin 1 BNP 1090.4, potassium 126, K7, CO2 15, BUN 94, creatinine 3.37. WBC 20.8, hemoglobin 11  Chest x-ray shows stable cardiomegaly, straightening of left edge of cardiac shadow, patient has had similar  appearance without pericardial effusion. Patient was given IV vancomycin, IV Zosyn and 1 L normal saline fluid  [facility] hospitalist was called for admission  Review of Systems:As mentioned in the history of present illness.Review of systems are otherwise negative Patient seen in the ED.   Past Medical History:  Diagnosis Date  . Congestive heart failure (CHF) ([place])   . Hyperlipidemia   . Hypokalemia    Past Surgical History:  Procedure Laterality Date  . BIOPSY  08/05/2018   Procedure: BIOPSY;  Surgeon: Cody Bullion, DO;  Location: [facility];  Service: Gastroenterology;;  . ESOPHAGOGASTRODUODENOSCOPY (EGD) WITH PROPOFOL N/A 08/05/2018   Procedure: ESOPHAGOGASTRODUODENOSCOPY (EGD) WITH PROPOFOL;  Surgeon: Cody Bullion, DO;  Location: [facility];  Service: Gastroenterology;  Laterality: N/A;  . liver biopsy  07/22/2018  . RIGHT/LEFT HEART CATH AND CORONARY ANGIOGRAPHY N/A 01/10/2018   Procedure: RIGHT/LEFT HEART CATH AND CORONARY ANGIOGRAPHY;  Surgeon: Cody Artist, MD;  Location: [facility];  Service: Cardiovascular;  Laterality: N/A;   Allergies  Allergen Reactions  . Spironolactone Itching   Social History:  reports that he has never smoked. He has never used smokeless tobacco. He reports that he does not drink alcohol or use drugs. Family History  Problem Relation Age of Onset  . CAD Paternal Grandmother   . Stroke Paternal Grandfather   . CAD Paternal Grandfather   . Colon cancer Neg Hx       Prior to Admission medications   Medication Sig Start Date End Date Taking? Authorizing Provider  allopurinol (ZYLOPRIM) 300 MG tablet Take 300 mg by mouth daily. Take with 100 mg to equal 400 mg daily 03/09/17  Yes [provider]  apixaban (ELIQUIS) 5 MG TABS tablet Take 1 tablet (5 mg total) by mouth 2 (two) times daily. 01/02/18  Yes Bensimhon, Cody Pascal, MD  cephALEXin (KEFLEX) 250 MG capsule Take 1 capsule (250 mg total) by mouth 4 (four)  times daily. Take for five days 07/30/18  Yes Willia Craze, NP  eplerenone (INSPRA) 25 MG tablet Take 1 tablet (25 mg total) by mouth 2 (two) times daily. 06/11/18  Yes Bensimhon, Cody Pascal, MD  levothyroxine (SYNTHROID, LEVOTHROID) 125 MCG tablet Take 125 mcg by mouth daily before breakfast.  11/05/17  Yes [provider]  metolazone (ZAROXOLYN) 5 MG tablet Take 1 tablet (5 mg total) by mouth 3 (three) times a week. Every Monday, Wed and Friday Patient taking differently: Take 5 mg by mouth every Monday, Wednesday, and Friday.  03/19/18 08/20/18 Yes Bensimhon, Cody Pascal, MD  midodrine (PROAMATINE) 10 MG tablet Take 1 tablet (10 mg total) by mouth 3 (three) times daily with meals. Patient taking differently: Take 10 mg by mouth 2 (two) times daily.  06/11/18  Yes Bensimhon, Cody Pascal, MD  pantoprazole (PROTONIX) 40 MG tablet Take 1 tablet (40 mg total) by mouth 2 (two) times daily. 08/06/18 10/01/18 Yes Schwartz, Cody V, DO  Potassium Chloride 40 MEQ/15ML (20%) SOLN Take 22.5 mLs by mouth 3 (three) times daily AND 15 mLs 3 (three) times a week. MON, WED, FRI. Patient taking differently: Take 15 ml 3 times daily 04/24/18  Yes Cody Dresser, MD  pravastatin (PRAVACHOL) 40 MG tablet Take 1 tablet (40 mg total) by mouth daily. 03/29/17  Yes Cody Priest, MD  sodium bicarbonate 650 MG tablet Take 650 mg by mouth 2 (two) times daily. 08/01/18  Yes [provider]  torsemide (DEMADEX) 20 MG tablet Take 5 tabs in AM and 4 tabs in PM Patient taking differently: Take 80 mg by mouth every evening.  06/11/18  Yes Bensimhon, Cody Pascal, MD  Vitamin D, Ergocalciferol, (DRISDOL) 50000 units CAPS capsule Take 50,000 Units by mouth every Monday.    Yes [provider]  nitroGLYCERIN (NITROSTAT) 0.4 MG SL tablet Place 1 tablet (0.4 mg total) under the tongue every 5 (five) minutes as needed for chest pain. 03/29/17   Cody Priest, MD    Physical Exam: BP 92/63   Pulse (!) 121   Temp (!)  96.2 F (35.7 C) (Rectal)   Resp 16   Ht '5\' 8"'  (1.727 m)   Wt 78.6 kg   SpO2 94%   BMI 26.33 kg/m   Constitutional jaundiced male, chronically ill-appearing, no acute distress Eyes: Bilateral scleral icterus, EOMI ENMT: Dry oral mucosa Cardiovascular: Tachycardic, no appreciable murmurs, scant edema in bilateral lower extremities, Respiratory: Normal respiratory effort on room air, clear breath sounds  Abdomen: Soft, distended, nontender, diminished bowel sounds Skin: Weeping a superficial skin abrasions on both legs, old ulcerations the size of a coin on posterior leg, circular ulcers with yellow slough and surrounding erythema on right lower ankle, right shin and upper thigh, also on left leg Neurologic: Slow in speech, no dysarthria or slurred speech otherwise no acute focal neurologic deficit Psychiatric:Appropriate affect, and mood. Mental status AAOx3          Labs on Admission:  Basic Metabolic Panel: Recent Labs  Lab 08/15/18 1041 08/20/18 1250  NA 125* 126*  K 5.7* 7.0*  CL 91* 96*  CO2 20 15*  GLUCOSE 140* 111*  BUN 74* 94*  CREATININE 2.29* 3.37*  CALCIUM 8.9 9.2   Liver Function Tests: Recent Labs  Lab 08/15/18 1041 08/20/18 1303  AST 20 58*  ALT 14 27  ALKPHOS 145* 132*  BILITOT 1.5* 1.9*  PROT 6.3 6.8  ALBUMIN 3.4* 3.1*   No results for input(s): LIPASE, AMYLASE in the last 168 hours. No results for input(s): AMMONIA in the last 168 hours. CBC: Recent Labs  Lab 08/20/18 1250  WBC 20.8*  NEUTROABS 19.5*  HGB 11.2*  HCT 34.4*  MCV 97.5  PLT 215   Cardiac Enzymes: No results for input(s): CKTOTAL, CKMB, CKMBINDEX, TROPONINI in the last 168 hours.  BNP (last 3 results) Recent Labs    08/20/18 1303  BNP 1,090.4*    ProBNP (last 3 results) Recent Labs    10/28/17 1155  PROBNP 15,013*    CBG: Recent Labs  Lab 08/20/18 1311  GLUCAP 107*    Radiological Exams on Admission: Dg Chest Port 1 View  Result Date:  08/20/2018 CLINICAL DATA:  Weakness. Personal history of congestive heart failure. EXAM: PORTABLE CHEST 1 VIEW COMPARISON:  Two-view chest x-ray 08/02/2015 FINDINGS: Heart is enlarged. There is no edema or effusion. No focal airspace disease is present. There is some straightening of the left edge of the cardiac shadow. IMPRESSION: 1. Stable cardiomegaly without failure. 2. Straightening of the left edge of the cardiac shadow. This can be seen in the setting of a pericardial effusion. Patient has had similar appearance before without pericardial effusion. Electronically Signed   By: San Morelle M.D.   On: 08/20/2018 13:58    EKG: Independently reviewed.Atrial fibrillation. Maybe some slight t wave tenting in V waves Assessment/Plan Present on Admission: . Sepsis ([place]) . Hyperkalemia, diminished renal excretion . CKD (chronic kidney disease) . Chronic systolic heart failure ([place]) . CAD (coronary artery disease), native coronary artery . Dilated cardiomyopathy ([place]) . Hypotension . Right heart failure (secondary to left heart failure) ([place]) . Ascites  Active Problems:   CAD (coronary artery disease), native coronary artery   Dilated cardiomyopathy ([facility])   Chronic systolic heart failure ([facility])   CKD (chronic kidney disease)   Ascites   Right heart failure (secondary to left heart failure) ([facility])   Hypotension   Sepsis ([facility])   Hyperkalemia, diminished renal excretion   AKI on CKD with hyperkalemia Has multiple risk factors for acute worsening of kidney function Decreased effective arterial volume in the setting of CHF exacerbation or worsening ascites from cirrhosis but actually doesn't seem volume overloaded on exam from a CHF standpoint Has ascites on exam Reports chronic NSAID use (6 tabs daily) disease, states discontinued that after recent outpatient consultation by Dr. Justin Schwartz Additionally takes eplerenone(apparently dose was recently doubled) and oral potassium which could be  contributing. -Consult nephrology and cardiology, appreciate recs -We will hold eplerenone and home oral potassium -Status post 1 L normal saline and calcium gluconate in ED -insulin, D50, bicarb, repeat K - renal recs starting lokelma 10g TID, and kayexylate x1 -Timed IVF, hold home diuretics - Obtain UA, renal lytes, renal ultrasound, strict I's and O's  Sepsis, unclear etiology Suspect may be related to infection of subacute/acute bilateral leg wounds Hypotension is more chronic and at his baseline  SBP is on differential though without pain and asymptomatic No clear etiology for his significant leukocytosis otherwise S/p IV vancomycin and zosyn in ED -monitor blood cultures -  IV zosyn for coverage for possible SBP in addition to potential anaerobic organisms (for biliary involvement) -IV vancomycin for GPC/MRSA -diagnostic/therapeutic paracentesis -discussed with ID who will see patient on 08/21/18  Cirrhosis, decompensated with ascites Biopsy-proven congestive hepatopathy Followed by Dr. Bryan Schwartz, GI He does not want to proceed with referral to transplant hepatology per GI outpatient note Undergoes monthly large-volume therapeutic paracenteses Radiology has suggested indwelling [place] for home management in the past, patient was considering Seems somewhat slow in speech but no overt signs of hepatic encephalopathy - Consult INR for paracentesis while here, obtain diagnostic analysis - Low-sodium diet -Check ammonia, INR  Chronic biventricular heart failure Mixed nonischemic and ischemic cardiomyopathy per outpatient notes Last TTE 5/19 EF 15-20%, severe biventricular failure BNP elevated at 1000, not much fluid in lower extremities, Chest x-ray shows stable cardiomegaly with no effusion or edema Actually peripheral edema at his best per patient  -Hold home metolazone and torsemide given not signficantly volume overload and more like intravascularly depleted -Hold  eplerenone in setting of hyperkalemia -Continue Midorin -Intolerant to beta-blockers, per outpatient notes -Consult cardiology/advanced heart failure  Chronic hypotension Likely multifactorial from cirrhosis/CHF Typically runs 80s over 60s as an outpatient, currently 90s over 50s this seems consistent with his baseline Status post normal saline bolus 1 L in the ED -Give timed ivF -Continue Midrin -Lactic acid trend  Bilateral leg wounds Mix of chronic and subacute/acute Coin sized lesions some with superfical abrasions, others with yellow drainage and surrounding erythema Definite concern for nidus of infection -follow blood cultures -empiric vancomycin and zosyn  Erosive esophagitis with GERD Based on recent EGD -Continue PPI twice daily  Recent diagnosis of Amyloid cardiomyopathy PYP scan (12/2017) borderline values concerning for transthyretin amyloidosis -started on tafamadis by outpatient cardiology  CAD Asymptomatic  Chronic AF Rate controlled -hold eliquis in setting of AKI and GFR,30 -subcut heparin ppx for now    DVT prophylaxis: Heparin Subcut  Code Status: Full COde    Family Communication: Wife at bedside   Consults called: Cardiology, nephrology , ID  Admission status: Admitted as inpatient to stepdown unit, expect greater than 9mdnight stay due to complexities of managing his AKI on CKD, hyperkalemia, Biventricular heart failure and cirrhosis with ascites.       SDesiree HaneMD [facility]  Pager [phone] If 7PM-7AM, please contact night-coverage [email or web address] Password [email or web address] 08/20/2018, 4:23 PM

## 2018-08-20 NOTE — Telephone Encounter (Signed)
Can you call him back and see if he had repeat labs after 11/22? I know he had labs on 11/22 and his creatinine was stable, but potassium was 5.7 and his GI doctor requested repeat labs. If he got these, can you find out where? We do not have any labs faxed in, but since we did not order it, it may have been faxed somewhere else. It looks like GI doctor has been trying to get a hold of him as well to follow up on repeat labs. As far as weakness goes, we could refer him for [facility] PT.

## 2018-08-20 NOTE — Progress Notes (Signed)
CRITICAL VALUE ALERT  Critical Value:  Lactic Acid 2.5  Date & Time Notied:  08/20/2018 @ 1937  Provider Notified: [facility] On Call  Orders Received/Actions taken: Awaiting Orders at this time

## 2018-08-20 NOTE — ED Provider Notes (Addendum)
[facility] DEPT Provider Note   CSN: [id number] Arrival date & time: 08/20/18  1212     History   Chief Complaint Chief Complaint  Patient presents with  . Weakness    HPI Cody Schwartz is a 62 y.o. male.  Patient states that he feels very weak.  Has not been eating or drinking as much either.  The history is provided by the patient. No language interpreter was used.  Weakness  This is a new problem. The current episode started more than 2 days ago. The problem has not changed since onset.There was no focality noted. There has been no fever. Pertinent negatives include no shortness of breath, no chest pain and no headaches. There were no medications administered prior to arrival. Associated medical issues do not include trauma.    Past Medical History:  Diagnosis Date  . Congestive heart failure (CHF) ([place])   . Hyperlipidemia   . Hypokalemia     Patient Active Problem List   Diagnosis Date Noted  . Sepsis ([place]) 08/20/2018  . Cirrhosis of liver with ascites ([place])   . Gastritis and gastroduodenitis   . Gastric ulcer without hemorrhage or perforation   . Gastroesophageal reflux disease with esophagitis   . Hypotension 02/05/2018  . CKD (chronic kidney disease) 12/08/2017  . Ascites 12/08/2017  . Right heart failure (secondary to left heart failure) ([place]) 12/08/2017  . CAD (coronary artery disease), native coronary artery 03/27/2017  . Diabetes mellitus ([employer]) 03/27/2017  . Dilated cardiomyopathy ([place]) 03/27/2017  . Hyperlipidemia 03/27/2017  . Chronic systolic heart failure ([place]) 03/27/2017  . Ischemic cardiomyopathy 03/27/2017  . PVC (premature ventricular contraction) 07/17/2015    Past Surgical History:  Procedure Laterality Date  . BIOPSY  08/05/2018   Procedure: BIOPSY;  Surgeon: Lavena Bullion, DO;  Location: [facility];  Service: Gastroenterology;;  . ESOPHAGOGASTRODUODENOSCOPY (EGD) WITH PROPOFOL N/A 08/05/2018   Procedure: ESOPHAGOGASTRODUODENOSCOPY (EGD) WITH PROPOFOL;  Surgeon: Lavena Bullion, DO;  Location: [facility];  Service: Gastroenterology;  Laterality: N/A;  . liver biopsy  07/22/2018  . RIGHT/LEFT HEART CATH AND CORONARY ANGIOGRAPHY N/A 01/10/2018   Procedure: RIGHT/LEFT HEART CATH AND CORONARY ANGIOGRAPHY;  Surgeon: Jolaine Artist, MD;  Location: [facility];  Service: Cardiovascular;  Laterality: N/A;        Home Medications    Prior to Admission medications   Medication Sig Start Date End Date Taking? Authorizing Provider  allopurinol (ZYLOPRIM) 300 MG tablet Take 300 mg by mouth daily. Take with 100 mg to equal 400 mg daily 03/09/17  Yes [provider]  apixaban (ELIQUIS) 5 MG TABS tablet Take 1 tablet (5 mg total) by mouth 2 (two) times daily. 01/02/18  Yes Bensimhon, Shaune Pascal, MD  cephALEXin (KEFLEX) 250 MG capsule Take 1 capsule (250 mg total) by mouth 4 (four) times daily. Take for five days 07/30/18  Yes Willia Craze, NP  eplerenone (INSPRA) 25 MG tablet Take 1 tablet (25 mg total) by mouth 2 (two) times daily. 06/11/18  Yes Bensimhon, Shaune Pascal, MD  levothyroxine (SYNTHROID, LEVOTHROID) 125 MCG tablet Take 125 mcg by mouth daily before breakfast.  11/05/17  Yes [provider]  metolazone (ZAROXOLYN) 5 MG tablet Take 1 tablet (5 mg total) by mouth 3 (three) times a week. Every Monday, Wed and Friday Patient taking differently: Take 5 mg by mouth every Monday, Wednesday, and Friday.  03/19/18 08/20/18 Yes Bensimhon, Shaune Pascal, MD  midodrine (PROAMATINE) 10 MG tablet  Take 1 tablet (10 mg total) by mouth 3 (three) times daily with meals. Patient taking differently: Take 10 mg by mouth 2 (two) times daily.  06/11/18  Yes Bensimhon, Shaune Pascal, MD  pantoprazole (PROTONIX) 40 MG tablet Take 1 tablet (40 mg total) by mouth 2 (two) times daily. 08/06/18 10/01/18 Yes Cirigliano, Vito V, DO  Potassium Chloride 40 MEQ/15ML (20%) SOLN Take 22.5 mLs by mouth 3  (three) times daily AND 15 mLs 3 (three) times a week. MON, WED, FRI. Patient taking differently: Take 15 ml 3 times daily 04/24/18  Yes Larey Dresser, MD  pravastatin (PRAVACHOL) 40 MG tablet Take 1 tablet (40 mg total) by mouth daily. 03/29/17  Yes [name], MD  sodium bicarbonate 650 MG tablet Take 650 mg by mouth 2 (two) times daily. 08/01/18  Yes [provider]  torsemide (DEMADEX) 20 MG tablet Take 5 tabs in AM and 4 tabs in PM Patient taking differently: Take 80 mg by mouth every evening.  06/11/18  Yes Bensimhon, Shaune Pascal, MD  Vitamin D, Ergocalciferol, (DRISDOL) 50000 units CAPS capsule Take 50,000 Units by mouth every Monday.    Yes [provider]  nitroGLYCERIN (NITROSTAT) 0.4 MG SL tablet Place 1 tablet (0.4 mg total) under the tongue every 5 (five) minutes as needed for chest pain. 03/29/17   [name], MD    Family History Family History  Problem Relation Age of Onset  . CAD Paternal Grandmother   . Stroke Paternal Grandfather   . CAD Paternal Grandfather   . Colon cancer Neg Hx     Social History Social History   Tobacco Use  . Smoking status: Never Smoker  . Smokeless tobacco: Never Used  Substance Use Topics  . Alcohol use: No    Frequency: Never  . Drug use: Never     Allergies   Spironolactone   Review of Systems Review of Systems  Constitutional: Negative for appetite change and fatigue.  HENT: Negative for congestion, ear discharge and sinus pressure.   Eyes: Negative for discharge.  Respiratory: Negative for cough and shortness of breath.   Cardiovascular: Negative for chest pain.  Gastrointestinal: Negative for abdominal pain and diarrhea.  Genitourinary: Negative for frequency and hematuria.  Musculoskeletal: Negative for back pain.  Skin: Negative for rash.  Neurological: Positive for weakness. Negative for seizures and headaches.  Psychiatric/Behavioral: Negative for hallucinations.     Physical Exam Updated  Vital Signs BP 92/63   Pulse (!) 121   Temp (!) 96.2 F (35.7 C) (Rectal)   Resp 16   Ht 5\' 8"  (1.727 m)   Wt 78.6 kg   SpO2 94%   BMI 26.33 kg/m   Physical Exam  Constitutional: He is oriented to person, place, and time. He appears well-developed.  HENT:  Head: Normocephalic.  Dry mucous membranes  Eyes: Conjunctivae and EOM are normal. No scleral icterus.  Neck: Neck supple. No thyromegaly present.  Cardiovascular: Normal rate and regular rhythm. Exam reveals no gallop and no friction rub.  No murmur heard. Pulmonary/Chest: No stridor. He has no wheezes. He has no rales. He exhibits no tenderness.  Abdominal: He exhibits distension. There is no tenderness. There is no rebound.  Musculoskeletal: Normal range of motion. He exhibits no edema.  Lymphadenopathy:    He has no cervical adenopathy.  Neurological: He is oriented to person, place, and time. He exhibits normal muscle tone. Coordination normal.  Skin: No rash noted. No erythema.  Psychiatric: He  has a normal mood and affect. His behavior is normal.     ED Treatments / Results  Labs (all labs ordered are listed, but only abnormal results are displayed) Labs Reviewed  BASIC METABOLIC PANEL - Abnormal; Notable for the following components:      Result Value   Sodium 126 (*)    Potassium 7.0 (*)    Chloride 96 (*)    CO2 15 (*)    Glucose, Bld 111 (*)    BUN 94 (*)    Creatinine, Ser 3.37 (*)    GFR calc non Af Amer 18 (*)    GFR calc Af Amer 21 (*)    All other components within normal limits  CBC - Abnormal; Notable for the following components:   WBC 20.8 (*)    RBC 3.53 (*)    Hemoglobin 11.2 (*)    HCT 34.4 (*)    All other components within normal limits  HEPATIC FUNCTION PANEL - Abnormal; Notable for the following components:   Albumin 3.1 (*)    AST 58 (*)    Alkaline Phosphatase 132 (*)    Total Bilirubin 1.9 (*)    Bilirubin, Direct 1.0 (*)    All other components within normal limits  BRAIN  NATRIURETIC PEPTIDE - Abnormal; Notable for the following components:   B Natriuretic Peptide 1,090.4 (*)    All other components within normal limits  DIFFERENTIAL - Abnormal; Notable for the following components:   Neutro Abs 19.5 (*)    Lymphs Abs 0.3 (*)    All other components within normal limits  CBG MONITORING, ED - Abnormal; Notable for the following components:   Glucose-Capillary 107 (*)    All other components within normal limits  I-STAT CG4 LACTIC ACID, ED - Abnormal; Notable for the following components:   Lactic Acid, Venous 2.13 (*)    All other components within normal limits  CULTURE, BLOOD (ROUTINE X 2)  CULTURE, BLOOD (ROUTINE X 2)  URINALYSIS, ROUTINE W REFLEX MICROSCOPIC  OSMOLALITY, URINE  I-STAT TROPONIN, ED    EKG None  Radiology Dg Chest Port 1 View  Result Date: 08/20/2018 CLINICAL DATA:  Weakness. Personal history of congestive heart failure. EXAM: PORTABLE CHEST 1 VIEW COMPARISON:  Two-view chest x-ray 08/02/2015 FINDINGS: Heart is enlarged. There is no edema or effusion. No focal airspace disease is present. There is some straightening of the left edge of the cardiac shadow. IMPRESSION: 1. Stable cardiomegaly without failure. 2. Straightening of the left edge of the cardiac shadow. This can be seen in the setting of a pericardial effusion. Patient has had similar appearance before without pericardial effusion. Electronically Signed   By: San Morelle M.D.   On: 08/20/2018 13:58    Procedures Procedures (including critical care time)  Medications Ordered in ED Medications  sodium chloride 0.9 % bolus 1,000 mL (1,000 mLs Intravenous New Bag/Given 08/20/18 1359)  vancomycin (VANCOCIN) IVPB 1000 mg/200 mL premix (has no administration in time range)  sodium bicarbonate injection 50 mEq (has no administration in time range)  insulin aspart (novoLOG) injection 10 Units (has no administration in time range)    And  dextrose 50 % solution 50 mL  (has no administration in time range)  piperacillin-tazobactam (ZOSYN) IVPB 3.375 g (3.375 g Intravenous New Bag/Given 08/20/18 1400)     Initial Impression / Assessment and Plan / ED Course  I have reviewed the triage vital signs and the nursing notes.  Pertinent labs & imaging results that  were available during my care of the patient were reviewed by me and considered in my medical decision making (see chart for details).      EKG Interpretation  Date/Time:    Ventricular Rate:    PR Interval:    QRS Duration:   QT Interval:    QTC Calculation:   R Axis:     Text Interpretation:          CRITICAL CARE Performed by: Milton Ferguson Total critical care time: 40 minutes Critical care time was exclusive of separately billable procedures and treating other patients. Critical care was necessary to treat or prevent imminent or life-threatening deterioration. Critical care was time spent personally by me on the following activities: development of treatment plan with patient and/or surrogate as well as nursing, discussions with consultants, evaluation of patient's response to treatment, examination of patient, obtaining history from patient or surrogate, ordering and performing treatments and interventions, ordering and review of laboratory studies, ordering and review of radiographic studies, pulse oximetry and re-evaluation of patient's condition.  Patient with possible sepsis.  Patient has significant leukocytosis.  He was given fluids and antibiotics he also has hyperkalemia.  Patient's hyperkalemia was treated with insulin glucose and bicarb.  He will be admitted to medicine Final Clinical Impressions(s) / ED Diagnoses   Final diagnoses:  Weakness    ED Discharge Orders    None       Milton Ferguson, MD 08/20/18 1451    Milton Ferguson, MD 08/30/18 1055

## 2018-08-21 ENCOUNTER — Inpatient Hospital Stay (HOSPITAL_COMMUNITY): Payer: BLUE CROSS/BLUE SHIELD

## 2018-08-21 ENCOUNTER — Encounter (HOSPITAL_COMMUNITY): Payer: Self-pay | Admitting: Pulmonary Disease

## 2018-08-21 DIAGNOSIS — K652 Spontaneous bacterial peritonitis: Secondary | ICD-10-CM

## 2018-08-21 DIAGNOSIS — L97921 Non-pressure chronic ulcer of unspecified part of left lower leg limited to breakdown of skin: Secondary | ICD-10-CM

## 2018-08-21 DIAGNOSIS — I482 Chronic atrial fibrillation, unspecified: Secondary | ICD-10-CM

## 2018-08-21 DIAGNOSIS — N189 Chronic kidney disease, unspecified: Secondary | ICD-10-CM

## 2018-08-21 DIAGNOSIS — E871 Hypo-osmolality and hyponatremia: Secondary | ICD-10-CM

## 2018-08-21 DIAGNOSIS — I420 Dilated cardiomyopathy: Secondary | ICD-10-CM

## 2018-08-21 DIAGNOSIS — A419 Sepsis, unspecified organism: Principal | ICD-10-CM

## 2018-08-21 DIAGNOSIS — I2510 Atherosclerotic heart disease of native coronary artery without angina pectoris: Secondary | ICD-10-CM

## 2018-08-21 DIAGNOSIS — E875 Hyperkalemia: Secondary | ICD-10-CM

## 2018-08-21 DIAGNOSIS — L97919 Non-pressure chronic ulcer of unspecified part of right lower leg with unspecified severity: Secondary | ICD-10-CM

## 2018-08-21 DIAGNOSIS — R188 Other ascites: Secondary | ICD-10-CM

## 2018-08-21 LAB — COMPREHENSIVE METABOLIC PANEL
ALBUMIN: 2.8 g/dL — AB (ref 3.5–5.0)
ALT: 27 U/L (ref 0–44)
AST: 43 U/L — AB (ref 15–41)
Alkaline Phosphatase: 113 U/L (ref 38–126)
Anion gap: 12 (ref 5–15)
BILIRUBIN TOTAL: 2.1 mg/dL — AB (ref 0.3–1.2)
BUN: 87 mg/dL — ABNORMAL HIGH (ref 8–23)
CO2: 15 mmol/L — AB (ref 22–32)
Calcium: 8.8 mg/dL — ABNORMAL LOW (ref 8.9–10.3)
Chloride: 103 mmol/L (ref 98–111)
Creatinine, Ser: 3.17 mg/dL — ABNORMAL HIGH (ref 0.61–1.24)
GFR calc Af Amer: 23 mL/min — ABNORMAL LOW (ref >60)
GFR calc non Af Amer: 20 mL/min — ABNORMAL LOW (ref >60)
GLUCOSE: 91 mg/dL (ref 70–99)
POTASSIUM: 6.6 mmol/L — AB (ref 3.5–5.1)
Sodium: 130 mmol/L — ABNORMAL LOW (ref 135–145)
TOTAL PROTEIN: 6 g/dL — AB (ref 6.5–8.1)

## 2018-08-21 LAB — BODY FLUID CELL COUNT WITH DIFFERENTIAL
LYMPHS FL: 2 %
Monocyte-Macrophage-Serous Fluid: 6 % — ABNORMAL LOW (ref 50–90)
NEUTROPHIL FLUID: 92 % — AB (ref 0–25)
Total Nucleated Cell Count, Fluid: 8365 cu mm — ABNORMAL HIGH (ref 0–1000)

## 2018-08-21 LAB — GRAM STAIN

## 2018-08-21 LAB — CBC
HCT: 33.7 % — ABNORMAL LOW (ref 39.0–52.0)
HEMOGLOBIN: 10.8 g/dL — AB (ref 13.0–17.0)
MCH: 31.5 pg (ref 26.0–34.0)
MCHC: 32 g/dL (ref 30.0–36.0)
MCV: 98.3 fL (ref 80.0–100.0)
Platelets: 195 10*3/uL (ref 150–400)
RBC: 3.43 MIL/uL — ABNORMAL LOW (ref 4.22–5.81)
RDW: 15.3 % (ref 11.5–15.5)
WBC: 17.8 10*3/uL — ABNORMAL HIGH (ref 4.0–10.5)
nRBC: 0.6 % — ABNORMAL HIGH (ref 0.0–0.2)

## 2018-08-21 LAB — VANCOMYCIN, RANDOM: Vancomycin Rm: 10

## 2018-08-21 LAB — LACTIC ACID, PLASMA: Lactic Acid, Venous: 1.7 mmol/L (ref 0.5–1.9)

## 2018-08-21 LAB — PROTEIN, PLEURAL OR PERITONEAL FLUID: Total protein, fluid: <3 g/dL

## 2018-08-21 MED ORDER — SODIUM POLYSTYRENE SULFONATE 15 GM/60ML PO SUSP
30.0000 g | Freq: Once | ORAL | Status: AC
  Administered 2018-08-21: 30 g via ORAL
  Filled 2018-08-21: qty 120

## 2018-08-21 MED ORDER — STERILE WATER FOR INJECTION IV SOLN
INTRAVENOUS | Status: DC
  Administered 2018-08-21: 17:00:00 via INTRAVENOUS
  Filled 2018-08-21 (×6): qty 850

## 2018-08-21 MED ORDER — NOREPINEPHRINE 4 MG/250ML-% IV SOLN
0.0000 ug/min | INTRAVENOUS | Status: DC
  Filled 2018-08-21: qty 250

## 2018-08-21 MED ORDER — VANCOMYCIN HCL IN DEXTROSE 1-5 GM/200ML-% IV SOLN
1000.0000 mg | Freq: Once | INTRAVENOUS | Status: AC
  Administered 2018-08-21: 1000 mg via INTRAVENOUS
  Filled 2018-08-21: qty 200

## 2018-08-21 MED ORDER — ORAL CARE MOUTH RINSE
15.0000 mL | Freq: Two times a day (BID) | OROMUCOSAL | Status: DC
  Administered 2018-08-21 – 2018-08-25 (×6): 15 mL via OROMUCOSAL

## 2018-08-21 MED ORDER — LIP MEDEX EX OINT
TOPICAL_OINTMENT | CUTANEOUS | Status: AC
  Administered 2018-08-21: 11:00:00
  Filled 2018-08-21: qty 7

## 2018-08-21 MED ORDER — SODIUM CHLORIDE 0.9 % IV SOLN
250.0000 mL | INTRAVENOUS | Status: DC
  Administered 2018-08-21 – 2018-08-23 (×2): 250 mL via INTRAVENOUS

## 2018-08-21 MED ORDER — NOREPINEPHRINE 4 MG/250ML-% IV SOLN
2.0000 ug/min | INTRAVENOUS | Status: DC
  Filled 2018-08-21: qty 250

## 2018-08-21 MED ORDER — LIDOCAINE HCL 1 % IJ SOLN
INTRAMUSCULAR | Status: AC
  Filled 2018-08-21: qty 20

## 2018-08-21 MED ORDER — ALBUMIN HUMAN 25 % IV SOLN
12.5000 g | Freq: Once | INTRAVENOUS | Status: AC
  Administered 2018-08-21: 12.5 g via INTRAVENOUS
  Filled 2018-08-21: qty 50

## 2018-08-21 MED ORDER — MIDODRINE HCL 5 MG PO TABS
10.0000 mg | ORAL_TABLET | Freq: Three times a day (TID) | ORAL | Status: DC
  Administered 2018-08-21 – 2018-08-25 (×10): 10 mg via ORAL
  Filled 2018-08-21 (×12): qty 2

## 2018-08-21 MED ORDER — NOREPINEPHRINE 16 MG/250ML-% IV SOLN
0.0000 ug/min | INTRAVENOUS | Status: DC
  Administered 2018-08-21: 2 ug/min via INTRAVENOUS
  Administered 2018-08-22 – 2018-08-23 (×2): 40 ug/min via INTRAVENOUS
  Administered 2018-08-23: 36 ug/min via INTRAVENOUS
  Administered 2018-08-24: 37 ug/min via INTRAVENOUS
  Administered 2018-08-24: 39 ug/min via INTRAVENOUS
  Administered 2018-08-25: 28 ug/min via INTRAVENOUS
  Administered 2018-08-26: 11 ug/min via INTRAVENOUS
  Filled 2018-08-21 (×13): qty 250

## 2018-08-21 NOTE — Progress Notes (Signed)
R Forearm PIV removed. Skin under dressing sloughed off with dressing removal. Antibiotic ointment and Vaseline guaze with wet to dry dressing applied.

## 2018-08-21 NOTE — Progress Notes (Signed)
Progress Note  Patient Name: Cody Schwartz Date of Encounter: 08/21/2018  Primary Cardiologist: Bensimhon   Subjective   Ext 62-year-old gentleman with end-stage congestive heart failure, liver cirrhosis, acute on chronic renal insufficiency  He was admitted with profound leg weakness.  He was found to have acute renal failure and a potassium of 7.0.  His eplerenone had been recently increased.  He was also taking potassium supplementation along with his metolazone.   He is had a progressive steady decline in health over the past several months. He is awake alert but his memory is not very sharp today.  He lost focus several times while we were talking this morning.  Some level is slightly low at 6.6.   Inpatient Medications    Scheduled Meds: . allopurinol  300 mg Oral Daily  . heparin injection (subcutaneous)  5,000 Units Subcutaneous Q8H  . levothyroxine  125 mcg Oral Q0600  . midodrine  10 mg Oral BID  . pantoprazole  40 mg Oral BID  . pravastatin  40 mg Oral q1800  . sodium bicarbonate  650 mg Oral BID  . sodium zirconium cyclosilicate  10 g Oral TID  . vancomycin variable dose per unstable renal function (pharmacist dosing)   Does not apply See admin instructions   Continuous Infusions: . piperacillin-tazobactam (ZOSYN)  IV 12.5 mL/hr at 08/21/18 0800   PRN Meds: nitroGLYCERIN, ondansetron **OR** ondansetron (ZOFRAN) IV, senna-docusate, traMADol   Vital Signs    Vitals:   08/21/18 0615 08/21/18 0630 08/21/18 0700 08/21/18 0730  BP:  (!) 74/49 (!) 86/62 91/65  Pulse: (!) 130 (!) 151 (!) 137 (!) 144  Resp: (!) 26 (!) 29 16 (!) 22  Temp:      TempSrc:      SpO2: 97% 100% 100% 97%  Weight:      Height:        Intake/Output Summary (Last 24 hours) at 08/21/2018 0809 Last data filed at 08/21/2018 0800 Gross per 24 hour  Intake 3788.84 ml  Output 250 ml  Net 3538.84 ml   Filed Weights   08/20/18 1351  Weight: 78.6 kg    Telemetry    Afib with  RVR - Personally Reviewed  ECG     - Personally Reviewed  Physical Exam   GEN:  Chronically ill-appearing gentleman.  His skin is yellow.  This morning. Neck:  Had a JVD. Cardiac:  Irregularly irregular.  He is tachycardic.  Positive S3 gallop Respiratory: Clear to auscultation bilaterally. GI:  Abdomen is very distended.  Nontender.  No rebound MS:  1-2+ pitting edema.  His legs have been wrapped.  He has multiple excoriations on his legs. Neuro:  Nonfocal  Psych: Normal affect   Labs    Chemistry Recent Labs  Lab 08/15/18 1041 08/20/18 1250 08/20/18 1303 08/20/18 1633 08/20/18 1851 08/21/18 0340  NA 125* 126*  --   --  128* 130*  K 5.7* 7.0*  --  6.9* 6.4* 6.6*  CL 91* 96*  --   --  104 103  CO2 20 15*  --   --  14* 15*  GLUCOSE 140* 111*  --   --  82 91  BUN 74* 94*  --   --  90* 87*  CREATININE 2.29* 3.37*  --   --  3.25* 3.17*  CALCIUM 8.9 9.2  --   --  8.7* 8.8*  PROT 6.3  --  6.8  --   --  6.0*  ALBUMIN  3.4*  --  3.1*  --   --  2.8*  AST 20  --  58*  --   --  43*  ALT 14  --  27  --   --  27  ALKPHOS 145*  --  132*  --   --  113  BILITOT 1.5*  --  1.9*  --   --  2.1*  GFRNONAA  --  18*  --   --  19* 20*  GFRAA  --  21*  --   --  22* 23*  ANIONGAP  --  15  --   --  10 12     Hematology Recent Labs  Lab 08/20/18 1250 08/21/18 0340  WBC 20.8* 17.8*  RBC 3.53* 3.43*  HGB 11.2* 10.8*  HCT 34.4* 33.7*  MCV 97.5 98.3  MCH 31.7 31.5  MCHC 32.6 32.0  RDW 15.2 15.3  PLT 215 195    Cardiac EnzymesNo results for input(s): TROPONINI in the last 168 hours.  Recent Labs  Lab 08/20/18 1349  TROPIPOC 0.02     BNP Recent Labs  Lab 08/20/18 1303  BNP 1,090.4*     DDimer No results for input(s): DDIMER in the last 168 hours.   Radiology    US Renal  Result Date: 08/20/2018 CLINICAL DATA:  62 y/o  M; acute kidney injury. EXAM: RENAL / URINARY TRACT ULTRASOUND COMPLETE COMPARISON:  04/14/2018 right upper quadrant ultrasound. FINDINGS: Right Kidney:  Renal measurements: 10.7 x 4.6 x 4.7 cm = volume: 121 mL . Echogenicity within normal limits. No mass or hydronephrosis visualized. Left Kidney: Renal measurements: 11.2 x 5.0 x 4.0 cm = volume: 117 mL. Echogenicity within normal limits. No mass or hydronephrosis visualized. Bladder: Appears normal for degree of bladder distention. Incidental note of large volume of ascites. IMPRESSION: 1. No acute process identified.  Mild bilateral renal atrophy. 2. Large volume ascites. Electronically Signed   By: Kristine Garbe M.D.   On: 08/20/2018 19:00   Dg Chest Port 1 View  Result Date: 08/20/2018 CLINICAL DATA:  Weakness. Personal history of congestive heart failure. EXAM: PORTABLE CHEST 1 VIEW COMPARISON:  Two-view chest x-ray 08/02/2015 FINDINGS: Heart is enlarged. There is no edema or effusion. No focal airspace disease is present. There is some straightening of the left edge of the cardiac shadow. IMPRESSION: 1. Stable cardiomegaly without failure. 2. Straightening of the left edge of the cardiac shadow. This can be seen in the setting of a pericardial effusion. Patient has had similar appearance before without pericardial effusion. Electronically Signed   By: San Morelle M.D.   On: 08/20/2018 13:58    Cardiac Studies     Patient Profile     62 y.o. male with end-stage congestive heart failure.  He was admitted with sepsis syndrome likely related to his leg wounds.  Assessment & Plan    1.  End-stage biventricular heart failure: Patient has end-stage heart failure.  He is having multiple complications related to this.  He has ascites and gets paracentesis every couple of months. His appetite is poor and he has not eaten well for the past couple days.  He is developed acute on chronic renal insufficiency. Continue supportive care.  I discussed with the wife the fact that we really do not have many other options.  2.  Sepsis syndrome: He has multiple excoriations on his legs that  had a foul odor.  I suspect he may have sepsis syndrome related to that.  He  is on antibiotics.  Continue antibiotics for now.  3.  Hyperkalemia: Plans per internal medicine team and nephrology.  Eplerenone has been stopped.       For questions or updates, please contact [place] Please consult [email or web address] for contact info under        Signed, Mertie Moores, MD  08/21/2018, 8:09 AM

## 2018-08-21 NOTE — Progress Notes (Signed)
Pharmacy Antibiotic Note  Cody Schwartz is a 62 y.o. male admitted on 08/20/2018 with sepsis and AKI, hyperkalemia.  Pharmacy has been consulted for Zosyn and Vancomycin dosing.  Today, 08/21/18:  Vancomycin random level @ 18:43 = 10 mcg/ml  Plan:  Vancomycin 1gm IV x 1 dose tonight  F/U AM SCr and order vancomycin random levels as needed based on renal function  Height: 5\' 8"  (172.7 cm) Weight: 173 lb 3.2 oz (78.6 kg) IBW/kg (Calculated) : 68.4  Temp (24hrs), Avg:97.6 F (36.4 C), Min:97 F (36.1 C), Max:98.4 F (36.9 C)  Recent Labs  Lab 08/15/18 1041 08/20/18 1250 08/20/18 1352 08/20/18 1633 08/20/18 1851 08/20/18 2248 08/21/18 0340 08/21/18 1843  WBC  --  20.8*  --   --   --   --  17.8*  --   CREATININE 2.29* 3.37*  --   --  3.25*  --  3.17*  --   LATICACIDVEN  --   --  2.13* 2.0* 2.5* 2.0* 1.7  --   VANCORANDOM  --   --   --   --   --   --   --  10    Estimated Creatinine Clearance: 22.8 mL/min (A) (by C-G formula based on SCr of 3.17 mg/dL (H)).    Allergies  Allergen Reactions  . Spironolactone Itching    Antimicrobials this admission:  11/27 Vancomycin >> 11/27 Zosyn >>  Microbiology results:  11/27 BCx: NG to date 11/27 MRSA PCR: negative 11/28 Peritoneal fluid: sent  Thank you for allowing pharmacy to be a part of this patient's care.   Leone Haven, PharmD 08/21/2018 @ 19:57

## 2018-08-21 NOTE — Progress Notes (Signed)
Pharmacy Antibiotic Note  Cody Schwartz is a 62 y.o. male admitted on 08/20/2018 with sepsis and AKI, hyperkalemia.  Pharmacy has been consulted for Zosyn and Vancomycin dosing.  Today, 08/21/18:  Tmax 97.8 (Tmin 96.2) WBC 20.8 > 17.8 SCr 3.37 > 3.17, AKI on CKD 3 Lactate 2.13 > 2 > 2.5 > 2 > 1.7  Plan:  Continue Zosyn 3.375g IV q8h (4h extended infusions)-adjust dose if CrCl falls below 20 ml/min.   Check Vancomycin level at 2000 tonight and re-dose if < 20 mcg/mL.  Monitor renal function, cultures, clinical course, and ID recommendations.   Height: 5\' 8"  (172.7 cm) Weight: 173 lb 3.2 oz (78.6 kg) IBW/kg (Calculated) : 68.4  Temp (24hrs), Avg:97.2 F (36.2 C), Min:96.2 F (35.7 C), Max:97.8 F (36.6 C)  Recent Labs  Lab 08/15/18 1041 08/20/18 1250 08/20/18 1352 08/20/18 1633 08/20/18 1851 08/20/18 2248 08/21/18 0340  WBC  --  20.8*  --   --   --   --  17.8*  CREATININE 2.29* 3.37*  --   --  3.25*  --  3.17*  LATICACIDVEN  --   --  2.13* 2.0* 2.5* 2.0* 1.7    Estimated Creatinine Clearance: 22.8 mL/min (A) (by C-G formula based on SCr of 3.17 mg/dL (H)).    Allergies  Allergen Reactions  . Spironolactone Itching    Antimicrobials this admission:  11/27 Vancomycin >> 11/27 Zosyn >>  Microbiology results:  11/27 BCx: sent 11/27 MRSA PCR: negative  Thank you for allowing pharmacy to be a part of this patient's care.   Lindell Spar, PharmD, BCPS Pager: [phone] 08/21/2018 7:44 AM

## 2018-08-21 NOTE — Progress Notes (Signed)
CRITICAL VALUE ALERT  Critical Value: Potassium 6.6  Date & Time Notied:  08/21/2018 @ 1638  Provider Notified: [facility] On Call   Orders Received/Actions taken: Awaiting orders at this time

## 2018-08-21 NOTE — Progress Notes (Signed)
[facility]  PROGRESS NOTE  Cody Schwartz DVV:[id number] DOB: [birth year] DOA: 08/20/2018 PCP: [name]., MD   Brief HPI:   62 y/o male with history of biventricular CHF, EF 15% on transthoracic echo, severe right ventricular dysfunction secondary to ischemic cardiomyopathy, CAD status post PCI with DES 1/17, CKD, moderate pulmonary hypertension, cirrhosis/congestive hepatopathy who presented on 08/20/2018 with complaints of generalized weakness.  Patient was found to have hyperkalemia, also in the ED was hypotensive blood cultures x2 obtained, BNP was significantly elevated.  Cardiology and nephrology were consulted.  Patient was started on vancomycin and Zosyn for sepsis.    Subjective   Patient seen and examined, denies abdominal pain, has been somnolent, plan for paracentesis today.   Assessment/Plan:     1. Sepsis-likely secondary to SBP, WBC count is 8000 on ascitic fluid analysis.  Continue Zosyn and vancomycin.  Will await ascitic fluid culture results.  2. Acute kidney injury on CKD with hyperkalemia-patient presents with worsening creatinine, diuretics on hold.  Nephrology following.  3. Hyperkalemia-patient came with potassium of 7.0, started on Lokelma 10 g 3 times daily.  Today potassium is 6.6, also received Kayexalate.  Will recheck BMP in a.m.  4. Chronic hypotension-patient blood pressure is low, will increase the dose of midodrine to 10 mg 3 times daily.  Also give albumin 12.5 g 25% x 1.  If no improvement will start Levophed infusion and consult CCM.  5. Bilateral leg wounds-patient has bilateral leg lesions started on empiric vancomycin and Zosyn.  6. Recent diagnosis of amyloid cardiomyopathy-confirmed on PYP scan which showed values consistent for transthyretin amyloidosis.  Started on tafamidis by cardiology.  7. Chronic biventricular heart failure-mixed nonischemic and ischemic cardiomyopathy metolazone and torsemide on hold due to hypotension.   Continue midodrine.  Cardiology following.     CBG: Recent Labs  Lab 08/20/18 1311  GLUCAP 107*    CBC: Recent Labs  Lab 08/20/18 1250 08/21/18 0340  WBC 20.8* 17.8*  NEUTROABS 19.5*  --   HGB 11.2* 10.8*  HCT 34.4* 33.7*  MCV 97.5 98.3  PLT 215 626    Basic Metabolic Panel: Recent Labs  Lab 08/15/18 1041 08/20/18 1250 08/20/18 1633 08/20/18 1851 08/21/18 0340  NA 125* 126*  --  128* 130*  K 5.7* 7.0* 6.9* 6.4* 6.6*  CL 91* 96*  --  104 103  CO2 20 15*  --  14* 15*  GLUCOSE 140* 111*  --  82 91  BUN 74* 94*  --  90* 87*  CREATININE 2.29* 3.37*  --  3.25* 3.17*  CALCIUM 8.9 9.2  --  8.7* 8.8*     DVT prophylaxis: Heparin  Code Status: Full code  Family Communication: Discussed with patient's wife at bedside  Disposition Plan: likely home when medically ready for discharge   Consultants:  Cardiology  Nephrology  Procedures:  None    Antibiotics:   Anti-infectives (From admission, onward)   Start     Dose/Rate Route Frequency Ordered Stop   08/20/18 2000  piperacillin-tazobactam (ZOSYN) IVPB 3.375 g     3.375 g 12.5 mL/hr over 240 Minutes Intravenous Every 8 hours 08/20/18 1638     08/20/18 1728  vancomycin variable dose per unstable renal function (pharmacist dosing)      Does not apply See admin instructions 08/20/18 1728     08/20/18 1330  vancomycin (VANCOCIN) IVPB 1000 mg/200 mL premix     1,000 mg 200 mL/hr over 60 Minutes Intravenous  Once 08/20/18  1325 08/20/18 1754   08/20/18 1330  piperacillin-tazobactam (ZOSYN) IVPB 3.375 g     3.375 g 100 mL/hr over 30 Minutes Intravenous  Once 08/20/18 1325 08/20/18 1518       Objective   Vitals:   08/21/18 0950 08/21/18 0955 08/21/18 1000 08/21/18 1100  BP: (!) 81/65 94/68 102/73 92/61  Pulse: (!) 144 (!) 129 (!) 126 (!) 144  Resp: 18 18 18 19   Temp:      TempSrc:      SpO2: 97% 98% 99% 98%  Weight:      Height:        Intake/Output Summary (Last 24 hours) at 08/21/2018  1123 Last data filed at 08/21/2018 1113 Gross per 24 hour  Intake 3788.84 ml  Output 292 ml  Net 3496.84 ml   Filed Weights   08/20/18 1351  Weight: 78.6 kg     Physical Examination:    General: Appears somnolent, but arousable  Cardiovascular: S1-S2, regular  Respiratory: Clear to auscultation bilaterally  Abdomen: Soft, nontender, distended, positive shifting dullness  Extremities: Both lower extremities in dressing  Neurologic: Somnolent but arousable oriented x3, no focal deficit.     Data Reviewed: I have personally reviewed following labs and imaging studies   Recent Results (from the past 240 hour(s))  Blood Culture (routine x 2)     Status: None (Preliminary result)   Collection Time: 08/20/18  1:51 PM  Result Value Ref Range Status   Specimen Description   Final    RIGHT ANTECUBITAL Performed at [facility] [address], [place], [place] [address]    Special Requests   Final    BOTTLES DRAWN AEROBIC AND ANAEROBIC Blood Culture adequate volume Performed at [facility] [address] [place], [place] [address]    Culture   Final    NO GROWTH < 24 HOURS Performed at [facility] [address] [place], [place] [address]    Report Status PENDING  Incomplete  Blood Culture (routine x 2)     Status: None (Preliminary result)   Collection Time: 08/20/18  1:51 PM  Result Value Ref Range Status   Specimen Description   Final    LEFT ANTECUBITAL Performed at [facility] [address] [place], [place] [address]    Special Requests   Final    BOTTLES DRAWN AEROBIC AND ANAEROBIC Blood Culture adequate volume Performed at [facility] [address] [place], [place] [address]    Culture   Final    NO GROWTH < 24 HOURS Performed at [facility] [address], [place],  [address]    Report Status PENDING  Incomplete  MRSA PCR Screening      Status: None   Collection Time: 08/20/18  4:12 PM  Result Value Ref Range Status   MRSA by PCR NEGATIVE NEGATIVE Final    Comment:        The GeneXpert MRSA Assay (FDA approved for NASAL specimens only), is one component of a comprehensive MRSA colonization surveillance program. It is not intended to diagnose MRSA infection nor to guide or monitor treatment for MRSA infections. Performed at [facility], [place] [address] [place],  [address]      Liver Function Tests: Recent Labs  Lab 08/15/18 1041 08/20/18 1303 08/21/18 0340  AST 20 58* 43*  ALT 14 27 27   ALKPHOS 145* 132* 113  BILITOT 1.5* 1.9* 2.1*  PROT 6.3 6.8 6.0*  ALBUMIN 3.4* 3.1* 2.8*   No results for input(s): LIPASE, AMYLASE in the last 168 hours. Recent Labs  Lab 08/20/18 1633  AMMONIA 14    Cardiac Enzymes: No results for input(s): CKTOTAL, CKMB, CKMBINDEX, TROPONINI in the last 168 hours. BNP (last 3 results) Recent Labs    08/20/18 1303  BNP 1,090.4*    ProBNP (last 3 results) Recent Labs    10/28/17 1155  PROBNP 15,013*      Studies: US Renal  Result Date: 08/20/2018 CLINICAL DATA:  62 y/o  M; acute kidney injury. EXAM: RENAL / URINARY TRACT ULTRASOUND COMPLETE COMPARISON:  04/14/2018 right upper quadrant ultrasound. FINDINGS: Right Kidney: Renal measurements: 10.7 x 4.6 x 4.7 cm = volume: 121 mL . Echogenicity within normal limits. No mass or hydronephrosis visualized. Left Kidney: Renal measurements: 11.2 x 5.0 x 4.0 cm = volume: 117 mL. Echogenicity within normal limits. No mass or hydronephrosis visualized. Bladder: Appears normal for degree of bladder distention. Incidental note of large volume of ascites. IMPRESSION: 1. No acute process identified.  Mild bilateral renal atrophy. 2. Large volume ascites. Electronically Signed   By: Kristine Garbe M.D.   On: 08/20/2018 19:00   US Paracentesis  Result Date: 08/21/2018 INDICATION: 62 year old with  recurrent ascites and history of CHF. Patient was recently admitted to the hospital for sepsis and hyperkalemia. Plan for ultrasound-guided paracentesis with a maximum volume of 5 L. EXAM: ULTRASOUND GUIDED PARACENTESIS MEDICATIONS: None. COMPLICATIONS: None immediate. PROCEDURE: Informed written consent was obtained from the patient after a discussion of the risks, benefits and alternatives to treatment. A timeout was performed prior to the initiation of the procedure. Initial ultrasound scanning demonstrates a large amount of ascites within the left lower abdominal quadrant. The left lower abdomen was prepped and draped in the usual sterile fashion. 1% lidocaine with was used for local anesthesia. Following this, a 6 Fr Safe-T-Centesis catheter was introduced. An ultrasound image was saved for documentation purposes. The paracentesis was performed. The catheter was removed and a dressing was applied. The patient tolerated the procedure well without immediate post procedural complication. FINDINGS: A total of approximately 5 L of cloudy yellow fluid was removed. Samples were sent to the laboratory as requested by the clinical team. IMPRESSION: Successful ultrasound-guided paracentesis yielding 5 liters of peritoneal fluid. Electronically Signed   By: Markus Daft M.D.   On: 08/21/2018 10:30   Dg Chest Port 1 View  Result Date: 08/20/2018 CLINICAL DATA:  Weakness. Personal history of congestive heart failure. EXAM: PORTABLE CHEST 1 VIEW COMPARISON:  Two-view chest x-ray 08/02/2015 FINDINGS: Heart is enlarged. There is no edema or effusion. No focal airspace disease is present. There is some straightening of the left edge of the cardiac shadow. IMPRESSION: 1. Stable cardiomegaly without failure. 2. Straightening of the left edge of the cardiac shadow. This can be seen in the setting of a pericardial effusion. Patient has had similar appearance before without pericardial effusion. Electronically Signed   By:  San Morelle M.D.   On: 08/20/2018 13:58    Scheduled Meds: . allopurinol  300 mg Oral Daily  . heparin injection (subcutaneous)  5,000 Units Subcutaneous Q8H  . levothyroxine  125 mcg Oral Q0600  . mouth rinse  15 mL Mouth Rinse BID  . midodrine  10 mg Oral BID  . pantoprazole  40 mg Oral BID  . pravastatin  40 mg Oral q1800  . sodium bicarbonate  650 mg Oral BID  .  sodium zirconium cyclosilicate  10 g Oral TID  . vancomycin variable dose per unstable renal function (pharmacist dosing)   Does not apply See admin instructions    Admission status:Inpatient: Based on patients clinical presentation and evaluation of above clinical data, I have made determination that patient meets Inpatient criteria at this time.  Time spent: 25 min  [facility] Pager [phone]. If 7PM-7AM, please contact night-coverage at [email or web address], Office  [phone]  password [email or web address]  08/21/2018, 11:23 AM  LOS: 1 day

## 2018-08-21 NOTE — Procedures (Signed)
Central Venous Catheter Insertion Procedure Note ANTRON SETH [id number] [birth year]  Procedure: Insertion of Central Venous Catheter Indications: Assessment of intravascular volume and Drug and/or fluid administration  Procedure Details Consent: Risks of procedure as well as the alternatives and risks of each were explained to the (patient/caregiver).  Consent for procedure obtained. Time Out: Verified patient identification, verified procedure, site/side was marked, verified correct patient position, special equipment/implants available, medications/allergies/relevent history reviewed, required imaging and test results available.  Performed  Maximum sterile technique was used including antiseptics, cap, gloves, gown, hand hygiene, mask and sheet. Skin prep: Chlorhexidine; local anesthetic administered A antimicrobial bonded/coated triple lumen catheter was placed in the right internal jugular vein using the Seldinger technique.  Evaluation Blood flow good Complications: No apparent complications Patient did tolerate procedure well. Chest X-ray ordered to verify placement.  CXR: pending.  Layonna Dobie A Young Mulvey 08/21/2018, 2:10 PM

## 2018-08-21 NOTE — Progress Notes (Addendum)
Shift event note: Pt with recurring hypotension since admission on 08/20/2018 for Sepsis, AKI on CKD w/ significant h/o biventricular CHF (EF 15%). Pt known to be hypotensiove at baseline (80's/60's) but current BP's running well below baseline despite IVF's, several IVF boluses and Midodrin BID. At beginning of shift BP had improved to 117/62. However multiple calls from bedside RN since approx 2100 regarding persistent hypotension w/ MAPS in the 40's at times. Minimal to no response to a 500cc IFVB. Pt has had no UOP since beginning of shift though remains A/A & / O x 3.  Assessment/Plan: 1. Persistent hypotension: Likely multifactorial given cardiorenal pathology exacerbated by infectious process. Discussed pt w/ Dr Georgette Shell w/ cardiology service for input who recommended Levophed if vasopressors required. Discussed pt w/ Dr Genevive Bi w/ [facility] regarding her input as it relates to starting Levophed. After assessing pt she felt pt still somewhat hypovolemic and she has decided to start a 2nd 500 cc IVF bolus for now. She has asked RN to update her w/ BP after bolus complete. Will hold off on pressors for now. Will continue to monitor closely in SDU.  Jeryl Columbia, NP-C [facility] Pager [phone]

## 2018-08-21 NOTE — Progress Notes (Signed)
Pt BP 68/48, confirmed manually, Darrick Meigs, MD notified. Verbal order received for 250cc NS bolus and albumin. Will carry out orders and continue to monitor.

## 2018-08-21 NOTE — Progress Notes (Signed)
[facility] Infusion Coordinator will follow with ID team to support IV ABX if needed at DC to home.  If patient discharges after hours, please call [phone].   Larry Sierras 08/21/2018, 10:45 AM

## 2018-08-21 NOTE — Progress Notes (Signed)
[facility] Progress Note  Subjective: had 5L paracentesis this morning, dropped bp into 60's and started on pressors.   Vitals:   08/21/18 1454 08/21/18 1500 08/21/18 1515 08/21/18 1530  BP: (!) 89/54 (!) 80/50 (!) 83/55 (!) 87/53  Pulse: (!) 116 (!) 107 (!) 109 (!) 104  Resp: 19 14 16 19   Temp:      TempSrc:      SpO2: 100% 100% 98% 95%  Weight:      Height:        Inpatient medications: . allopurinol  300 mg Oral Daily  . heparin injection (subcutaneous)  5,000 Units Subcutaneous Q8H  . levothyroxine  125 mcg Oral Q0600  . mouth rinse  15 mL Mouth Rinse BID  . midodrine  10 mg Oral TID WC  . pantoprazole  40 mg Oral BID  . pravastatin  40 mg Oral q1800  . sodium bicarbonate  650 mg Oral BID  . sodium zirconium cyclosilicate  10 g Oral TID  . vancomycin variable dose per unstable renal function (pharmacist dosing)   Does not apply See admin instructions   . sodium chloride 10 mL/hr at 08/21/18 1530  . norepinephrine (LEVOPHED) Adult infusion 4 mcg/min (08/21/18 1530)  . piperacillin-tazobactam (ZOSYN)  IV Stopped (08/21/18 1513)   nitroGLYCERIN, ondansetron **OR** ondansetron (ZOFRAN) IV, senna-docusate, traMADol  Iron/TIBC/Ferritin/ %Sat    Component Value Date/Time   IRON 47 (L) 08/15/2018 1041   TIBC 234 (L) 08/15/2018 1041   FERRITIN 543 (H) 08/15/2018 1041   IRONPCTSAT 20 08/15/2018 1041    Exam: Gen chronically ill appearing, alert, dry mouth No rash, cyanosis or gangrene Sclera anicteric, throat clear No jvd or bruits Chest clear bilat to bases, no rales or wheezing RRR no MRG Abd protuberant, nontender, 3+ ascites, +BS GU normal male MS no joint effusions or deformity Ext 2+ chronic pitting dependent thigh edema, no wounds or ulcers Neuro is alert, Ox 3 , nf, no asterixis    Home meds:  - apixaban 5 bid/  pravastatin 40 qd  - epleronone 25 bid/ metolazone 5 mg mwf/ KCL 15 ml tid (40 mqd/ 24ml)/ torsemide 80 hs  - pantoprazole 40  bid/ levothyroxine 125 ug/ allopurinol 300 qd/ vitamins/ prns  - midodrine 10 bid/ sl ntg prn   CXR - no edema, CM  ECHO 5/19 - LVEF 15%, diffuse hypoK, mod MR, mod decreased RV fxn, biatrial enlargement   Impression/ Plan: 1. AKI on CKD 3 - from hypotension and relative vol depletion/ paracentesis today. Underlying cardiorenal syndrome w/ worsening renal function over past year. Cont supportive care.  Not good CRRT candidate (see below).  Resume gentle IVF/ bicarb gtt.  2. Sepsis - +8000 cell ct on peritoneal fluid, getting IV abx for possible SBP 3. Hyperkalemia - K+ down some, got repeat Kayexalate x 1 this am 30 gm. Cont Lokelma. Cont renal diet.   4. Cirrhosis - w/ ascites, and sp 5L paracentesis this am 5. Severe CM EF 15% 6. Hyponatremia - Na better today 7. Atrial fib, chronic - per cardiology 8. Acute / Chronic hypotension - getting midodrine 10 tid, IV albumin, gentle IVF"s and pressors now 9. Amyloid cardiomyopathy 10. Prognosis - discussed EOL issues w/ pt's wife again today, she states regarding dialysis that Dr Justin Mend has already told them recently in the office that dialysis is not an option due to severe morbidities and her are her husband are both accepting of that.  They wish to continue supportive care  at this time and they are working w/ their daughter on living will / POA documents at this time.      Kelly Splinter MD [facility] pager [phone]   08/21/2018, 4:25 PM   Recent Labs  Lab 08/20/18 1303  08/20/18 1851 08/21/18 0340  NA  --   --  128* 130*  K  --    < > 6.4* 6.6*  CL  --   --  104 103  CO2  --   --  14* 15*  GLUCOSE  --   --  82 91  BUN  --   --  90* 87*  CREATININE  --   --  3.25* 3.17*  CALCIUM  --   --  8.7* 8.8*  ALBUMIN 3.1*  --   --  2.8*   < > = values in this interval not displayed.   Recent Labs  Lab 08/20/18 1303 08/21/18 0340  AST 58* 43*  ALT 27 27  ALKPHOS 132* 113  BILITOT 1.9* 2.1*  PROT 6.8 6.0*    Recent Labs  Lab 08/20/18 1250 08/21/18 0340  WBC 20.8* 17.8*  NEUTROABS 19.5*  --   HGB 11.2* 10.8*  HCT 34.4* 33.7*  MCV 97.5 98.3  PLT 215 195

## 2018-08-21 NOTE — Consult Note (Addendum)
Date of Admission:  08/20/2018          Reason for Consult: Sepsis in patient with leg wounds and ascites   Referring Provider: Dr. Lonny Prude   Assessment:  1. Sepsis possibly due : 2.  SBP vs 3. Chronic Leg wounds with acute worsening 4. DIlated CM 5. Atrial fibrillation 6. Recurrent ascites  Plan:  1. OK with vancomycin and Zosyn for now 2. Follow-up cultures from peritoneal fluid and blood 3. Image bilateral lower extremities with MRI when possible 4. Continue aggressive support with fluid boluses   Active Problems:   CAD (coronary artery disease), native coronary artery   Dilated cardiomyopathy (HCC)   Hyperlipidemia   Chronic systolic heart failure (HCC)   CKD (chronic kidney disease)   Ascites   Right heart failure (secondary to left heart failure) (HCC)   Chronic hypotension   Sepsis (HCC)   Hyperkalemia, diminished renal excretion   Pressure injury of skin   Hyponatremia   Atrial fibrillation, chronic   Scheduled Meds: . allopurinol  300 mg Oral Daily  . heparin injection (subcutaneous)  5,000 Units Subcutaneous Q8H  . levothyroxine  125 mcg Oral Q0600  . mouth rinse  15 mL Mouth Rinse BID  . midodrine  10 mg Oral TID WC  . pantoprazole  40 mg Oral BID  . pravastatin  40 mg Oral q1800  . sodium bicarbonate  650 mg Oral BID  . sodium zirconium cyclosilicate  10 g Oral TID  . vancomycin variable dose per unstable renal function (pharmacist dosing)   Does not apply See admin instructions   Continuous Infusions: . sodium chloride    . norepinephrine (LEVOPHED) Adult infusion    . piperacillin-tazobactam (ZOSYN)  IV 3.375 g (08/21/18 1113)   PRN Meds:.nitroGLYCERIN, ondansetron **OR** ondansetron (ZOFRAN) IV, senna-docusate, traMADol  HPI: Cody Schwartz is a 62 y.o. male with coronary artery disease dilated cardiomyopathy, with an ejection fraction of 15% and severe ventricular dysfunction, chronic kidney disease, cirrhosis recurrent ascites of  required drainage presented to the hospital with weakness which was beginning in his legs.  He reported reduced oral intake of days prior to admission continue to take his heart failure as directed was taking increased doses of ibuprofen.  In the ER he was afebrile but with some episodes of hypotension blood cultures were obtained he had some chronic wounds in his legs which were reportedly draining more material that he does not tell me that himself this morning.  Note the patient is still fairly sick and is a bit confused and is hard to get a very reliable history from him.  As mentioned blood cultures were drawn wound care consulted and he was started on broad-spectrum antibiotics in the form of vancomycin and Zosyn.  In the interim he has undergone paracentesis which shows him to have 8365 white blood cells with 92% PMNs.  Protein is less than 3.  Based on PMN count he has SBP.  He claims he has not had this before but again I am not sure how reliable he is regarding his own medical conditions at this current moment of time.  Examining his wounds on his legs he has multiple and appear chronic in nature of the summer fairly deep and I wonder whether he could have underlying osteomyelitis.  He continues to have low blood pressure though he also had 5 L of fluid removed with his paracentesis.  I will follow along closely as mentioned would want further  imaging.  Hopefully can narrow his antibiotics soon  Review of Systems: Review of Systems  Unable to perform ROS: Acuity of condition    Past Medical History:  Diagnosis Date  . Congestive heart failure (CHF) ([place])   . Hyperlipidemia   . Hypokalemia     Social History   Tobacco Use  . Smoking status: Never Smoker  . Smokeless tobacco: Never Used  Substance Use Topics  . Alcohol use: No    Frequency: Never  . Drug use: Never    Family History  Problem Relation Age of Onset  . CAD Paternal Grandmother   . Stroke Paternal  Grandfather   . CAD Paternal Grandfather   . Colon cancer Neg Hx    Allergies  Allergen Reactions  . Spironolactone Itching    OBJECTIVE: Blood pressure (!) 71/50, pulse (!) 116, temperature 98.4 F (36.9 C), temperature source Axillary, resp. rate 17, height 5\' 8"  (1.727 m), weight 78.6 kg, SpO2 97 %.  Physical Exam  Constitutional: He appears lethargic.  HENT:  Mouth/Throat: Oropharynx is clear and moist.  Eyes: Conjunctivae are normal.  Neck: No tracheal deviation present.  Pulmonary/Chest: No respiratory distress. He has no wheezes. He has no rales.  Abdominal: Soft. He exhibits distension. He exhibits no mass. There is no tenderness. There is no rebound and no guarding.  Neurological: He appears lethargic. No cranial nerve deficit or sensory deficit.  Skin: There is erythema.  Psychiatric: He has a normal mood and affect. Judgment and thought content normal. His speech is delayed. He is slowed. Cognition and memory are impaired.   08/21/18: leg wounds  Right leg anterior    Lateral    Left leg:          Lab Results Lab Results  Component Value Date   WBC 17.8 (H) 08/21/2018   HGB 10.8 (L) 08/21/2018   HCT 33.7 (L) 08/21/2018   MCV 98.3 08/21/2018   PLT 195 08/21/2018    Lab Results  Component Value Date   CREATININE 3.17 (H) 08/21/2018   BUN 87 (H) 08/21/2018   NA 130 (L) 08/21/2018   K 6.6 (HH) 08/21/2018   CL 103 08/21/2018   CO2 15 (L) 08/21/2018    Lab Results  Component Value Date   ALT 27 08/21/2018   AST 43 (H) 08/21/2018   ALKPHOS 113 08/21/2018   BILITOT 2.1 (H) 08/21/2018     Microbiology: Recent Results (from the past 240 hour(s))  Blood Culture (routine x 2)     Status: None (Preliminary result)   Collection Time: 08/20/18  1:51 PM  Result Value Ref Range Status   Specimen Description   Final    RIGHT ANTECUBITAL Performed at [facility], [place] [address], [place], [place] [address]    Special Requests    Final    BOTTLES DRAWN AEROBIC AND ANAEROBIC Blood Culture adequate volume Performed at [facility] [address], [place], [place] [address]    Culture   Final    NO GROWTH < 24 HOURS Performed at [facility] [address] [place], [place] [address]    Report Status PENDING  Incomplete  Blood Culture (routine x 2)     Status: None (Preliminary result)   Collection Time: 08/20/18  1:51 PM  Result Value Ref Range Status   Specimen Description   Final    LEFT ANTECUBITAL Performed at [facility] [address], [place], [place] [address]  Special Requests   Final    BOTTLES DRAWN AEROBIC AND ANAEROBIC Blood Culture adequate volume Performed at [facility] [address], [place], [place] [address]    Culture   Final    NO GROWTH < 24 HOURS Performed at [facility] [address] [place], [place] [address]    Report Status PENDING  Incomplete  MRSA PCR Screening     Status: None   Collection Time: 08/20/18  4:12 PM  Result Value Ref Range Status   MRSA by PCR NEGATIVE NEGATIVE Final    Comment:        The GeneXpert MRSA Assay (FDA approved for NASAL specimens only), is one component of a comprehensive MRSA colonization surveillance program. It is not intended to diagnose MRSA infection nor to guide or monitor treatment for MRSA infections. Performed at [facility], [place] [address], [place], [place] [address]     Alcide Evener, [facility] [phone] pager  08/21/2018, 1:16 PM

## 2018-08-21 NOTE — Procedures (Signed)
US guided paracentesis.  Removed 5 liters of cloudy yellow fluid.  Fluid send for labs. Minimal blood loss and no immediate complication.

## 2018-08-21 NOTE — Consult Note (Signed)
NAME:  Cody Schwartz, MRN:  [id number], DOB:  18-Nov-1955, LOS: 1 ADMISSION DATE:  08/20/2018, CONSULTATION DATE: 08/21/2018 REFERRING MD: Dr. Darrick Meigs, CHIEF COMPLAINT: Sepsis  Brief History   Patient with a history of biventricular failure ejection fraction of 15% Pulmonary hypertension, liver cirrhosis,  History of present illness   Presented with generalized weakness, hypotension Known to have ascites Did have a paracentesis performed-removal of 5 L of yellow fluid Acute on chronic kidney disease  Past Medical History  Biventricular heart failure Ischemic cardia myopathy Coronary artery disease Liver cirrhosis Amyloid cardiomyopathy Chronic kidney disease Acute kidney injury Significant Hospital Events   Paracentesis on 08/21/2018 for 5 L of fluid Persistent hypotension  Consults:  Cardiology Radiology Renal  Procedures:  Paracentesis 11/20 2019  Significant Diagnostic Tests:    Micro Data:  Ascitic fluid 08/20/2018 Blood cultures 08/20/2018  Antimicrobials:  Zosyn 11/27>> Vancomycin 11/27>>  Interim history/subjective:  Awake and alert, denies any significant specific complaints Feels tired  Objective   Blood pressure (!) 68/52, pulse (!) 114, temperature 98.4 F (36.9 C), temperature source Axillary, resp. rate 16, height 5\' 8"  (1.727 m), weight 78.6 kg, SpO2 98 %.        Intake/Output Summary (Last 24 hours) at 08/21/2018 1307 Last data filed at 08/21/2018 1204 Gross per 24 hour  Intake 3788.84 ml  Output 314 ml  Net 3474.84 ml   Filed Weights   08/20/18 1351  Weight: 78.6 kg    Examination: General: Awake and alert, conversant with no problems HENT: Moist oral mucosa Lungs: Clear to auscultation Cardiovascular: S1-S2 appreciated Abdomen: Soft, nontender Extremities: Edema Neuro: Somnolent but arousable, no focal deficit  Resolved Hospital Problem list     Assessment & Plan:  Sepsis -We will continue antibiotics at the present  time -Follow cultures -Follow ascitic fluid cultures -Follow blood cultures  -Acute kidney injury -This may be related to ongoing hypotension  -Possible cardiorenal syndrome -Follow electrolytes -Avoid nephrotoxic's  Chronic kidney disease -Likely related to cardiorenal  Pulmonary hypertension -Secondary to biventricular heart disease  Biventricular heart failure -Cardiology following -Hold diuretics with his hypotension  Amyloid cardiomyopathy  Persistent hypotension -We will initiate pressors   Best practice:  Diet: As tolerated DVT prophylaxis: On heparin GI prophylaxis: Protonix Mobility: Bedrest Code Status: Full code Family Communication: No family at bedside Disposition:   Labs   CBC: Recent Labs  Lab 08/20/18 1250 08/21/18 0340  WBC 20.8* 17.8*  NEUTROABS 19.5*  --   HGB 11.2* 10.8*  HCT 34.4* 33.7*  MCV 97.5 98.3  PLT 215 416    Basic Metabolic Panel: Recent Labs  Lab 08/15/18 1041 08/20/18 1250 08/20/18 1633 08/20/18 1851 08/21/18 0340  NA 125* 126*  --  128* 130*  K 5.7* 7.0* 6.9* 6.4* 6.6*  CL 91* 96*  --  104 103  CO2 20 15*  --  14* 15*  GLUCOSE 140* 111*  --  82 91  BUN 74* 94*  --  90* 87*  CREATININE 2.29* 3.37*  --  3.25* 3.17*  CALCIUM 8.9 9.2  --  8.7* 8.8*   GFR: Estimated Creatinine Clearance: 22.8 mL/min (A) (by C-G formula based on SCr of 3.17 mg/dL (H)). Recent Labs  Lab 08/20/18 1250  08/20/18 1633 08/20/18 1851 08/20/18 2248 08/21/18 0340  WBC 20.8*  --   --   --   --  17.8*  LATICACIDVEN  --    < > 2.0* 2.5* 2.0* 1.7   < > = values  in this interval not displayed.    Liver Function Tests: Recent Labs  Lab 08/15/18 1041 08/20/18 1303 08/21/18 0340  AST 20 58* 43*  ALT 14 27 27   ALKPHOS 145* 132* 113  BILITOT 1.5* 1.9* 2.1*  PROT 6.3 6.8 6.0*  ALBUMIN 3.4* 3.1* 2.8*   No results for input(s): LIPASE, AMYLASE in the last 168 hours. Recent Labs  Lab 08/20/18 1633  AMMONIA 14    ABG      Component Value Date/Time   PHART 7.452 (H) 01/10/2018 1338   PCO2ART 25.9 (L) 01/10/2018 1338   PO2ART 78.0 (L) 01/10/2018 1338   HCO3 18.1 (L) 01/10/2018 1338   TCO2 19 (L) 01/10/2018 1338   ACIDBASEDEF 4.0 (H) 01/10/2018 1338   O2SAT 96.0 01/10/2018 1338     Coagulation Profile: No results for input(s): INR, PROTIME in the last 168 hours.  Cardiac Enzymes: No results for input(s): CKTOTAL, CKMB, CKMBINDEX, TROPONINI in the last 168 hours.  HbA1C: No results found for: HGBA1C  CBG: Recent Labs  Lab 08/20/18 1311  GLUCAP 107*    Review of Systems:   Review of Systems  Constitutional: Positive for malaise/fatigue.  Eyes: Negative.   Respiratory: Positive for shortness of breath.   Cardiovascular: Negative.  Negative for chest pain.  All other systems reviewed and are negative.    Past Medical History  He,  has a past medical history of Congestive heart failure (CHF) ([place]), Hyperlipidemia, and Hypokalemia.   Surgical History    Past Surgical History:  Procedure Laterality Date  . BIOPSY  08/05/2018   Procedure: BIOPSY;  Surgeon: Lavena Bullion, DO;  Location: [facility];  Service: Gastroenterology;;  . ESOPHAGOGASTRODUODENOSCOPY (EGD) WITH PROPOFOL N/A 08/05/2018   Procedure: ESOPHAGOGASTRODUODENOSCOPY (EGD) WITH PROPOFOL;  Surgeon: Lavena Bullion, DO;  Location: [facility];  Service: Gastroenterology;  Laterality: N/A;  . liver biopsy  07/22/2018  . RIGHT/LEFT HEART CATH AND CORONARY ANGIOGRAPHY N/A 01/10/2018   Procedure: RIGHT/LEFT HEART CATH AND CORONARY ANGIOGRAPHY;  Surgeon: Jolaine Artist, MD;  Location: [facility];  Service: Cardiovascular;  Laterality: N/A;     Social History   reports that he has never smoked. He has never used smokeless tobacco. He reports that he does not drink alcohol or use drugs.   Family History   His family history includes CAD in his paternal grandfather and paternal grandmother; Stroke in his paternal  grandfather. There is no history of Colon cancer.   Allergies Allergies  Allergen Reactions  . Spironolactone Itching     Home Medications  Prior to Admission medications   Medication Sig Start Date End Date Taking? Authorizing Provider  allopurinol (ZYLOPRIM) 300 MG tablet Take 300 mg by mouth daily. Take with 100 mg to equal 400 mg daily 03/09/17  Yes [provider]  apixaban (ELIQUIS) 5 MG TABS tablet Take 1 tablet (5 mg total) by mouth 2 (two) times daily. 01/02/18  Yes Bensimhon, Shaune Pascal, MD  cephALEXin (KEFLEX) 250 MG capsule Take 1 capsule (250 mg total) by mouth 4 (four) times daily. Take for five days 07/30/18  Yes Willia Craze, NP  eplerenone (INSPRA) 25 MG tablet Take 1 tablet (25 mg total) by mouth 2 (two) times daily. 06/11/18  Yes Bensimhon, Shaune Pascal, MD  levothyroxine (SYNTHROID, LEVOTHROID) 125 MCG tablet Take 125 mcg by mouth daily before breakfast.  11/05/17  Yes [provider]  metolazone (ZAROXOLYN) 5 MG tablet Take 1 tablet (5 mg total) by mouth 3 (  three) times a week. Every Monday, Wed and Friday Patient taking differently: Take 5 mg by mouth every Monday, Wednesday, and Friday.  03/19/18 08/20/18 Yes Bensimhon, Shaune Pascal, MD  midodrine (PROAMATINE) 10 MG tablet Take 1 tablet (10 mg total) by mouth 3 (three) times daily with meals. Patient taking differently: Take 10 mg by mouth 2 (two) times daily with a meal.  06/11/18  Yes Bensimhon, Shaune Pascal, MD  pantoprazole (PROTONIX) 40 MG tablet Take 1 tablet (40 mg total) by mouth 2 (two) times daily. 08/06/18 10/01/18 Yes Cirigliano, Vito V, DO  Potassium Chloride 40 MEQ/15ML (20%) SOLN Take 22.5 mLs by mouth 3 (three) times daily AND 15 mLs 3 (three) times a week. MON, WED, FRI. Patient taking differently: Take 15 ml 3 times daily 04/24/18  Yes Larey Dresser, MD  pravastatin (PRAVACHOL) 40 MG tablet Take 1 tablet (40 mg total) by mouth daily. 03/29/17  Yes [name], MD  sodium bicarbonate 650 MG tablet  Take 650 mg by mouth 2 (two) times daily. 08/01/18  Yes [provider]  torsemide (DEMADEX) 20 MG tablet Take 5 tabs in AM and 4 tabs in PM Patient taking differently: Take 80 mg by mouth every evening.  06/11/18  Yes Bensimhon, Shaune Pascal, MD  Vitamin D, Ergocalciferol, (DRISDOL) 50000 units CAPS capsule Take 50,000 Units by mouth every Monday.    Yes [provider]  nitroGLYCERIN (NITROSTAT) 0.4 MG SL tablet Place 1 tablet (0.4 mg total) under the tongue every 5 (five) minutes as needed for chest pain. 03/29/17   [name], MD     Critical care time: 30 min Critical care time spent evaluating records, formulating plan of care

## 2018-08-22 ENCOUNTER — Inpatient Hospital Stay (HOSPITAL_COMMUNITY): Payer: BLUE CROSS/BLUE SHIELD | Admitting: Certified Registered Nurse Anesthetist

## 2018-08-22 DIAGNOSIS — K652 Spontaneous bacterial peritonitis: Secondary | ICD-10-CM

## 2018-08-22 DIAGNOSIS — N179 Acute kidney failure, unspecified: Secondary | ICD-10-CM

## 2018-08-22 LAB — BLOOD GAS, ARTERIAL
ACID-BASE DEFICIT: 9.8 mmol/L — AB (ref 0.0–2.0)
Bicarbonate: 13.2 mmol/L — ABNORMAL LOW (ref 20.0–28.0)
Drawn by: 422461
FIO2: 21
O2 Saturation: 96.2 %
Patient temperature: 97
pCO2 arterial: 21.3 mmHg — ABNORMAL LOW (ref 32.0–48.0)
pH, Arterial: 7.405 (ref 7.350–7.450)
pO2, Arterial: 80.7 mmHg — ABNORMAL LOW (ref 83.0–108.0)

## 2018-08-22 LAB — COMPREHENSIVE METABOLIC PANEL
ALT: 22 U/L (ref 0–44)
AST: 34 U/L (ref 15–41)
Albumin: 2.6 g/dL — ABNORMAL LOW (ref 3.5–5.0)
Alkaline Phosphatase: 105 U/L (ref 38–126)
Anion gap: 16 — ABNORMAL HIGH (ref 5–15)
BILIRUBIN TOTAL: 2.7 mg/dL — AB (ref 0.3–1.2)
BUN: 84 mg/dL — ABNORMAL HIGH (ref 8–23)
CO2: 13 mmol/L — ABNORMAL LOW (ref 22–32)
Calcium: 7.9 mg/dL — ABNORMAL LOW (ref 8.9–10.3)
Chloride: 98 mmol/L (ref 98–111)
Creatinine, Ser: 3.29 mg/dL — ABNORMAL HIGH (ref 0.61–1.24)
GFR calc Af Amer: 22 mL/min — ABNORMAL LOW (ref >60)
GFR calc non Af Amer: 19 mL/min — ABNORMAL LOW (ref >60)
Glucose, Bld: 186 mg/dL — ABNORMAL HIGH (ref 70–99)
Potassium: 7.1 mmol/L (ref 3.5–5.1)
Sodium: 127 mmol/L — ABNORMAL LOW (ref 135–145)
Total Protein: 5.7 g/dL — ABNORMAL LOW (ref 6.5–8.1)

## 2018-08-22 LAB — CBC
HCT: 35.6 % — ABNORMAL LOW (ref 39.0–52.0)
Hemoglobin: 11.4 g/dL — ABNORMAL LOW (ref 13.0–17.0)
MCH: 31.6 pg (ref 26.0–34.0)
MCHC: 32 g/dL (ref 30.0–36.0)
MCV: 98.6 fL (ref 80.0–100.0)
Platelets: 188 10*3/uL (ref 150–400)
RBC: 3.61 MIL/uL — ABNORMAL LOW (ref 4.22–5.81)
RDW: 15.7 % — AB (ref 11.5–15.5)
WBC: 24.4 10*3/uL — ABNORMAL HIGH (ref 4.0–10.5)
nRBC: 0.8 % — ABNORMAL HIGH (ref 0.0–0.2)

## 2018-08-22 LAB — PATHOLOGIST SMEAR REVIEW

## 2018-08-22 LAB — POTASSIUM: Potassium: 4.8 mmol/L (ref 3.5–5.1)

## 2018-08-22 MED ORDER — LINEZOLID 600 MG PO TABS
600.0000 mg | ORAL_TABLET | Freq: Two times a day (BID) | ORAL | Status: DC
  Administered 2018-08-22: 600 mg via ORAL
  Filled 2018-08-22 (×2): qty 1

## 2018-08-22 MED ORDER — SODIUM CHLORIDE 0.9 % IV SOLN
Freq: Once | INTRAVENOUS | Status: AC
  Administered 2018-08-22: 10:00:00 via INTRAVENOUS

## 2018-08-22 MED ORDER — SODIUM CHLORIDE 0.9 % IV BOLUS: 500.0000 mL | Freq: Once | INTRAVENOUS | Status: DC

## 2018-08-22 MED ORDER — PHENYLEPHRINE HCL-NACL 10-0.9 MG/250ML-% IV SOLN
0.0000 ug/min | INTRAVENOUS | Status: DC
  Administered 2018-08-22: 20 ug/min via INTRAVENOUS
  Filled 2018-08-22 (×2): qty 250

## 2018-08-22 MED ORDER — SODIUM ZIRCONIUM CYCLOSILICATE 10 G PO PACK
10.0000 g | PACK | Freq: Every day | ORAL | Status: DC
  Filled 2018-08-22 (×2): qty 1

## 2018-08-22 MED ORDER — VANCOMYCIN HCL IN DEXTROSE 1-5 GM/200ML-% IV SOLN: 1000.0000 mg | INTRAVENOUS | Status: DC

## 2018-08-22 MED ORDER — VASOPRESSIN 20 UNIT/ML IV SOLN
0.0300 [IU]/min | INTRAVENOUS | Status: DC
  Filled 2018-08-22: qty 2

## 2018-08-22 MED ORDER — SODIUM CHLORIDE 0.9% FLUSH: 10.0000 mL | INTRAVENOUS | Status: DC | PRN

## 2018-08-22 MED ORDER — ALBUMIN HUMAN 25 % IV SOLN
12.5000 g | Freq: Once | INTRAVENOUS | Status: AC
  Administered 2018-08-22: 12.5 g via INTRAVENOUS
  Filled 2018-08-22: qty 100

## 2018-08-22 MED ORDER — COLLAGENASE 250 UNIT/GM EX OINT
TOPICAL_OINTMENT | Freq: Every day | CUTANEOUS | Status: DC
  Administered 2018-08-22 – 2018-08-25 (×4): via TOPICAL
  Filled 2018-08-22: qty 90

## 2018-08-22 MED ORDER — CHLORHEXIDINE GLUCONATE CLOTH 2 % EX PADS
6.0000 | MEDICATED_PAD | Freq: Every day | CUTANEOUS | Status: DC
  Administered 2018-08-22 – 2018-08-26 (×4): 6 via TOPICAL

## 2018-08-22 MED ORDER — SODIUM CHLORIDE 0.9% FLUSH
10.0000 mL | Freq: Two times a day (BID) | INTRAVENOUS | Status: DC
  Administered 2018-08-22: 10 mL
  Administered 2018-08-22: 30 mL
  Administered 2018-08-22 – 2018-08-25 (×6): 10 mL

## 2018-08-22 NOTE — Progress Notes (Signed)
Pt very hesitant about trying CPAP QHS since Pt does not have one at home or a history of OSA.  Pt has requested to try O2 via [place] due to having desats during the night.  RT/RN in agreement, RT will monitor and initiate CPAP QHS if needed.

## 2018-08-22 NOTE — Consult Note (Addendum)
[facility] Nurse wound consult note Reason for Consult: Consult requested for bilat legs.  Pt states he developed multiple blisters, which ruptured and evolved into full thickness wounds. Appearance is unusual and etiology is unknown.  There are several darker-colored raised lesions which have not evolved into wounds at this time, but appearance is consistent with the way this complex medical condition sometimes presents in the beginning; may be related to possible Calciphylaxis.  This would require a biopsy for diagnosis; discussed with patient and primary team at bedside during assessment. Wound type: Multiple patchy areas of partial thickness skin loss, pink and moist, mod amt yellow drainage, no odor.  Right leg; 1.5X1.5X.1cm anterior calf  .5X.5X.1cm right anterior calf 1X1X.1cm right posterior calf Left leg 1X1X.1cm anterior calf 1X.8X.1cm, left posterior calf  Several full thickness necrotic wounds, 100% slough/eschar, mod amt tan drainage, some odor and fluctuance. Right anterior leg 2.5X1.5cm  .5X.5cm right ankle Left anterior calf 2.5X2.5cm Left below anterior knee 2X3cm Left posterior ankle 1X1cm Left posterior ankle .8X.8cm Left posterior leg .5X.5cm  Dressing procedure/placement/frequency: Santyl to provide enzymatic debridement of nonviable tissue to necrotic wounds.  Xerforom gauze to promote drying and healing to the partial thickness wounds, ABD pads and kerlex to protect from further injury. Please re-consult if further assistance is needed.  Thank-you,  Julien Girt MSN, [place], [place], [place], [place]

## 2018-08-22 NOTE — Progress Notes (Signed)
Subjective: \Patient feels better today he is on pressors though   Antibiotics:  Anti-infectives (From admission, onward)   Start     Dose/Rate Route Frequency Ordered Stop   08/23/18 2200  vancomycin (VANCOCIN) IVPB 1000 mg/200 mL premix  Status:  Discontinued     1,000 mg 200 mL/hr over 60 Minutes Intravenous Every 48 hours 08/22/18 0933 08/22/18 1641   08/22/18 2200  linezolid (ZYVOX) tablet 600 mg     600 mg Oral Every 12 hours 08/22/18 1635     08/21/18 2000  vancomycin (VANCOCIN) IVPB 1000 mg/200 mL premix     1,000 mg 200 mL/hr over 60 Minutes Intravenous  Once 08/21/18 1949 08/21/18 2138   08/20/18 2000  piperacillin-tazobactam (ZOSYN) IVPB 3.375 g     3.375 g 12.5 mL/hr over 240 Minutes Intravenous Every 8 hours 08/20/18 1638     08/20/18 1728  vancomycin variable dose per unstable renal function (pharmacist dosing)  Status:  Discontinued      Does not apply See admin instructions 08/20/18 1728 08/22/18 1641   08/20/18 1330  vancomycin (VANCOCIN) IVPB 1000 mg/200 mL premix     1,000 mg 200 mL/hr over 60 Minutes Intravenous  Once 08/20/18 1325 08/20/18 1754   08/20/18 1330  piperacillin-tazobactam (ZOSYN) IVPB 3.375 g     3.375 g 100 mL/hr over 30 Minutes Intravenous  Once 08/20/18 1325 08/20/18 1518      Medications: Scheduled Meds: . allopurinol  300 mg Oral Daily  . Chlorhexidine Gluconate Cloth  6 each Topical Daily  . collagenase   Topical Daily  . heparin injection (subcutaneous)  5,000 Units Subcutaneous Q8H  . levothyroxine  125 mcg Oral Q0600  . linezolid  600 mg Oral Q12H  . mouth rinse  15 mL Mouth Rinse BID  . midodrine  10 mg Oral TID WC  . pantoprazole  40 mg Oral BID  . pravastatin  40 mg Oral q1800  . sodium bicarbonate  650 mg Oral BID  . sodium chloride flush  10-40 mL Intracatheter Q12H  . [START ON 08/23/2018] sodium zirconium cyclosilicate  10 g Oral Daily   Continuous Infusions: . sodium chloride 10 mL/hr at 08/22/18 1700  .  norepinephrine (LEVOPHED) Adult infusion 38 mcg/min (08/22/18 1700)  . phenylephrine (NEO-SYNEPHRINE) Adult infusion Stopped (08/22/18 0759)  . piperacillin-tazobactam (ZOSYN)  IV Stopped (08/22/18 1601)  .  sodium bicarbonate (isotonic) infusion in sterile water 50 mL/hr at 08/22/18 1700   PRN Meds:.nitroGLYCERIN, ondansetron **OR** ondansetron (ZOFRAN) IV, senna-docusate, sodium chloride flush, traMADol    Objective: Weight change: -6.963 kg  Intake/Output Summary (Last 24 hours) at 08/22/2018 1805 Last data filed at 08/22/2018 1700 Gross per 24 hour  Intake 4592.57 ml  Output 880 ml  Net 3712.57 ml   Blood pressure (!) 80/59, pulse (!) 122, temperature (!) 97.3 F (36.3 C), temperature source Oral, resp. rate 15, height 5\' 8"  (1.727 m), weight 71.6 kg, SpO2 93 %. Temp:  [97.3 F (36.3 C)-98.6 F (37 C)] 97.3 F (36.3 C) (11/29 1200) Pulse Rate:  [27-135] 122 (11/29 1715) Resp:  [12-30] 15 (11/29 1715) BP: (51-149)/(25-131) 80/59 (11/29 0630) SpO2:  [69 %-100 %] 93 % (11/29 1715) Arterial Line BP: (69-98)/(40-64) 93/57 (11/29 1715) Weight:  [71.6 kg] 71.6 kg (11/29 0500)  Physical Exam: General: Alert and awake, oriented x3, not in any acute distress. HEENT: anicteric sclera, EOMI CVS regular rate, normal  Chest: , no wheezing, no respiratory distress Abdomen: soft  but distended Extremities: Legs wrapped in bandages Neuro: nonfocal  CBC:    BMET Recent Labs    08/21/18 0340 08/22/18 0338 08/22/18 0600  NA 130* 127*  --   K 6.6* 7.1* 4.8  CL 103 98  --   CO2 15* 13*  --   GLUCOSE 91 186*  --   BUN 87* 84*  --   CREATININE 3.17* 3.29*  --   CALCIUM 8.8* 7.9*  --      Liver Panel  Recent Labs    08/20/18 1303 08/21/18 0340 08/22/18 0338  PROT 6.8 6.0* 5.7*  ALBUMIN 3.1* 2.8* 2.6*  AST 58* 43* 34  ALT 27 27 22   ALKPHOS 132* 113 105  BILITOT 1.9* 2.1* 2.7*  BILIDIR 1.0*  --   --   IBILI 0.9  --   --        Sedimentation Rate No results  for input(s): ESRSEDRATE in the last 72 hours. C-Reactive Protein No results for input(s): CRP in the last 72 hours.  Micro Results: Recent Results (from the past 720 hour(s))  Blood Culture (routine x 2)     Status: None (Preliminary result)   Collection Time: 08/20/18  1:51 PM  Result Value Ref Range Status   Specimen Description   Final    RIGHT ANTECUBITAL Performed at [facility] [address] [place], [place] [address]    Special Requests   Final    BOTTLES DRAWN AEROBIC AND ANAEROBIC Blood Culture adequate volume Performed at [facility] [address] [place], [place] [address]    Culture   Final    NO GROWTH 2 DAYS Performed at [facility] [address], [place], [place] [address]    Report Status PENDING  Incomplete  Blood Culture (routine x 2)     Status: None (Preliminary result)   Collection Time: 08/20/18  1:51 PM  Result Value Ref Range Status   Specimen Description   Final    LEFT ANTECUBITAL Performed at [facility] [address] [place], [place] [address]    Special Requests   Final    BOTTLES DRAWN AEROBIC AND ANAEROBIC Blood Culture adequate volume Performed at [facility] [address], [place], [place] [address]    Culture   Final    NO GROWTH 2 DAYS Performed at [facility] [address] [place], [place] [address]    Report Status PENDING  Incomplete  MRSA PCR Screening     Status: None   Collection Time: 08/20/18  4:12 PM  Result Value Ref Range Status   MRSA by PCR NEGATIVE NEGATIVE Final    Comment:        The GeneXpert MRSA Assay (FDA approved for NASAL specimens only), is one component of a comprehensive MRSA colonization surveillance program. It is not intended to diagnose MRSA infection nor to guide or monitor treatment for MRSA infections. Performed at [facility], [place] [address] [place], [place]  [address]   Culture, body fluid-bottle     Status: None (Preliminary result)   Collection Time: 08/21/18  9:31 AM  Result Value Ref Range Status   Specimen Description FLUID PERITONEAL  Final   Special Requests NONE  Final   Culture   Final    NO GROWTH < 24 HOURS Performed at [facility], [place] [address], [place], [place] [address]    Report Status PENDING  Incomplete  Gram stain     Status: None   Collection Time: 08/21/18  9:31 AM  Result Value Ref Range Status   Specimen Description FLUID PERITONEAL  Final   Special Requests NONE  Final   Gram Stain   Final    CYTOSPIN SMEAR WBC PRESENT,BOTH PMN AND MONONUCLEAR NO ORGANISMS SEEN Performed at [facility], [address], [place], [place] [address]    Report Status 08/21/2018 FINAL  Final    Studies/Results: Dg Chest 1 View  Result Date: 08/21/2018 CLINICAL DATA:  Status post central line placement EXAM: CHEST  1 VIEW COMPARISON:  08/20/2018 FINDINGS: Cardiac shadow is accentuated by the portable technique. Right jugular central line is now seen in the mid right atrium. No pneumothorax is noted. Minimal left basilar atelectasis is noted. IMPRESSION: No pneumothorax following central line placement New minimal left basilar atelectasis. This may be related to a poor inspiratory effort. Electronically Signed   By: Inez Catalina M.D.   On: 08/21/2018 14:41   US Renal  Result Date: 08/20/2018 CLINICAL DATA:  62 y/o  M; acute kidney injury. EXAM: RENAL / URINARY TRACT ULTRASOUND COMPLETE COMPARISON:  04/14/2018 right upper quadrant ultrasound. FINDINGS: Right Kidney: Renal measurements: 10.7 x 4.6 x 4.7 cm = volume: 121 mL . Echogenicity within normal limits. No mass or hydronephrosis visualized. Left Kidney: Renal measurements: 11.2 x 5.0 x 4.0 cm = volume: 117 mL. Echogenicity within normal limits. No mass or hydronephrosis visualized. Bladder: Appears normal for degree of bladder distention. Incidental note of large volume  of ascites. IMPRESSION: 1. No acute process identified.  Mild bilateral renal atrophy. 2. Large volume ascites. Electronically Signed   By: Kristine Garbe M.D.   On: 08/20/2018 19:00   US Paracentesis  Result Date: 08/21/2018 INDICATION: 62 year old with recurrent ascites and history of CHF. Patient was recently admitted to the hospital for sepsis and hyperkalemia. Plan for ultrasound-guided paracentesis with a maximum volume of 5 L. EXAM: ULTRASOUND GUIDED PARACENTESIS MEDICATIONS: None. COMPLICATIONS: None immediate. PROCEDURE: Informed written consent was obtained from the patient after a discussion of the risks, benefits and alternatives to treatment. A timeout was performed prior to the initiation of the procedure. Initial ultrasound scanning demonstrates a large amount of ascites within the left lower abdominal quadrant. The left lower abdomen was prepped and draped in the usual sterile fashion. 1% lidocaine with was used for local anesthesia. Following this, a 6 Fr Safe-T-Centesis catheter was introduced. An ultrasound image was saved for documentation purposes. The paracentesis was performed. The catheter was removed and a dressing was applied. The patient tolerated the procedure well without immediate post procedural complication. FINDINGS: A total of approximately 5 L of cloudy yellow fluid was removed. Samples were sent to the laboratory as requested by the clinical team. IMPRESSION: Successful ultrasound-guided paracentesis yielding 5 liters of peritoneal fluid. Electronically Signed   By: Markus Daft M.D.   On: 08/21/2018 10:30      Assessment/Plan:  INTERVAL HISTORY: Patient had central line placed and has been on pressors overnight renal function is worsened   Active Problems:   CAD (coronary artery disease), native coronary artery   Dilated cardiomyopathy (HCC)   Hyperlipidemia   Chronic systolic heart failure (HCC)   CKD (chronic kidney disease)   Ascites   Right heart  failure (secondary to left heart failure) (HCC)   Chronic hypotension   Sepsis (HCC)   Hyperkalemia, diminished renal excretion   Pressure injury of skin   Hyponatremia  Atrial fibrillation, chronic    Cody Schwartz is a 62 y.o. male with coronary artery disease with severe ischemic cardiomyopathy cirrhosis chronic kidney disease recurrent ascites admitted with a septic picture with reported worsening of lesions in his legs though the latter is not clearly the case, also abdominal distention and ascites status post paracentesis which was consistent with SBP  Patient blood cultures have remained negative and peritoneal fluid cultures are no growth so far.  Renal function has worsened in the setting of vancomycin and Zosyn and his cardio renal and septic picture.  1.  Sepsis: Presumed from SBP plus or minus but less likely wounds given it is not bacteremic  I will change the vancomycin to Zyvox to reduce further danger to his kidneys from his antibiotics and he can continue on the Zosyn follow-up cultures from blood and peritoneal fluid  When stable could consider more aggressive imaging of the legs to look for osteomyelitis  As per wound  consult a skin biopsy might also be useful maneuver.     LOS: 2 days   Alcide Evener 08/22/2018, 6:05 PM

## 2018-08-22 NOTE — Progress Notes (Signed)
I spoke with Elink RN at 0220 in regards to patient HR sustaining at 130-145 bpm and BP of 88/61. 12-lead EKG completed and placed in chart. ELink RN to convey information to [facility] MD. Primary RN, Clementeen Graham., made aware. Will continue to monitor.

## 2018-08-22 NOTE — Progress Notes (Addendum)
Progress Note  Patient Name: Cody Schwartz Date of Encounter: 08/22/2018  Primary Cardiologist: Shirlee More, MD/[place]  Subjective   Patient feels much better this AM. Still losing some focus during conversation as Dr. Acie Fredrickson commented before. Denies CP or SOB. Legs feel more flexible. Clinical situation remains tenuous with persistent hypotension despite 2 pressors, and Afib RVR.  Inpatient Medications    Scheduled Meds: . allopurinol  300 mg Oral Daily  . Chlorhexidine Gluconate Cloth  6 each Topical Daily  . heparin injection (subcutaneous)  5,000 Units Subcutaneous Q8H  . levothyroxine  125 mcg Oral Q0600  . mouth rinse  15 mL Mouth Rinse BID  . midodrine  10 mg Oral TID WC  . pantoprazole  40 mg Oral BID  . pravastatin  40 mg Oral q1800  . sodium bicarbonate  650 mg Oral BID  . sodium chloride flush  10-40 mL Intracatheter Q12H  . sodium zirconium cyclosilicate  10 g Oral TID  . vancomycin variable dose per unstable renal function (pharmacist dosing)   Does not apply See admin instructions   Continuous Infusions: . sodium chloride 10 mL/hr at 08/22/18 0830  . sodium chloride    . norepinephrine (LEVOPHED) Adult infusion 40 mcg/min (08/22/18 0830)  . phenylephrine (NEO-SYNEPHRINE) Adult infusion Stopped (08/22/18 0759)  . piperacillin-tazobactam (ZOSYN)  IV Stopped (08/22/18 0802)  .  sodium bicarbonate (isotonic) infusion in sterile water 65 mL/hr at 08/22/18 0827   PRN Meds: nitroGLYCERIN, ondansetron **OR** ondansetron (ZOFRAN) IV, senna-docusate, sodium chloride flush, traMADol   Vital Signs    Vitals:   08/22/18 0815 08/22/18 0830 08/22/18 0845 08/22/18 0900  BP:      Pulse: (!) 130 99    Resp: (!) 26 20 15  (!) 30  Temp:      TempSrc:      SpO2: 94% 96%    Weight:      Height:        Intake/Output Summary (Last 24 hours) at 08/22/2018 0903 Last data filed at 08/22/2018 0830 Gross per 24 hour  Intake 2222.89 ml  Output 601 ml  Net 1621.89  ml   Filed Weights   08/20/18 1351 08/22/18 0500  Weight: 78.6 kg 71.6 kg    Telemetry    Atrial fib RVR - Personally Reviewed. EKG with low voltage QRS possible prior anterolaterla infarct.  Physical Exam   GEN: Chronically ill appearing Caucasian male with jaundice HEENT: Normocephalic, atraumatic, sclera mildly icteric. Neck: No JVD or bruits. Cardiac: Irregularly irregular, rapid no murmurs, rubs, or gallops. Distal pulses decreased but in tact Respiratory: Coarse and mildly diminished throughout butreathing is unlabored. GI: Soft, nontender, non-distended, BS +x 4. MS: diffuse extremity ecchymosis and general evidence of poor skin healing Extremities: No clubbing or cyanosis. No current edema; legs wrapped. Neuro:  AAOx3. Follows commands. Occasionally loses focus but overall good historian today Psych:  Responds to questions appropriately with a normal affect.  Labs    Chemistry Recent Labs  Lab 08/20/18 1303  08/20/18 1851 08/21/18 0340 08/22/18 0338  NA  --   --  128* 130* 127*  K  --    < > 6.4* 6.6* 7.1*  CL  --   --  104 103 98  CO2  --   --  14* 15* 13*  GLUCOSE  --   --  82 91 186*  BUN  --   --  90* 87* 84*  CREATININE  --   --  3.25* 3.17* 3.29*  CALCIUM  --   --  8.7* 8.8* 7.9*  PROT 6.8  --   --  6.0* 5.7*  ALBUMIN 3.1*  --   --  2.8* 2.6*  AST 58*  --   --  43* 34  ALT 27  --   --  27 22  ALKPHOS 132*  --   --  113 105  BILITOT 1.9*  --   --  2.1* 2.7*  GFRNONAA  --   --  19* 20* 19*  GFRAA  --   --  22* 23* 22*  ANIONGAP  --   --  10 12 16*   < > = values in this interval not displayed.     Hematology Recent Labs  Lab 08/20/18 1250 08/21/18 0340 08/22/18 0338  WBC 20.8* 17.8* 24.4*  RBC 3.53* 3.43* 3.61*  HGB 11.2* 10.8* 11.4*  HCT 34.4* 33.7* 35.6*  MCV 97.5 98.3 98.6  MCH 31.7 31.5 31.6  MCHC 32.6 32.0 32.0  RDW 15.2 15.3 15.7*  PLT 215 195 188    Cardiac EnzymesNo results for input(s): TROPONINI in the last 168 hours.    Recent Labs  Lab 08/20/18 1349  TROPIPOC 0.02     BNP Recent Labs  Lab 08/20/18 1303  BNP 1,090.4*     DDimer No results for input(s): DDIMER in the last 168 hours.   Radiology    Dg Chest 1 View  Result Date: 08/21/2018 CLINICAL DATA:  Status post central line placement EXAM: CHEST  1 VIEW COMPARISON:  08/20/2018 FINDINGS: Cardiac shadow is accentuated by the portable technique. Right jugular central line is now seen in the mid right atrium. No pneumothorax is noted. Minimal left basilar atelectasis is noted. IMPRESSION: No pneumothorax following central line placement New minimal left basilar atelectasis. This may be related to a poor inspiratory effort. Electronically Signed   By: Inez Catalina M.D.   On: 08/21/2018 14:41   US Renal  Result Date: 08/20/2018 CLINICAL DATA:  62 y/o  M; acute kidney injury. EXAM: RENAL / URINARY TRACT ULTRASOUND COMPLETE COMPARISON:  04/14/2018 right upper quadrant ultrasound. FINDINGS: Right Kidney: Renal measurements: 10.7 x 4.6 x 4.7 cm = volume: 121 mL . Echogenicity within normal limits. No mass or hydronephrosis visualized. Left Kidney: Renal measurements: 11.2 x 5.0 x 4.0 cm = volume: 117 mL. Echogenicity within normal limits. No mass or hydronephrosis visualized. Bladder: Appears normal for degree of bladder distention. Incidental note of large volume of ascites. IMPRESSION: 1. No acute process identified.  Mild bilateral renal atrophy. 2. Large volume ascites. Electronically Signed   By: Kristine Garbe M.D.   On: 08/20/2018 19:00   US Paracentesis  Result Date: 08/21/2018 INDICATION: 62 year old with recurrent ascites and history of CHF. Patient was recently admitted to the hospital for sepsis and hyperkalemia. Plan for ultrasound-guided paracentesis with a maximum volume of 5 L. EXAM: ULTRASOUND GUIDED PARACENTESIS MEDICATIONS: None. COMPLICATIONS: None immediate. PROCEDURE: Informed written consent was obtained from the patient  after a discussion of the risks, benefits and alternatives to treatment. A timeout was performed prior to the initiation of the procedure. Initial ultrasound scanning demonstrates a large amount of ascites within the left lower abdominal quadrant. The left lower abdomen was prepped and draped in the usual sterile fashion. 1% lidocaine with was used for local anesthesia. Following this, a 6 Fr Safe-T-Centesis catheter was introduced. An ultrasound image was saved for documentation purposes. The paracentesis was performed. The catheter was removed and a dressing was  applied. The patient tolerated the procedure well without immediate post procedural complication. FINDINGS: A total of approximately 5 L of cloudy yellow fluid was removed. Samples were sent to the laboratory as requested by the clinical team. IMPRESSION: Successful ultrasound-guided paracentesis yielding 5 liters of peritoneal fluid. Electronically Signed   By: Markus Daft M.D.   On: 08/21/2018 10:30   Dg Chest Port 1 View  Result Date: 08/20/2018 CLINICAL DATA:  Weakness. Personal history of congestive heart failure. EXAM: PORTABLE CHEST 1 VIEW COMPARISON:  Two-view chest x-ray 08/02/2015 FINDINGS: Heart is enlarged. There is no edema or effusion. No focal airspace disease is present. There is some straightening of the left edge of the cardiac shadow. IMPRESSION: 1. Stable cardiomegaly without failure. 2. Straightening of the left edge of the cardiac shadow. This can be seen in the setting of a pericardial effusion. Patient has had similar appearance before without pericardial effusion. Electronically Signed   By: San Morelle M.D.   On: 08/20/2018 13:58   Patient Profile     62 y.o. male with severe biventricular heart failure due to mixed ICM/NICM EF 17%, PYP scan c/w amyloidosis (started on tafamadis, genetic testing negative for TTR), CAD s/p PCI (last in 2017), CKD, cirrhosis with ascites requiring frequent paracenteses, moderate  pulm HTN, atrial fib (dx 12/2017), bilateral leg wounds. He was admitted with setting of several days of weakness, poor PO intake, hyperkalemia. He was found to have sepsis possibly due to chronic leg wounds with acute worsening vs SBP, AKI with hyperkalemia, and worsening heart failure. Renal, ID, PCCM on board.  Labs continue to show hyponatremia, hyperkalemia, AKI with BUN 84, Cr 3.29, albumin 2.4, and rising WBC to 24k. Last CVP 19. S/p paracentesis this admission -5L fluid. Remains hypotensive despite pressors - he is on norepi and phenylephrine in addition to antibiotics.  Assessment & Plan    1. Sepsis, possibly mixed infectious and cardiogenic shock 2. Severe biventricular heart failure 3. Acute on chronic hypotension 4. Persistent atrial fib with rapid ventricular response 5. AKI on CKD with ongoing issues of hyperkalemia and hyponatremia 6. Cirrhosis with recurrent ascites requiring paracentesis 7. Possible amyloidosis on therapy as OP  Clinical situation remains tenuous with evidence of multiorgan failure. He remains hypotensive but has h/o chronic hypotension as well. I contacted patient's HF cardiologist Dr. Haroldine Laws who was already aware of the patient's hospitalization. We discussed SBPs in the 60s earlier - he suggests addition of vasopressin with aim to wean off phenylephrine from a shock standpoint, but after we spoke, nurse informed me that the phenylephrine had actually run out recently so had been off.  Most recent BP in the 80s with MAP in the 70s. The patient is actually feeling better this AM. Per D/w Dr. Acie Fredrickson who will be seeing patient, we will leave this in critical care's hands to review to decide on initiation should further decompensation occur. However, given how critically sick he is, we suspect he will decompensate regardless of the measures undertaken. Beyond temporizing, there is not much else to offer at this point aside from supportive care which is being enacted.  BP remains too low for usual HF management and he is not felt to be a candidate for HD. I do not specifically see that primary team has undertaken measures to involve palliative medicine but I would strongly encourage these discussions be had as the patient is currently full code.  Of note, from AF standpoint, pt was on Eliquis as OP but on  heparin DVT ppx here. I will review definitive recs for anticoag with cardiologist.  For questions or updates, please contact [employer] Please consult [email or web address] for contact info under Cardiology/STEMI.  Signed, Charlie Pitter, PA-C 08/22/2018, 9:03 AM    Attending Note:   The patient was seen and examined.  Agree with assessment and plan as noted above.  Changes made to the above note as needed.  Patient seen and independently examined with Melina Copa, PA .   We discussed all aspects of the encounter. I agree with the assessment and plan as stated above.  1.  Shock :  Likely mixed septic shock and cardiogenic shock  BP is stable - but on pressors. Pt feels better We discussed the tough situation that we are in . Hopefully, his BP will recover some after several days of ABx and we can wean him off the Levo.  I think at some point, palliative medicine should be consulted.     I have spent a total of 40 minutes with patient reviewing hospital  notes , telemetry, EKGs, labs and examining patient as well as establishing an assessment and plan that was discussed with the patient. > 50% of time was spent in direct patient care.    Thayer Headings, Brooke Bonito., MD, [facility] 08/22/2018, 11:22 AM [address]  [place] Pager [phone]

## 2018-08-22 NOTE — Progress Notes (Signed)
CRITICAL VALUE ALERT  Critical Value: Potassium 7.1  Date & Time Notied:  5339 - 08/22/2018  Provider Notified: Silas [place]  Orders Received/Actions taken: No new orders given. Will continue to monitor.

## 2018-08-22 NOTE — Progress Notes (Signed)
NAME:  Cody Schwartz, MRN:  [id number], DOB:  [birth year], LOS: 2 ADMISSION DATE:  08/20/2018, CONSULTATION DATE: 08/13/2018 REFERRING MD:  Dr Darrick Meigs, CHIEF COMPLAINT: Sepsis  Brief History   Patient with a history of biventricular failure ejection fraction of 15% Pulmonary hypertension, liver cirrhosis,  History of present illness   Presented with generalized weakness, hypotension Known to have ascites Did have a paracentesis performed-removal of 5 L of yellow fluid Acute on chronic kidney disease  Past Medical History  Biventricular heart failure Ischemic cardia myopathy Coronary artery disease Liver cirrhosis Amyloid cardiomyopathy Chronic kidney disease Acute kidney injury  Significant Hospital Events   Paracentesis on 08/21/2018 for 5 L of fluid Persistent hypotension   Consults:  Cardiology Radiology Renal  Procedures:  Paracentesis 08/21/2018 Central line placement 08/21/2018  Significant Diagnostic Tests:   Micro Data:  Ascitic fluid 08/20/2018 Blood cultures 08/20/2018  Antimicrobials:  Zosyn 11/27>> Vancomycin  11/27 >>  Interim history/subjective:  Hemodynamics have stabilized He was on Levophed and Neo-Synephrine this morning, Neo-Synephrine weaned off  Objective   Blood pressure (!) 80/59, pulse (!) 121, temperature (!) 97.4 F (36.3 C), temperature source Oral, resp. rate 20, height 5\' 8"  (1.727 m), weight 71.6 kg, SpO2 97 %. CVP:  [0 mmHg-19 mmHg] 19 mmHg      Intake/Output Summary (Last 24 hours) at 08/22/2018 1302 Last data filed at 08/22/2018 1200 Gross per 24 hour  Intake 4381.26 ml  Output 939 ml  Net 3442.26 ml   Filed Weights   08/20/18 1351 08/22/18 0500  Weight: 78.6 kg 71.6 kg    Examination: General: Awake and alert, moving all extremities HENT: Moist oral mucosa Lungs: Poor air entry at the bases, no rales Cardiovascular: S1-S2 appreciated Abdomen: Soft, bowel sounds appreciated Extremities: Multiple skin  lesions Neuro: Alert and oriented x3, moving all extremities  Resolved Hospital Problem list    Assessment & Plan:  Sepsis -Be related to peritonitis -Blood cultures negative so far from 1127 -Continue to follow peritoneal fluid  Acute kidney injury -May be related to ongoing hypotension -Possible cardiorenal syndrome -Avoid nephrotoxic's  Chronic kidney disease -Likely related to cardiorenal syndrome  Pulmonary hypertension -Related to biventricular heart disease  Biventricular heart failure -Appears to be end-stage heart disease -Cardiology following -Cardiology opinion noted  Amyloid cardiomyopathy  Hypotension -Related to sepsis  SBP -We will continue antibiotics at the present time-day 2 of   Multiorgan failure -Cardiac dysfunction, kidney dysfunction, hepatic dysfunction  Liver cirrhosis  Input from cardiology noted Input from renal noted  Best practice:  Diet: Renal diet Pain/Anxiety/Delirium protocol (if indicated):  VAP protocol (if indicated): Not applicable DVT prophylaxis: Subcu heparin GI prophylaxis: Protonix Mobility: Bedrest Code Status: Full code Family Communication: Discussed with spouse at bedside this morning Disposition:   Labs   CBC: Recent Labs  Lab 08/20/18 1250 08/21/18 0340 08/22/18 0338  WBC 20.8* 17.8* 24.4*  NEUTROABS 19.5*  --   --   HGB 11.2* 10.8* 11.4*  HCT 34.4* 33.7* 35.6*  MCV 97.5 98.3 98.6  PLT 215 195 578    Basic Metabolic Panel: Recent Labs  Lab 08/20/18 1250 08/20/18 1633 08/20/18 1851 08/21/18 0340 08/22/18 0338 08/22/18 0600  NA 126*  --  128* 130* 127*  --   K 7.0* 6.9* 6.4* 6.6* 7.1* 4.8  CL 96*  --  104 103 98  --   CO2 15*  --  14* 15* 13*  --   GLUCOSE 111*  --  82 91 186*  --  BUN 94*  --  90* 87* 84*  --   CREATININE 3.37*  --  3.25* 3.17* 3.29*  --   CALCIUM 9.2  --  8.7* 8.8* 7.9*  --    GFR: Estimated Creatinine Clearance: 21.9 mL/min (A) (by C-G formula based on SCr of 3.29  mg/dL (H)). Recent Labs  Lab 08/20/18 1250  08/20/18 1633 08/20/18 1851 08/20/18 2248 08/21/18 0340 08/22/18 0338  WBC 20.8*  --   --   --   --  17.8* 24.4*  LATICACIDVEN  --    < > 2.0* 2.5* 2.0* 1.7  --    < > = values in this interval not displayed.    Liver Function Tests: Recent Labs  Lab 08/20/18 1303 08/21/18 0340 08/22/18 0338  AST 58* 43* 34  ALT 27 27 22   ALKPHOS 132* 113 105  BILITOT 1.9* 2.1* 2.7*  PROT 6.8 6.0* 5.7*  ALBUMIN 3.1* 2.8* 2.6*   No results for input(s): LIPASE, AMYLASE in the last 168 hours. Recent Labs  Lab 08/20/18 1633  AMMONIA 14    ABG    Component Value Date/Time   PHART 7.405 08/22/2018 0014   PCO2ART 21.3 (L) 08/22/2018 0014   PO2ART 80.7 (L) 08/22/2018 0014   HCO3 13.2 (L) 08/22/2018 0014   TCO2 19 (L) 01/10/2018 1338   ACIDBASEDEF 9.8 (H) 08/22/2018 0014   O2SAT 96.2 08/22/2018 0014     Coagulation Profile: No results for input(s): INR, PROTIME in the last 168 hours.  Cardiac Enzymes: No results for input(s): CKTOTAL, CKMB, CKMBINDEX, TROPONINI in the last 168 hours.  HbA1C: No results found for: HGBA1C  CBG: Recent Labs  Lab 08/20/18 1311  GLUCAP 107*    Review of Systems:   Review of Systems  Constitutional: Negative.   HENT: Negative.   Eyes: Negative.   Respiratory: Negative for shortness of breath.   Cardiovascular: Negative.  Negative for chest pain, claudication and leg swelling.  Gastrointestinal: Negative.   Genitourinary: Negative.   Skin: Positive for rash.     Past Medical History  He,  has a past medical history of Congestive heart failure (CHF) ([place]), Hyperlipidemia, and Hypokalemia.   Surgical History    Past Surgical History:  Procedure Laterality Date  . BIOPSY  08/05/2018   Procedure: BIOPSY;  Surgeon: Lavena Bullion, DO;  Location: [facility];  Service: Gastroenterology;;  . ESOPHAGOGASTRODUODENOSCOPY (EGD) WITH PROPOFOL N/A 08/05/2018   Procedure:  ESOPHAGOGASTRODUODENOSCOPY (EGD) WITH PROPOFOL;  Surgeon: Lavena Bullion, DO;  Location: [facility];  Service: Gastroenterology;  Laterality: N/A;  . liver biopsy  07/22/2018  . RIGHT/LEFT HEART CATH AND CORONARY ANGIOGRAPHY N/A 01/10/2018   Procedure: RIGHT/LEFT HEART CATH AND CORONARY ANGIOGRAPHY;  Surgeon: Jolaine Artist, MD;  Location: [facility];  Service: Cardiovascular;  Laterality: N/A;     Social History   reports that he has never smoked. He has never used smokeless tobacco. He reports that he does not drink alcohol or use drugs.   Family History   His family history includes CAD in his paternal grandfather and paternal grandmother; Stroke in his paternal grandfather. There is no history of Colon cancer.   Allergies Allergies  Allergen Reactions  . Spironolactone Itching     Home Medications  Prior to Admission medications   Medication Sig Start Date End Date Taking? Authorizing Provider  allopurinol (ZYLOPRIM) 300 MG tablet Take 300 mg by mouth daily. Take with 100 mg to equal 400 mg daily 03/09/17  Yes [provider]  apixaban (ELIQUIS) 5 MG TABS tablet Take 1 tablet (5 mg total) by mouth 2 (two) times daily. 01/02/18  Yes Bensimhon, Shaune Pascal, MD  cephALEXin (KEFLEX) 250 MG capsule Take 1 capsule (250 mg total) by mouth 4 (four) times daily. Take for five days 07/30/18  Yes Willia Craze, NP  eplerenone (INSPRA) 25 MG tablet Take 1 tablet (25 mg total) by mouth 2 (two) times daily. 06/11/18  Yes Bensimhon, Shaune Pascal, MD  levothyroxine (SYNTHROID, LEVOTHROID) 125 MCG tablet Take 125 mcg by mouth daily before breakfast.  11/05/17  Yes [provider]  metolazone (ZAROXOLYN) 5 MG tablet Take 1 tablet (5 mg total) by mouth 3 (three) times a week. Every Monday, Wed and Friday Patient taking differently: Take 5 mg by mouth every Monday, Wednesday, and Friday.  03/19/18 08/20/18 Yes Bensimhon, Shaune Pascal, MD  midodrine (PROAMATINE) 10 MG tablet Take 1  tablet (10 mg total) by mouth 3 (three) times daily with meals. Patient taking differently: Take 10 mg by mouth 2 (two) times daily with a meal.  06/11/18  Yes Bensimhon, Shaune Pascal, MD  pantoprazole (PROTONIX) 40 MG tablet Take 1 tablet (40 mg total) by mouth 2 (two) times daily. 08/06/18 10/01/18 Yes Cirigliano, Vito V, DO  Potassium Chloride 40 MEQ/15ML (20%) SOLN Take 22.5 mLs by mouth 3 (three) times daily AND 15 mLs 3 (three) times a week. MON, WED, FRI. Patient taking differently: Take 15 ml 3 times daily 04/24/18  Yes Larey Dresser, MD  pravastatin (PRAVACHOL) 40 MG tablet Take 1 tablet (40 mg total) by mouth daily. 03/29/17  Yes [name], MD  sodium bicarbonate 650 MG tablet Take 650 mg by mouth 2 (two) times daily. 08/01/18  Yes [provider]  torsemide (DEMADEX) 20 MG tablet Take 5 tabs in AM and 4 tabs in PM Patient taking differently: Take 80 mg by mouth every evening.  06/11/18  Yes Bensimhon, Shaune Pascal, MD  Vitamin D, Ergocalciferol, (DRISDOL) 50000 units CAPS capsule Take 50,000 Units by mouth every Monday.    Yes [provider]  nitroGLYCERIN (NITROSTAT) 0.4 MG SL tablet Place 1 tablet (0.4 mg total) under the tongue every 5 (five) minutes as needed for chest pain. 03/29/17   [name], MD     Critical care time: 60min

## 2018-08-22 NOTE — Progress Notes (Signed)
eLink Physician-Brief Progress Note Patient Name: Cody Schwartz DOB: March 16, 1956 MRN: [id number]   Date of Service  08/22/2018  HPI/Events of Note  Ongoing issues with hypotension despite max NE gtt.  Current BP readings in the 23N systolic with CVP of 12.  Ongoing AF with HR in the 130s.  eICU Interventions  1. Addition of NEO gtt for BP support 2. Once this is on attempt to reduce NE gtt as it may be contributing to tachycardia.  The midodrine may also be contributing as well 3. Insert aline for more accurate monitoring of BP     Intervention Category Major Interventions: Hypotension - evaluation and management  DETERDING,ELIZABETH 08/22/2018, 3:08 AM

## 2018-08-22 NOTE — Progress Notes (Signed)
Pharmacy Antibiotic Note  Cody Schwartz is a 62 y.o. male admitted on 08/20/2018 with sepsis and AKI, hyperkalemia.  Pharmacy has been consulted for Zosyn and Vancomycin dosing.  Today, 08/22/18:   Day 3 Abxs  SCr 3.29 up CrCl 22  WBC 24.4 up  Tmin 97  Plan:  Vanc 1g x 2 given with last dose at 2038 on 11/28, will start vanc 1g IV q48 based on current renal function and vanc level on 11/28 - goal AUC 400-500  Order another Vanc random level and SCr in AM 11/30 to see if vanc dose/freq needs adjusted, etc  Height: 5\' 8"  (172.7 cm) Weight: 157 lb 13.6 oz (71.6 kg) IBW/kg (Calculated) : 68.4  Temp (24hrs), Avg:97.8 F (36.6 C), Min:97 F (36.1 C), Max:98.6 F (37 C)  Recent Labs  Lab 08/15/18 1041 08/20/18 1250 08/20/18 1352 08/20/18 1633 08/20/18 1851 08/20/18 2248 08/21/18 0340 08/21/18 1843 08/22/18 0338  WBC  --  20.8*  --   --   --   --  17.8*  --  24.4*  CREATININE 2.29* 3.37*  --   --  3.25*  --  3.17*  --  3.29*  LATICACIDVEN  --   --  2.13* 2.0* 2.5* 2.0* 1.7  --   --   VANCORANDOM  --   --   --   --   --   --   --  10  --     Estimated Creatinine Clearance: 21.9 mL/min (A) (by C-G formula based on SCr of 3.29 mg/dL (H)).    Allergies  Allergen Reactions  . Spironolactone Itching    Antimicrobials this admission:  11/27 Vancomycin >> 11/27 Zosyn >>  Microbiology results:  11/27 BCx: NG to date 11/27 MRSA PCR: negative 11/28 Peritoneal fluid: ngtd   Thank you for allowing pharmacy to be a part of this patient's care.  Adrian Saran, PharmD, BCPS 08/22/2018 9:31 AM

## 2018-08-22 NOTE — Progress Notes (Signed)
[facility] Progress Note  Subjective:  K+ 7.1 then repeat was 4.8.  Cr up 3.29, UOP poor, BP low earlier but now 80's. One pressor weaned off still getting levo gtt.   Vitals:   08/22/18 1200 08/22/18 1215 08/22/18 1230 08/22/18 1245  BP:      Pulse:    (!) 121  Resp: (!) 21 19 20 20   Temp: (!) 97.3 F (36.3 C)     TempSrc: Oral     SpO2:    97%  Weight:      Height:        Inpatient medications: . allopurinol  300 mg Oral Daily  . Chlorhexidine Gluconate Cloth  6 each Topical Daily  . collagenase   Topical Daily  . heparin injection (subcutaneous)  5,000 Units Subcutaneous Q8H  . levothyroxine  125 mcg Oral Q0600  . mouth rinse  15 mL Mouth Rinse BID  . midodrine  10 mg Oral TID WC  . pantoprazole  40 mg Oral BID  . pravastatin  40 mg Oral q1800  . sodium bicarbonate  650 mg Oral BID  . sodium chloride flush  10-40 mL Intracatheter Q12H  . sodium zirconium cyclosilicate  10 g Oral TID  . vancomycin variable dose per unstable renal function (pharmacist dosing)   Does not apply See admin instructions   . sodium chloride 10 mL/hr at 08/22/18 1200  . norepinephrine (LEVOPHED) Adult infusion 40 mcg/min (08/22/18 1220)  . phenylephrine (NEO-SYNEPHRINE) Adult infusion Stopped (08/22/18 0759)  . piperacillin-tazobactam (ZOSYN)  IV 12.5 mL/hr at 08/22/18 1200  .  sodium bicarbonate (isotonic) infusion in sterile water 65 mL/hr at 08/22/18 1200  . [START ON 08/23/2018] vancomycin     nitroGLYCERIN, ondansetron **OR** ondansetron (ZOFRAN) IV, senna-docusate, sodium chloride flush, traMADol  Iron/TIBC/Ferritin/ %Sat    Component Value Date/Time   IRON 47 (L) 08/15/2018 1041   TIBC 234 (L) 08/15/2018 1041   FERRITIN 543 (H) 08/15/2018 1041   IRONPCTSAT 20 08/15/2018 1041    Exam: Gen chronically ill appearing, alert, dry mouth No rash, cyanosis or gangrene Sclera anicteric, throat clear No jvd or bruits Chest clear bilat to bases, no rales or wheezing RRR  no MRG Abd protuberant, nontender, 3+ ascites, +BS GU normal male MS no joint effusions or deformity Ext 2+ chronic pitting dependent thigh edema, no wounds or ulcers Neuro is alert, Ox 3 , nf, no asterixis    Home meds:  - apixaban 5 bid/  pravastatin 40 qd  - epleronone 25 bid/ metolazone 5 mg mwf/ KCL 15 ml tid (40 mqd/ 39ml)/ torsemide 80 hs  - pantoprazole 40 bid/ levothyroxine 125 ug/ allopurinol 300 qd/ vitamins/ prns  - midodrine 10 bid/ sl ntg prn   CXR - no edema, CM  ECHO 5/19 - LVEF 15%, diffuse hypoK, mod MR, mod decreased RV fxn, biatrial enlargement   Impression/ Plan: 1. AKI on CKD 3 - from hypotension and relative vol depletion/ paracentesis today. Underlying cardiorenal syndrome. Medical Rx.  Decrease IVF to 50 cc/hr.  2. Shock - cardiogenic + septic 3. Possible SBP - +8000 cell ct on peritoneal fluid sample, on IV abx 4. Hyperkalemia - K+ better, decrease Lokelma 10gm daily. Cont renal diet.   5. Cirrhosis - w/ ascites and sp 5L paracentesis this am 6. Severe CM EF 15% 7. Hyponatremia - Na+ down 127 8. Atrial fib, chronic - per cardiology 9. Amyloid cardiomyopathy    Kelly Splinter MD [facility] pager [phone]  08/22/2018, 3:05 PM   Recent Labs  Lab 08/21/18 0340 08/22/18 0338 08/22/18 0600  NA 130* 127*  --   K 6.6* 7.1* 4.8  CL 103 98  --   CO2 15* 13*  --   GLUCOSE 91 186*  --   BUN 87* 84*  --   CREATININE 3.17* 3.29*  --   CALCIUM 8.8* 7.9*  --   ALBUMIN 2.8* 2.6*  --    Recent Labs  Lab 08/21/18 0340 08/22/18 0338  AST 43* 34  ALT 27 22  ALKPHOS 113 105  BILITOT 2.1* 2.7*  PROT 6.0* 5.7*   Recent Labs  Lab 08/20/18 1250 08/21/18 0340 08/22/18 0338  WBC 20.8* 17.8* 24.4*  NEUTROABS 19.5*  --   --   HGB 11.2* 10.8* 11.4*  HCT 34.4* 33.7* 35.6*  MCV 97.5 98.3 98.6  PLT 215 195 188

## 2018-08-22 NOTE — Anesthesia Procedure Notes (Signed)
Arterial Line Insertion Start/End11/29/2019 6:25 AM, 08/22/2018 6:40 AM Performed by: Montel Clock, CRNA  Patient location: ICU. Preanesthetic checklist: patient identified, IV checked, risks and benefits discussed, surgical consent, monitors and equipment checked and timeout performed Lidocaine 1% used for infiltration Left, radial was placed Catheter size: 20 G Hand hygiene performed , maximum sterile barriers used  and Seldinger technique used Allen's test indicative of satisfactory collateral circulation Attempts: 2 Procedure performed without using ultrasound guided technique. Following insertion, dressing applied and Biopatch. Post procedure assessment: unchanged  Patient tolerated the procedure well with no immediate complications.

## 2018-08-23 DIAGNOSIS — Z7189 Other specified counseling: Secondary | ICD-10-CM

## 2018-08-23 DIAGNOSIS — R570 Cardiogenic shock: Secondary | ICD-10-CM

## 2018-08-23 DIAGNOSIS — Z515 Encounter for palliative care: Secondary | ICD-10-CM

## 2018-08-23 DIAGNOSIS — E7800 Pure hypercholesterolemia, unspecified: Secondary | ICD-10-CM

## 2018-08-23 LAB — BASIC METABOLIC PANEL
Anion gap: 16 — ABNORMAL HIGH (ref 5–15)
BUN: 71 mg/dL — ABNORMAL HIGH (ref 8–23)
CO2: 19 mmol/L — ABNORMAL LOW (ref 22–32)
Calcium: 7.6 mg/dL — ABNORMAL LOW (ref 8.9–10.3)
Chloride: 94 mmol/L — ABNORMAL LOW (ref 98–111)
Creatinine, Ser: 2.8 mg/dL — ABNORMAL HIGH (ref 0.61–1.24)
GFR calc Af Amer: 26 mL/min — ABNORMAL LOW (ref >60)
GFR calc non Af Amer: 23 mL/min — ABNORMAL LOW (ref >60)
Glucose, Bld: 165 mg/dL — ABNORMAL HIGH (ref 70–99)
Potassium: 4.1 mmol/L (ref 3.5–5.1)
Sodium: 129 mmol/L — ABNORMAL LOW (ref 135–145)

## 2018-08-23 MED ORDER — PANTOPRAZOLE SODIUM 40 MG IV SOLR
40.0000 mg | Freq: Two times a day (BID) | INTRAVENOUS | Status: DC
  Administered 2018-08-23 – 2018-08-27 (×8): 40 mg via INTRAVENOUS
  Filled 2018-08-23 (×8): qty 40

## 2018-08-23 MED ORDER — SODIUM CHLORIDE 0.9 % IV SOLN: INTRAVENOUS | Status: DC | PRN

## 2018-08-23 MED ORDER — DIGOXIN 0.25 MG/ML IJ SOLN
0.2500 mg | Freq: Once | INTRAMUSCULAR | Status: AC
  Administered 2018-08-23: 0.25 mg via INTRAVENOUS
  Filled 2018-08-23: qty 1

## 2018-08-23 MED ORDER — PROMETHAZINE HCL 25 MG/ML IJ SOLN
25.0000 mg | Freq: Once | INTRAMUSCULAR | Status: AC
  Administered 2018-08-23: 25 mg via INTRAVENOUS
  Filled 2018-08-23: qty 1

## 2018-08-23 MED ORDER — LINEZOLID 600 MG/300ML IV SOLN
600.0000 mg | Freq: Two times a day (BID) | INTRAVENOUS | Status: DC
  Administered 2018-08-23 – 2018-08-25 (×5): 600 mg via INTRAVENOUS
  Filled 2018-08-23 (×5): qty 300

## 2018-08-23 NOTE — Progress Notes (Signed)
Progress Note  Patient Name: Cody Schwartz Date of Encounter: 08/23/2018  Primary Cardiologist: Shirlee More, MD   Subjective   No significant chest pain.  Mild improvement.  Still hypotensive.  Inpatient Medications    Scheduled Meds: . allopurinol  300 mg Oral Daily  . Chlorhexidine Gluconate Cloth  6 each Topical Daily  . collagenase   Topical Daily  . heparin injection (subcutaneous)  5,000 Units Subcutaneous Q8H  . levothyroxine  125 mcg Oral Q0600  . linezolid  600 mg Oral Q12H  . mouth rinse  15 mL Mouth Rinse BID  . midodrine  10 mg Oral TID WC  . pantoprazole  40 mg Oral BID  . pravastatin  40 mg Oral q1800  . sodium bicarbonate  650 mg Oral BID  . sodium chloride flush  10-40 mL Intracatheter Q12H  . sodium zirconium cyclosilicate  10 g Oral Daily   Continuous Infusions: . sodium chloride 10 mL/hr at 08/23/18 0800  . norepinephrine (LEVOPHED) Adult infusion 40 mcg/min (08/23/18 0800)  . phenylephrine (NEO-SYNEPHRINE) Adult infusion Stopped (08/22/18 0759)  . piperacillin-tazobactam (ZOSYN)  IV Stopped (08/23/18 0707)  .  sodium bicarbonate (isotonic) infusion in sterile water 50 mL/hr at 08/23/18 0800   PRN Meds: nitroGLYCERIN, ondansetron **OR** ondansetron (ZOFRAN) IV, senna-docusate, sodium chloride flush, traMADol   Vital Signs    Vitals:   08/23/18 0800 08/23/18 0815 08/23/18 0830 08/23/18 0845  BP:      Pulse:      Resp: 16 18 16  (!) 21  Temp: (!) 97.5 F (36.4 C)     TempSrc: Oral     SpO2:      Weight:      Height:        Intake/Output Summary (Last 24 hours) at 08/23/2018 0926 Last data filed at 08/23/2018 0800 Gross per 24 hour  Intake 3273.77 ml  Output 685 ml  Net 2588.77 ml   Filed Weights   08/20/18 1351 08/22/18 0500 08/23/18 0500  Weight: 78.6 kg 71.6 kg 73.7 kg    Telemetry    Atrial fibrillation heart rate between 120 and 130- Personally Reviewed  ECG    Atrial fibrillation with RVR- Personally  Reviewed  Physical Exam   GEN: No acute distress.  Ill-appearing, jaundice Neck: No JVD Cardiac:  Irregularly irregular tachycardic, no murmurs, rubs, or gallops.  Respiratory: Coarse bilaterally. GI: Soft, nontender, non-distended  MS: No edema; No deformity. Neuro:  Nonfocal  Psych: Normal affect   Labs    Chemistry Recent Labs  Lab 08/20/18 1303  08/21/18 0340 08/22/18 0338 08/22/18 0600 08/23/18 0830  NA  --    < > 130* 127*  --  129*  K  --    < > 6.6* 7.1* 4.8 4.1  CL  --    < > 103 98  --  94*  CO2  --    < > 15* 13*  --  19*  GLUCOSE  --    < > 91 186*  --  165*  BUN  --    < > 87* 84*  --  71*  CREATININE  --    < > 3.17* 3.29*  --  2.80*  CALCIUM  --    < > 8.8* 7.9*  --  7.6*  PROT 6.8  --  6.0* 5.7*  --   --   ALBUMIN 3.1*  --  2.8* 2.6*  --   --   AST 58*  --  43*  34  --   --   ALT 27  --  27 22  --   --   ALKPHOS 132*  --  113 105  --   --   BILITOT 1.9*  --  2.1* 2.7*  --   --   GFRNONAA  --    < > 20* 19*  --  23*  GFRAA  --    < > 23* 22*  --  26*  ANIONGAP  --    < > 12 16*  --  16*   < > = values in this interval not displayed.     Hematology Recent Labs  Lab 08/20/18 1250 08/21/18 0340 08/22/18 0338  WBC 20.8* 17.8* 24.4*  RBC 3.53* 3.43* 3.61*  HGB 11.2* 10.8* 11.4*  HCT 34.4* 33.7* 35.6*  MCV 97.5 98.3 98.6  MCH 31.7 31.5 31.6  MCHC 32.6 32.0 32.0  RDW 15.2 15.3 15.7*  PLT 215 195 188    Cardiac EnzymesNo results for input(s): TROPONINI in the last 168 hours.  Recent Labs  Lab 08/20/18 1349  TROPIPOC 0.02     BNP Recent Labs  Lab 08/20/18 1303  BNP 1,090.4*     DDimer No results for input(s): DDIMER in the last 168 hours.   Radiology    Dg Chest 1 View  Result Date: 08/21/2018 CLINICAL DATA:  Status post central line placement EXAM: CHEST  1 VIEW COMPARISON:  08/20/2018 FINDINGS: Cardiac shadow is accentuated by the portable technique. Right jugular central line is now seen in the mid right atrium. No pneumothorax  is noted. Minimal left basilar atelectasis is noted. IMPRESSION: No pneumothorax following central line placement New minimal left basilar atelectasis. This may be related to a poor inspiratory effort. Electronically Signed   By: Inez Catalina M.D.   On: 08/21/2018 14:41   US Paracentesis  Result Date: 08/21/2018 INDICATION: 62 year old with recurrent ascites and history of CHF. Patient was recently admitted to the hospital for sepsis and hyperkalemia. Plan for ultrasound-guided paracentesis with a maximum volume of 5 L. EXAM: ULTRASOUND GUIDED PARACENTESIS MEDICATIONS: None. COMPLICATIONS: None immediate. PROCEDURE: Informed written consent was obtained from the patient after a discussion of the risks, benefits and alternatives to treatment. A timeout was performed prior to the initiation of the procedure. Initial ultrasound scanning demonstrates a large amount of ascites within the left lower abdominal quadrant. The left lower abdomen was prepped and draped in the usual sterile fashion. 1% lidocaine with was used for local anesthesia. Following this, a 6 Fr Safe-T-Centesis catheter was introduced. An ultrasound image was saved for documentation purposes. The paracentesis was performed. The catheter was removed and a dressing was applied. The patient tolerated the procedure well without immediate post procedural complication. FINDINGS: A total of approximately 5 L of cloudy yellow fluid was removed. Samples were sent to the laboratory as requested by the clinical team. IMPRESSION: Successful ultrasound-guided paracentesis yielding 5 liters of peritoneal fluid. Electronically Signed   By: Markus Daft M.D.   On: 08/21/2018 10:30    Cardiac Studies   Echocardiogram 01/22/2018:  - Moderately dilated LV with EF 15%, diffuse hypokinesis. Mildly   dilated RV with moderately decreased systolic function. Biatrial   enlargement. Moderate functional mitral regurgitation. Mild   pulmonary hypertension. Dilated IVC  suggestive of elevated RV   filling pressure.  Cardiac catheterization right and left 01/10/2018:  Mid RCA lesion is 30% stenosed.  Previously placed Mid RCA to Dist RCA stent (unknown type) is widely  patent.  Previously placed Ost 2nd Mrg to 2nd Mrg stent (unknown type) is widely patent.  Prox Cx lesion is 30% stenosed.  Ost 1st Mrg to 1st Mrg lesion is 40% stenosed.  Previously placed Prox LAD to Mid LAD stent (unknown type) is widely patent.  Prox LAD lesion is 65% stenosed.  Mid LAD lesion is 30% stenosed.  Previously placed Mid LAD to Dist LAD stent (unknown type) is widely patent.  Dist LAD lesion is 100% stenosed.   Findings:  Ao = 82/59 (69) LV = 87/17 RA =  13 RV = 46/13 PA = 48/23 (33) PCW = 21 Fick cardiac output/index = 3.2/1.7 PVR = 3.9 WU Aosat = 96% PA sat = 51%, 51%  Assessment: 1. 3v CAD with patent stents (LAD x 2), OM2, RCA 2. There is a 60-70% lesion int the mid LAD prior to previously placed stent. This is not flow-limiting. The apical LAD is occluded chronically 3. EF 25% by ECHO 4. Mild to moderately elevated filling pressures with low cardiac output  Patient Profile     62 y.o. male with severe biventricular heart failure due to mixed ICM/NICM EF 17%, PYP scan c/w amyloidosis (started on tafamadis, genetic testing negative for TTR), CAD s/p PCI (last in 2017), CKD, cirrhosis with ascites requiring frequent paracenteses, moderate pulm HTN, atrial fib (dx 12/2017), bilateral leg wounds. He was admitted with setting of several days of weakness, poor PO intake, hyperkalemia. He was found to have sepsis possibly due to chronic leg wounds with acute worsening vs SBP, AKI with hyperkalemia, and worsening heart failure. Renal, ID, PCCM on board.  Assessment & Plan    Mixture of sepsis/cardiogenic shock with severe biventricular heart failure and acute on chronic hypotension - Currently on pressors.  Hopefully as he continues to improve after  antibiotics we can possibly get him off of the norepinephrine.  This may not happen however.  Agree with palliative care consultation as previously noted. Prognosis at this point seems very poor.  Certainly he is end-stage/stage D heart failure.  EF 15%.  His blood pressure at baseline has been in the upper 80s he states.  My current suggestion is to slowly wean down the norepinephrine understanding that his baseline systolics may be in the upper 80s.  Decreased beta agonist would also help with his atrial fibrillation.  Atrial fibrillation with rapid ventricular response - As norepinephrine is decreased, hopefully atrial fibrillation will improve as well.  Not on anticoagulation because of bleeding risks  Acute kidney injury - Likely in part cardiorenal from hypotension  Cirrhosis with possible SBP -5 L paracentesis -Antibiotics  Multisystem organ failure -Poor prognosis, had lengthy discussion with him today.  He has talked with his wife about DNR.  He mentioned to me that if he were to go on a ventilator may be he would be on it for 10 days or so and if he did not make a recovery, then stop and then.  I explained to him that with his underlying cardiomyopathy, liver failure, kidney injury and if he were to have a lethal arrhythmia that his chances of returning to a meaningful existence even with ventilator were very low.  He wants to be here as long as he can for his family.  He also states that he is a [place] and understands that when it is time at this time.   Critical care time 40 minutes spent with patient extensive data review in this gentleman with severe multisystem organ failure.  For questions or updates, please contact [place] Please consult [email or web address] for contact info under        Signed, Candee Furbish, MD  08/23/2018, 9:26 AM

## 2018-08-23 NOTE — Consult Note (Signed)
Consultation Note Date: 08/23/2018   Patient Name: Cody Schwartz  DOB: 1956-08-15  MRN: [id number]  Age / Sex: 62 y.o., male  PCP: [name]., MD Referring Physician: Laurin Coder, MD  Reason for Consultation: Establishing goals of care  HPI/Patient Profile: 61 y.o. male admitted on 08/20/2018 from home with complaints of generalized weakness. He has a past medical history significant for biventricular CHF (EF 15%), ischemic cardia myopathy, coronary artery disease A/P PCI with DES (09/2015), pulmonary hypertension, cirrhosis, hyperlipidemia, atrial fibrillation, hypotension, GERD, and diabetes.  Patient presented to ED with increased weakness for 3 days prior.  During his ED course he reports diminishing strength in his lower extremities.  Patient also endorses decreased appetite.  He continues to get paracentesis approximately every 4 to 5 weeks with his last one completed on 08/08/18 which yielded 10 L per patient.  Patient was afebrile, respirations 23, tachycardic, hypotensive (72/45), lactic acid 2.13, troponin 0 0.02, alk phos 132, total bili 1.9, BNP 1090.4, WBC 20.8, hemoglobin 11, BUN 94, creatinine 3.37, potassium 7.0, glucose 111, albumin 3.1.  Since admission patient continues to have generalized weakness and requiring Levophed and neo-for blood pressure support.  Patient is being followed by nephrology, infectious disease, and cardiology.  Palliative medicine team consulted for goals of care discussion.  Clinical Assessment and Goals of Care: I have reviewed medical records including lab results, imaging, Epic notes, and MAR, received report from the bedside RN, and assessed the patient. I then met at the bedside with patient  to discuss diagnosis prognosis, GOC, EOL wishes, disposition and options.  Patient is alert and oriented x3.  Offered to have goals of care discussion with wife present  however patient states wife is taking care of home business and unable to be present at this time.  Patient expressed to continue with goals of care discussion and that he is already planning to have a discussion with his wife amongst themselves and will discuss anything that we discussed discuss with her on his own.  I introduced Palliative Medicine as specialized medical care for people living with serious illness. It focuses on providing relief from the symptoms and stress of a serious illness. The goal is to improve quality of life for both the patient and the family.  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  He states he has 3 daughters and 8 grandchildren.  He reports he has been married to his wife for 45 years.  As far as functional and nutritional status patient states over the past 6 to 12 months he has struggled with his health.  He states noticing more of a decline over the past 3 to 6 months.  He becomes somewhat tearful and states he has never been this he despite his continuous health struggles.  Patient states he was previously ambulating with a cane however due to increased weakness he  has required more assistance in the home with ADLs and transfers.  He states he has poor appetite due to alterations in taste and continuous feelings of fullness.  He reports intermittent shortness of breath depending on the severity of his recurrent ascites.  I attempted to discuss patient's current illness and what it means in the larger context of his on-going co-morbidities.  Natural disease trajectory and expectations at EOL were discussed.  Patient politely stated " I am not interested in talking about my current condition or comorbidities I think is a healthcare provider you are aware of was going on and so am I" he states that he knows that his condition is worsening and he knows  that it is going to be hard for him to recover with everything that is going on and he is okay with that because he is a [place] and he knows where he is going.  I attempted to elicit values and goals of care important to the patient.    The difference between aggressive medical intervention and comfort care was considered in light of the patient's goals of care.  At this time patient is aware that he may not have many more options left when it comes to his care, however he would like to continue with all current treatments at this time. He states he is not prepared to shift his care to comfort.   Patient continues to express during conversations "when it is my time to go, my heavenly father will take me and I will be at peace!"   I attempted to ask patient how his wife and daughters felt about his current illness and the severity of his condition. Patient again became withdrawn and stated he did not want to talk about it but they are aware and dealing with it.   I discussed patient's current full code status with consideration to his current illness and comorbidities. I discuss specifically his cirrhosis, kidney failure, and cardiac status. I educated patient that his chances of meaningful recovery or return to at least his current quality of life is not likely and his family would most likely have to make a decision to transition his care to comfort and/or be faced with other difficult decisions. Patient verbalized understanding and again stated he is ready when "God calls him home". I challenged patient to think of his statement and how that is related to his code status knowing in the event his heart stopped or he stopped breathing with medical staff performing heroic measures, that would be intervening with a natural death process and/or God's plan for him. Patient expressed he had not considered that and would like to have a discussion with his wife before changing to DNR/DNI. He expressed he wants  her and his children to be comfortable with any decisions. Support provided.   I attempted to discuss hospice and palliative care services. Patient sated he did not want to discuss further. Advised that I would recommend palliative at minimum for support if patient discharges from hospital to home. He advised he would discuss with his wife and if they are in agreement he would notify one of the providers.   Questions and concerns were addressed.The family was encouraged to call with questions or concerns.  PMT will continue to support holistically.  Primary Decision Maker: PATIENT    SUMMARY OF RECOMMENDATIONS    Full Code-patient is considering DNR/DNI. Requesting to have a discussion with wife regarding her support and wishes.   Continue to  treat the treatable with all available medical interventions.   Patient is not accepting of hospice or palliative at this time. States he would like to discuss further with his wife.   Patient continues to have intermittent episodes of nausea. Unable to take po Protonix. Order given to change to IV for GI support. Continue with IV Zofran.  Palliative Medicine team will continue to support patient, patient's family, and medical team as needed. Please call.   Code Status/Advance Care Planning:  Full code   Symptom Management:   IV Protonix   IV Zofran for nausea  Palliative Prophylaxis:   Aspiration, Bowel Regimen, Delirium Protocol, Frequent Pain Assessment and Turn Reposition  Additional Recommendations (Limitations, Scope, Preferences):  Full Scope Treatment  Psycho-social/Spiritual:   Desire for further Chaplaincy support:NO  Additional Recommendations: Caregiving  Support/Resources and Education on Hospice  Prognosis:   Poor in the setting of decreased mobility, Sepsis, hyperkalemia, CKD, CAD, chronic systolic heart failure (endstage), EF 15%, cirrhosis, reoccuring ascites requiring paracentesis every month, poor po intake,  generalized weakness.   Discharge Planning: To Be Determined      Primary Diagnoses: Present on Admission: . Sepsis ([place]) . Hyperkalemia, diminished renal excretion . CKD (chronic kidney disease) . Chronic systolic heart failure ([place]) . CAD (coronary artery disease), native coronary artery . Dilated cardiomyopathy ([place]) . Right heart failure (secondary to left heart failure) ([place]) . Ascites . Hyponatremia . Hyperlipidemia . Atrial fibrillation, chronic   I have reviewed the medical record, interviewed the patient and family, and examined the patient. The following aspects are pertinent.  Past Medical History:  Diagnosis Date  . Congestive heart failure (CHF) ([place])   . Hyperlipidemia   . Hypokalemia    Social History   Socioeconomic History  . Marital status: Married    Spouse name: Not on file  . Number of children: Not on file  . Years of education: Not on file  . Highest education level: Not on file  Occupational History  . Not on file  Social Needs  . Financial resource strain: Not on file  . Food insecurity:    Worry: Not on file    Inability: Not on file  . Transportation needs:    Medical: Not on file    Non-medical: Not on file  Tobacco Use  . Smoking status: Never Smoker  . Smokeless tobacco: Never Used  Substance and Sexual Activity  . Alcohol use: No    Frequency: Never  . Drug use: Never  . Sexual activity: Not on file  Lifestyle  . Physical activity:    Days per week: Not on file    Minutes per session: Not on file  . Stress: Not on file  Relationships  . Social connections:    Talks on phone: Not on file    Gets together: Not on file    Attends religious service: Not on file    Active member of club or organization: Not on file    Attends meetings of clubs or organizations: Not on file    Relationship status: Not on file  Other Topics Concern  . Not on file  Social History Narrative  . Not on file   Family History  Problem  Relation Age of Onset  . CAD Paternal Grandmother   . Stroke Paternal Grandfather   . CAD Paternal Grandfather   . Colon cancer Neg Hx    Scheduled Meds: . allopurinol  300 mg Oral Daily  . Chlorhexidine Gluconate Cloth  6 each Topical Daily  . collagenase   Topical Daily  . heparin injection (subcutaneous)  5,000 Units Subcutaneous Q8H  . levothyroxine  125 mcg Oral Q0600  . mouth rinse  15 mL Mouth Rinse BID  . midodrine  10 mg Oral TID WC  . pantoprazole (PROTONIX) IV  40 mg Intravenous Q12H  . pravastatin  40 mg Oral q1800  . sodium bicarbonate  650 mg Oral BID  . sodium chloride flush  10-40 mL Intracatheter Q12H  . sodium zirconium cyclosilicate  10 g Oral Daily   Continuous Infusions: . sodium chloride Stopped (08/23/18 1224)  . sodium chloride    . linezolid (ZYVOX) IV Stopped (08/23/18 1317)  . norepinephrine (LEVOPHED) Adult infusion 40 mcg/min (08/23/18 1700)  . phenylephrine (NEO-SYNEPHRINE) Adult infusion Stopped (08/22/18 0759)  . piperacillin-tazobactam (ZOSYN)  IV Stopped (08/23/18 1218)  .  sodium bicarbonate (isotonic) infusion in sterile water 50 mL/hr at 08/23/18 1700   PRN Meds:.sodium chloride, nitroGLYCERIN, ondansetron **OR** ondansetron (ZOFRAN) IV, senna-docusate, sodium chloride flush, traMADol Medications Prior to Admission:  Prior to Admission medications   Medication Sig Start Date End Date Taking? Authorizing Provider  allopurinol (ZYLOPRIM) 300 MG tablet Take 300 mg by mouth daily. Take with 100 mg to equal 400 mg daily 03/09/17  Yes [provider]  apixaban (ELIQUIS) 5 MG TABS tablet Take 1 tablet (5 mg total) by mouth 2 (two) times daily. 01/02/18  Yes Bensimhon, Shaune Pascal, MD  cephALEXin (KEFLEX) 250 MG capsule Take 1 capsule (250 mg total) by mouth 4 (four) times daily. Take for five days 07/30/18  Yes Willia Craze, NP  eplerenone (INSPRA) 25 MG tablet Take 1 tablet (25 mg total) by mouth 2 (two) times daily. 06/11/18  Yes Bensimhon,  Shaune Pascal, MD  levothyroxine (SYNTHROID, LEVOTHROID) 125 MCG tablet Take 125 mcg by mouth daily before breakfast.  11/05/17  Yes [provider]  metolazone (ZAROXOLYN) 5 MG tablet Take 1 tablet (5 mg total) by mouth 3 (three) times a week. Every Monday, Wed and Friday Patient taking differently: Take 5 mg by mouth every Monday, Wednesday, and Friday.  03/19/18 08/20/18 Yes Bensimhon, Shaune Pascal, MD  midodrine (PROAMATINE) 10 MG tablet Take 1 tablet (10 mg total) by mouth 3 (three) times daily with meals. Patient taking differently: Take 10 mg by mouth 2 (two) times daily with a meal.  06/11/18  Yes Bensimhon, Shaune Pascal, MD  pantoprazole (PROTONIX) 40 MG tablet Take 1 tablet (40 mg total) by mouth 2 (two) times daily. 08/06/18 10/01/18 Yes Cirigliano, Vito V, DO  Potassium Chloride 40 MEQ/15ML (20%) SOLN Take 22.5 mLs by mouth 3 (three) times daily AND 15 mLs 3 (three) times a week. MON, WED, FRI. Patient taking differently: Take 15 ml 3 times daily 04/24/18  Yes Larey Dresser, MD  pravastatin (PRAVACHOL) 40 MG tablet Take 1 tablet (40 mg total) by mouth daily. 03/29/17  Yes [name], MD  sodium bicarbonate 650 MG tablet Take 650 mg by mouth 2 (two) times daily. 08/01/18  Yes [provider]  torsemide (DEMADEX) 20 MG tablet Take 5 tabs in AM and 4 tabs in PM Patient taking differently: Take 80 mg by mouth every evening.  06/11/18  Yes Bensimhon, Shaune Pascal, MD  Vitamin D, Ergocalciferol, (DRISDOL) 50000 units CAPS capsule Take 50,000 Units by mouth every Monday.    Yes [provider]  nitroGLYCERIN (NITROSTAT) 0.4 MG SL tablet Place 1 tablet (0.4 mg total) under the tongue  every 5 (five) minutes as needed for chest pain. 03/29/17   [name], MD   Allergies  Allergen Reactions  . Spironolactone Itching   Review of Systems  Constitutional: Positive for activity change, appetite change, fatigue and unexpected weight change.  Respiratory: Positive for shortness of breath.    Gastrointestinal: Positive for abdominal distention and abdominal pain.  Musculoskeletal: Positive for back pain.  Neurological: Positive for weakness.  All other systems reviewed and are negative.   Physical Exam  Constitutional: He is oriented to person, place, and time. He is cooperative. He appears ill.  Thin, frail, chronically ill appearing, hypotensive   Eyes:  Jaundice   Cardiovascular: Normal heart sounds. An irregular rhythm present. Exam reveals decreased pulses.  Bilateral lower extremity redness,   Pulmonary/Chest: He has decreased breath sounds.  Some shortness of breath   Abdominal: He exhibits distension and ascites. Bowel sounds are decreased.  Musculoskeletal:  weakness  Neurological: He is alert and oriented to person, place, and time.  Skin: Skin is warm and dry. Bruising and ecchymosis noted.  Psychiatric: He has a normal mood and affect. His speech is normal and behavior is normal. Judgment normal. Cognition and memory are normal.  Nursing note and vitals reviewed.   Vital Signs: BP (!) 82/53   Pulse (!) 111   Temp (!) 96.8 F (36 C) (Axillary) Comment: RN in room at time of temp, unable to get oral  Resp 18   Ht '5\' 8"'  (1.727 m)   Wt 73.7 kg   SpO2 94%   BMI 24.70 kg/m  Pain Scale: 0-10   Pain Score: 0-No pain   SpO2: SpO2: 94 % O2 Device:SpO2: 94 % O2 Flow Rate: .O2 Flow Rate (L/min): 2 L/min  IO: Intake/output summary:   Intake/Output Summary (Last 24 hours) at 08/23/2018 1753 Last data filed at 08/23/2018 1700 Gross per 24 hour  Intake 3158.1 ml  Output 495 ml  Net 2663.1 ml    LBM: Last BM Date: 08/18/18 Baseline Weight: Weight: 78.6 kg Most recent weight: Weight: 73.7 kg     Palliative Assessment/Data: PPS 30%      Time In: 1230 Time Out: 1335 Time Total: 65 min  Greater than 50%  of this time was spent counseling and coordinating care related to the above assessment and plan.  Signed by:  Alda Lea,  AGPCNP-BC Palliative Medicine Team  Phone: [phone] Fax: [phone] Pager: [phone] Amion: Bjorn Pippin    Please contact Palliative Medicine Team phone at [phone] for questions and concerns.  For individual provider: See Shea Evans

## 2018-08-23 NOTE — Progress Notes (Signed)
[facility] Progress Note  Subjective: no labs  Yet, 600 cc UOP yest, BP's > 90 on pressors and midodrine  Vitals:   08/23/18 0615 08/23/18 0630 08/23/18 0645 08/23/18 0700  BP:      Pulse:      Resp: 16 16 19 19   Temp:      TempSrc:      SpO2:      Weight:      Height:        Inpatient medications: . allopurinol  300 mg Oral Daily  . Chlorhexidine Gluconate Cloth  6 each Topical Daily  . collagenase   Topical Daily  . heparin injection (subcutaneous)  5,000 Units Subcutaneous Q8H  . levothyroxine  125 mcg Oral Q0600  . linezolid  600 mg Oral Q12H  . mouth rinse  15 mL Mouth Rinse BID  . midodrine  10 mg Oral TID WC  . pantoprazole  40 mg Oral BID  . pravastatin  40 mg Oral q1800  . sodium bicarbonate  650 mg Oral BID  . sodium chloride flush  10-40 mL Intracatheter Q12H  . sodium zirconium cyclosilicate  10 g Oral Daily   . sodium chloride 10 mL/hr at 08/23/18 0600  . norepinephrine (LEVOPHED) Adult infusion 40 mcg/min (08/23/18 0600)  . phenylephrine (NEO-SYNEPHRINE) Adult infusion Stopped (08/22/18 0759)  . piperacillin-tazobactam (ZOSYN)  IV 12.5 mL/hr at 08/23/18 0600  .  sodium bicarbonate (isotonic) infusion in sterile water 50 mL/hr at 08/23/18 0600   nitroGLYCERIN, ondansetron **OR** ondansetron (ZOFRAN) IV, senna-docusate, sodium chloride flush, traMADol  Iron/TIBC/Ferritin/ %Sat    Component Value Date/Time   IRON 47 (L) 08/15/2018 1041   TIBC 234 (L) 08/15/2018 1041   FERRITIN 543 (H) 08/15/2018 1041   IRONPCTSAT 20 08/15/2018 1041    Exam: Gen chronically ill appearing, alert, dry mouth No rash, cyanosis or gangrene Sclera anicteric, throat clear No jvd or bruits Chest clear bilat to bases, no rales or wheezing RRR no MRG Abd protuberant, nontender, 3+ ascites, +BS GU normal male MS no joint effusions or deformity Ext 2+ chronic pitting dependent thigh edema, no wounds or ulcers Neuro is alert, Ox 3 , nf, no asterixis    Home meds:  - apixaban 5 bid/  pravastatin 40 qd  - epleronone 25 bid/ metolazone 5 mg mwf/ KCL 15 ml tid (40 mqd/ 18ml)/ torsemide 80 hs  - pantoprazole 40 bid/ levothyroxine 125 ug/ allopurinol 300 qd/ vitamins/ prns  - midodrine 10 bid/ sl ntg prn   CXR - no edema, CM  ECHO 5/19 - LVEF 15%, diffuse hypoK, mod MR, mod decreased RV fxn, biatrial enlargement   Impression/ Plan: 1. AKI on CKD 3 - from hypotension and relative vol depletion. Underlying cardiorenal syndrome.  SP large vol paracentesis here which dropped BP's, and now on pressors. Creat improving today, K+ now under control. Cont supportive care. Medical Rx.   2. Shock - cardiogenic + septic, on pressors/ midodrine 3. ID - probable SBP - +8000 cell ct on peritoneal fluid sample, on IV abx 4. Hyperkalemia - K+ 4.0. Will cont low dose Lokelma given chemo starting today. Cont renal diet.   5. Cirrhosis - w/ ascites, sp 5L paracentesis this admit 6. Severe CM EF 15% 7. Hyponatremia - Na+ 125- 130 range due to  8. Atrial fib, chronic - per cardiology 9. Amyloid cardiomyopathy    Kelly Splinter MD [facility] pager [phone]   08/23/2018, 8:20 AM   Recent Labs  Lab  08/21/18 0340 08/22/18 0338 08/22/18 0600  NA 130* 127*  --   K 6.6* 7.1* 4.8  CL 103 98  --   CO2 15* 13*  --   GLUCOSE 91 186*  --   BUN 87* 84*  --   CREATININE 3.17* 3.29*  --   CALCIUM 8.8* 7.9*  --   ALBUMIN 2.8* 2.6*  --    Recent Labs  Lab 08/21/18 0340 08/22/18 0338  AST 43* 34  ALT 27 22  ALKPHOS 113 105  BILITOT 2.1* 2.7*  PROT 6.0* 5.7*   Recent Labs  Lab 08/20/18 1250 08/21/18 0340 08/22/18 0338  WBC 20.8* 17.8* 24.4*  NEUTROABS 19.5*  --   --   HGB 11.2* 10.8* 11.4*  HCT 34.4* 33.7* 35.6*  MCV 97.5 98.3 98.6  PLT 215 195 188

## 2018-08-23 NOTE — Progress Notes (Signed)
Pharmacist Heart Failure Core Measure Documentation  Assessment: Cody Schwartz has an EF documented as 15% on 5/1/19by ECHO.  Rationale: Heart failure patients with left ventricular systolic dysfunction (LVSD) and an EF < 40% should be prescribed an angiotensin converting enzyme inhibitor (ACEI) or angiotensin receptor blocker (ARB) at discharge unless a contraindication is documented in the medical record.  This patient is not currently on an ACEI or ARB for HF.  This note is being placed in the record in order to provide documentation that a contraindication to the use of these agents is present for this encounter.  ACE Inhibitor or Angiotensin Receptor Blocker is contraindicated (specify all that apply)  []   ACEI allergy AND ARB allergy []   Angioedema []   Moderate or severe aortic stenosis []   Hyperkalemia [x]   Hypotension []   Renal artery stenosis [x]   Worsening renal function, preexisting renal disease or dysfunction   Cody Schwartz 08/23/2018 1:46 PM

## 2018-08-23 NOTE — Progress Notes (Signed)
NAME:  Cody Schwartz, MRN:  [id number], DOB:  1956/03/08, LOS: 3 ADMISSION DATE:  08/20/2018, CONSULTATION DATE: 08/13/2018 REFERRING MD:  Dr Darrick Meigs, CHIEF COMPLAINT: Sepsis  Brief History   Patient with a history of biventricular failure ejection fraction of 15% Pulmonary hypertension, liver cirrhosis,  History of present illness   Presented with generalized weakness, hypotension Known to have ascites Did have a paracentesis performed-removal of 5 L of yellow fluid Acute on chronic kidney disease  08/23/18-complaining about a lot of nausea this morning  Past Medical History  Biventricular heart failure Ischemic cardia myopathy Coronary artery disease Liver cirrhosis Amyloid cardiomyopathy Chronic kidney disease Acute kidney injury  Significant Hospital Events   Paracentesis on 08/21/2018 for 5 L of fluid Persistent hypotension  Consults:  Cardiology Radiology Renal  Procedures:  Paracentesis 08/21/2018 Central line placement 08/21/2018  Significant Diagnostic Tests:   Micro Data:  Ascitic fluid 08/20/2018 Blood cultures 08/20/2018  Antimicrobials:  Zosyn 11/27>> Vancomycin  11/27 >>  Interim history/subjective:  Hemodynamics have stabilized He was on Levophed and Neo-Synephrine Currently just on Levophed  Objective   Blood pressure (!) 86/49, pulse (!) 123, temperature (!) 96.8 F (36 C), temperature source Axillary, resp. rate (!) 24, height 5\' 8"  (1.727 m), weight 73.7 kg, SpO2 92 %. CVP:  [16 mmHg-18 mmHg] 18 mmHg      Intake/Output Summary (Last 24 hours) at 08/23/2018 2031 Last data filed at 08/23/2018 1830 Gross per 24 hour  Intake 3056.74 ml  Output 530 ml  Net 2526.74 ml   Filed Weights   08/20/18 1351 08/22/18 0500 08/23/18 0500  Weight: 78.6 kg 71.6 kg 73.7 kg    Examination: General: Awake and alert HENT: Moist oral mucosa Lungs: Poor air entry at the bases Cardiovascular: S1-S2 appreciated Abdomen: Bowel sounds  appreciated Extremities: Multiple skin lesions lower extremity Neuro: Alert and oriented x3, moving all extremities  Resolved Hospital Problem list    Assessment & Plan:  Sepsis Related to peritonitis Blood cultures negative Continue to follow peritoneal fluid cultures  Acute kidney injury Slight improvement in kidney parameters   Chronic kidney disease -Likely related to cardiorenal syndrome  Pulmonary hypertension -Related to biventricular heart disease  Biventricular heart failure -Appears to be end-stage heart disease -Cardiology following -Cardiology opinion noted  Amyloid cardiomyopathy  Hypotension -Related to sepsis  SBP -We will continue antibiotics at the present time-day 2 of   Multiorgan failure -Cardiac dysfunction, kidney dysfunction, hepatic dysfunction  Liver cirrhosis  Input from cardiology noted Input from renal noted  Continue Levophed-still with very significant risk of decompensation Control of nausea  I did discuss prognosis with patient's spouse today  Best practice:  Diet: Renal diet DVT prophylaxis: Subcu heparin GI prophylaxis: Protonix Mobility: Bedrest Code Status: Full code Family Communication: Discussed with spouse at bedside this morning Disposition:   Labs   CBC: Recent Labs  Lab 08/20/18 1250 08/21/18 0340 08/22/18 0338  WBC 20.8* 17.8* 24.4*  NEUTROABS 19.5*  --   --   HGB 11.2* 10.8* 11.4*  HCT 34.4* 33.7* 35.6*  MCV 97.5 98.3 98.6  PLT 215 195 423    Basic Metabolic Panel: Recent Labs  Lab 08/20/18 1250  08/20/18 1851 08/21/18 0340 08/22/18 0338 08/22/18 0600 08/23/18 0830  NA 126*  --  128* 130* 127*  --  129*  K 7.0*   < > 6.4* 6.6* 7.1* 4.8 4.1  CL 96*  --  104 103 98  --  94*  CO2 15*  --  14* 15* 13*  --  19*  GLUCOSE 111*  --  82 91 186*  --  165*  BUN 94*  --  90* 87* 84*  --  71*  CREATININE 3.37*  --  3.25* 3.17* 3.29*  --  2.80*  CALCIUM 9.2  --  8.7* 8.8* 7.9*  --  7.6*   < > =  values in this interval not displayed.   GFR: Estimated Creatinine Clearance: 25.8 mL/min (A) (by C-G formula based on SCr of 2.8 mg/dL (H)). Recent Labs  Lab 08/20/18 1250  08/20/18 1633 08/20/18 1851 08/20/18 2248 08/21/18 0340 08/22/18 0338  WBC 20.8*  --   --   --   --  17.8* 24.4*  LATICACIDVEN  --    < > 2.0* 2.5* 2.0* 1.7  --    < > = values in this interval not displayed.    Liver Function Tests: Recent Labs  Lab 08/20/18 1303 08/21/18 0340 08/22/18 0338  AST 58* 43* 34  ALT 27 27 22   ALKPHOS 132* 113 105  BILITOT 1.9* 2.1* 2.7*  PROT 6.8 6.0* 5.7*  ALBUMIN 3.1* 2.8* 2.6*   No results for input(s): LIPASE, AMYLASE in the last 168 hours. Recent Labs  Lab 08/20/18 1633  AMMONIA 14    ABG    Component Value Date/Time   PHART 7.405 08/22/2018 0014   PCO2ART 21.3 (L) 08/22/2018 0014   PO2ART 80.7 (L) 08/22/2018 0014   HCO3 13.2 (L) 08/22/2018 0014   TCO2 19 (L) 01/10/2018 1338   ACIDBASEDEF 9.8 (H) 08/22/2018 0014   O2SAT 96.2 08/22/2018 0014     Coagulation Profile: No results for input(s): INR, PROTIME in the last 168 hours.  Cardiac Enzymes: No results for input(s): CKTOTAL, CKMB, CKMBINDEX, TROPONINI in the last 168 hours.  HbA1C: No results found for: HGBA1C  CBG: Recent Labs  Lab 08/20/18 1311  GLUCAP 107*    Review of Systems:   Review of Systems  Constitutional: Negative.   HENT: Negative.   Eyes: Negative.   Respiratory: Negative for shortness of breath.   Cardiovascular: Negative.  Negative for chest pain, claudication and leg swelling.  Gastrointestinal: Positive for nausea and vomiting.  Genitourinary: Negative.   Skin: Positive for rash.     Past Medical History  He,  has a past medical history of Congestive heart failure (CHF) ([place]), Hyperlipidemia, and Hypokalemia.   Surgical History    Past Surgical History:  Procedure Laterality Date  . BIOPSY  08/05/2018   Procedure: BIOPSY;  Surgeon: Lavena Bullion, DO;   Location: [facility];  Service: Gastroenterology;;  . ESOPHAGOGASTRODUODENOSCOPY (EGD) WITH PROPOFOL N/A 08/05/2018   Procedure: ESOPHAGOGASTRODUODENOSCOPY (EGD) WITH PROPOFOL;  Surgeon: Lavena Bullion, DO;  Location: [facility];  Service: Gastroenterology;  Laterality: N/A;  . liver biopsy  07/22/2018  . RIGHT/LEFT HEART CATH AND CORONARY ANGIOGRAPHY N/A 01/10/2018   Procedure: RIGHT/LEFT HEART CATH AND CORONARY ANGIOGRAPHY;  Surgeon: Jolaine Artist, MD;  Location: [place] [facility];  Service: Cardiovascular;  Laterality: N/A;     Social History   reports that he has never smoked. He has never used smokeless tobacco. He reports that he does not drink alcohol or use drugs.   Family History   His family history includes CAD in his paternal grandfather and paternal grandmother; Stroke in his paternal grandfather. There is no history of Colon cancer.   Allergies Allergies  Allergen Reactions  . Spironolactone Itching     Critical care time:  35min

## 2018-08-24 DIAGNOSIS — I9589 Other hypotension: Secondary | ICD-10-CM

## 2018-08-24 LAB — BASIC METABOLIC PANEL
Anion gap: 17 — ABNORMAL HIGH (ref 5–15)
BUN: 72 mg/dL — ABNORMAL HIGH (ref 8–23)
CO2: 21 mmol/L — ABNORMAL LOW (ref 22–32)
Calcium: 7.2 mg/dL — ABNORMAL LOW (ref 8.9–10.3)
Chloride: 88 mmol/L — ABNORMAL LOW (ref 98–111)
Creatinine, Ser: 2.8 mg/dL — ABNORMAL HIGH (ref 0.61–1.24)
GFR calc Af Amer: 26 mL/min — ABNORMAL LOW (ref >60)
GFR, EST NON AFRICAN AMERICAN: 23 mL/min — AB (ref >60)
Glucose, Bld: 182 mg/dL — ABNORMAL HIGH (ref 70–99)
POTASSIUM: 3.8 mmol/L (ref 3.5–5.1)
Sodium: 126 mmol/L — ABNORMAL LOW (ref 135–145)

## 2018-08-24 NOTE — Progress Notes (Signed)
Progress Note  Patient Name: BHAVIK CABINESS Date of Encounter: 08/24/2018  Primary Cardiologist: Shirlee More, MD   Subjective   Currently his daughter is in room.  Room is quiet, he he is sleeping.  She states that he had a better night last night.  He is eager to try to get up and go to chair or perhaps go to the bathroom. Inpatient Medications    Scheduled Meds: . allopurinol  300 mg Oral Daily  . Chlorhexidine Gluconate Cloth  6 each Topical Daily  . collagenase   Topical Daily  . heparin injection (subcutaneous)  5,000 Units Subcutaneous Q8H  . levothyroxine  125 mcg Oral Q0600  . mouth rinse  15 mL Mouth Rinse BID  . midodrine  10 mg Oral TID WC  . pantoprazole (PROTONIX) IV  40 mg Intravenous Q12H  . pravastatin  40 mg Oral q1800  . sodium bicarbonate  650 mg Oral BID  . sodium chloride flush  10-40 mL Intracatheter Q12H   Continuous Infusions: . sodium chloride Stopped (08/23/18 1224)  . sodium chloride    . linezolid (ZYVOX) IV Stopped (08/24/18 1056)  . norepinephrine (LEVOPHED) Adult infusion 38 mcg/min (08/24/18 1100)  . piperacillin-tazobactam (ZOSYN)  IV Stopped (08/24/18 0730)  .  sodium bicarbonate (isotonic) infusion in sterile water 40 mL/hr at 08/24/18 0818   PRN Meds: sodium chloride, nitroGLYCERIN, ondansetron **OR** ondansetron (ZOFRAN) IV, senna-docusate, sodium chloride flush, traMADol   Vital Signs    Vitals:   08/24/18 0700 08/24/18 0800 08/24/18 1000 08/24/18 1100  BP:  (!) 81/60 (!) 95/59   Pulse:  (!) 122 79   Resp:  18 17 18   Temp:  97.8 F (36.6 C)    TempSrc:  Oral    SpO2: 94% 94% 94% 96%  Weight:      Height:        Intake/Output Summary (Last 24 hours) at 08/24/2018 1136 Last data filed at 08/24/2018 1100 Gross per 24 hour  Intake 3265.65 ml  Output 475 ml  Net 2790.65 ml   Filed Weights   08/22/18 0500 08/23/18 0500 08/24/18 0500  Weight: 71.6 kg 73.7 kg 77.7 kg    Telemetry    Atrial fibrillation heart rate  130's- Personally Reviewed  ECG    Atrial fibrillation with RVR- Personally Reviewed  Physical Exam   GEN: Ill-appearing, sleeping comfortably in bed, jaundice  HEENT: normal Right IJ in place GI: mildly distended MS: no deformity or atrophy  Neuro: Sleeping  psych: Sleeping  Labs    Chemistry Recent Labs  Lab 08/20/18 1303  08/21/18 0340 08/22/18 0338 08/22/18 0600 08/23/18 0830 08/24/18 0500  NA  --    < > 130* 127*  --  129* 126*  K  --    < > 6.6* 7.1* 4.8 4.1 3.8  CL  --    < > 103 98  --  94* 88*  CO2  --    < > 15* 13*  --  19* 21*  GLUCOSE  --    < > 91 186*  --  165* 182*  BUN  --    < > 87* 84*  --  71* 72*  CREATININE  --    < > 3.17* 3.29*  --  2.80* 2.80*  CALCIUM  --    < > 8.8* 7.9*  --  7.6* 7.2*  PROT 6.8  --  6.0* 5.7*  --   --   --   ALBUMIN  3.1*  --  2.8* 2.6*  --   --   --   AST 58*  --  43* 34  --   --   --   ALT 27  --  27 22  --   --   --   ALKPHOS 132*  --  113 105  --   --   --   BILITOT 1.9*  --  2.1* 2.7*  --   --   --   GFRNONAA  --    < > 20* 19*  --  23* 23*  GFRAA  --    < > 23* 22*  --  26* 26*  ANIONGAP  --    < > 12 16*  --  16* 17*   < > = values in this interval not displayed.     Hematology Recent Labs  Lab 08/20/18 1250 08/21/18 0340 08/22/18 0338  WBC 20.8* 17.8* 24.4*  RBC 3.53* 3.43* 3.61*  HGB 11.2* 10.8* 11.4*  HCT 34.4* 33.7* 35.6*  MCV 97.5 98.3 98.6  MCH 31.7 31.5 31.6  MCHC 32.6 32.0 32.0  RDW 15.2 15.3 15.7*  PLT 215 195 188    Cardiac EnzymesNo results for input(s): TROPONINI in the last 168 hours.  Recent Labs  Lab 08/20/18 1349  TROPIPOC 0.02     BNP Recent Labs  Lab 08/20/18 1303  BNP 1,090.4*     DDimer No results for input(s): DDIMER in the last 168 hours.   Radiology    No results found.  Cardiac Studies   Echocardiogram 01/22/2018:  - Moderately dilated LV with EF 15%, diffuse hypokinesis. Mildly   dilated RV with moderately decreased systolic function. Biatrial    enlargement. Moderate functional mitral regurgitation. Mild   pulmonary hypertension. Dilated IVC suggestive of elevated RV   filling pressure.  Cardiac catheterization right and left 01/10/2018:  Mid RCA lesion is 30% stenosed.  Previously placed Mid RCA to Dist RCA stent (unknown type) is widely patent.  Previously placed Ost 2nd Mrg to 2nd Mrg stent (unknown type) is widely patent.  Prox Cx lesion is 30% stenosed.  Ost 1st Mrg to 1st Mrg lesion is 40% stenosed.  Previously placed Prox LAD to Mid LAD stent (unknown type) is widely patent.  Prox LAD lesion is 65% stenosed.  Mid LAD lesion is 30% stenosed.  Previously placed Mid LAD to Dist LAD stent (unknown type) is widely patent.  Dist LAD lesion is 100% stenosed.   Findings:  Ao = 82/59 (69) LV = 87/17 RA =  13 RV = 46/13 PA = 48/23 (33) PCW = 21 Fick cardiac output/index = 3.2/1.7 PVR = 3.9 WU Aosat = 96% PA sat = 51%, 51%  Assessment: 1. 3v CAD with patent stents (LAD x 2), OM2, RCA 2. There is a 60-70% lesion int the mid LAD prior to previously placed stent. This is not flow-limiting. The apical LAD is occluded chronically 3. EF 25% by ECHO 4. Mild to moderately elevated filling pressures with low cardiac output  Patient Profile     62 y.o. male with severe biventricular heart failure due to mixed ICM/NICM EF 17%, PYP scan c/w amyloidosis (started on tafamadis, genetic testing negative for TTR), CAD s/p PCI (last in 2017), CKD, cirrhosis with ascites requiring frequent paracenteses, moderate pulm HTN, atrial fib (dx 12/2017), bilateral leg wounds. He was admitted with setting of several days of weakness, poor PO intake, hyperkalemia. He was found to have sepsis possibly due to  chronic leg wounds with acute worsening vs SBP, AKI with hyperkalemia, and worsening heart failure. Renal, ID, PCCM on board.  Assessment & Plan    Mixture of sepsis/cardiogenic shock with severe biventricular heart failure and acute on  chronic hypotension -Continuing to wean norepinephrine.  Remember, baseline blood pressure has been approximately 85 systolic in clinic. -Continuing to receive antibiotics. -Stage D heart failure.  Prior notes from Dr. Haroldine Laws once again reviewed.  EF in the 15% range. -Had lengthy discussion with him yesterday about end-of-life.  He is not ready to make any decisions at this point.  Appreciate palliative care team discussion as well.   Atrial fibrillation with rapid ventricular response - Hopefully heart rate will improve as underlying infection as well as decreasing beta agonist with norepinephrine.  Acute kidney injury - Likely in part cardiorenal from hypotension.  Creatinine 2.8 today.  Stable from yesterday.  Cirrhosis with possible SBP -5 L paracentesis -Antibiotics.  No organisms.  For questions or updates, please contact [place] Please consult [email or web address] for contact info under        Signed, Candee Furbish, MD  08/24/2018, 11:36 AM

## 2018-08-24 NOTE — Progress Notes (Signed)
NAME:  Cody Schwartz, MRN:  [id number], DOB:  1955-10-30, LOS: 4 ADMISSION DATE:  08/20/2018, CONSULTATION DATE: 08/13/2018 REFERRING MD:  Dr Darrick Meigs, CHIEF COMPLAINT: Sepsis  Brief History   Patient with a history of biventricular failure ejection fraction of 15% Pulmonary hypertension, liver cirrhosis, chronic kidney disease  History of present illness   Presented with generalized weakness, hypotension Known to have ascites Did have a paracentesis performed-removal of 5 L of yellow fluid Acute on chronic kidney disease  08/23/18-complaining about a lot of nausea this morning 12/1 feels a little bit better today, still hypotensive, still on Levophed  Past Medical History  Biventricular heart failure Ischemic cardia myopathy Coronary artery disease Liver cirrhosis Amyloid cardiomyopathy Chronic kidney disease Acute kidney injury  Significant Hospital Events   Paracentesis on 08/21/2018 for 5 L of fluid Persistent hypotension  Consults:  Cardiology Radiology Renal  Procedures:  Paracentesis 08/21/2018 Central line placement 08/21/2018  Significant Diagnostic Tests:   Micro Data:  Ascitic fluid 08/20/2018 Blood cultures 08/20/2018  Antimicrobials:  Zosyn 11/27>> Vancomycin  11/27 >>11/28 Linezolid 11/29>>   Interim history/subjective:  Hemodynamics have stabilized on Levophed He was on Levophed and Neo-Synephrine Currently just on Levophed  Objective   Blood pressure (!) 81/60, pulse (!) 122, temperature (!) 97.1 F (36.2 C), temperature source Axillary, resp. rate 18, height 5\' 8"  (1.727 m), weight 77.7 kg, SpO2 (!) 80 %. CVP:  [16 mmHg-18 mmHg] 17 mmHg      Intake/Output Summary (Last 24 hours) at 08/24/2018 0937 Last data filed at 08/24/2018 0800 Gross per 24 hour  Intake 2929.93 ml  Output 445 ml  Net 2484.93 ml   Filed Weights   08/22/18 0500 08/23/18 0500 08/24/18 0500  Weight: 71.6 kg 73.7 kg 77.7 kg    Examination: General: Alert and  oriented x3 HENT: Moist oral mucosa Lungs: Poor air entry bilaterally Cardiovascular: S2 appreciated Abdomen: Bowel sounds appreciated, nontender abdomen Extremities: Skin lesions lower extremity Neuro: Alert and oriented x3, moving all extremities  Resolved Hospital Problem list    Assessment & Plan:   Sepsis Related to peritonitis Blood cultures negative Continue to follow peritoneal fluid cultures-no growth after 72 hours Continues on antibiotics-Zosyn, linezolid CBC tomorrow  Acute kidney injury Slight improvement in kidney parameters Creatinine stabilizing Not a dialysis candidate Renal following  Chronic kidney disease Cardiorenal syndrome  Pulmonary hypertension Biventricular heart disease Supportive measures Oxygen supplementation as needed  Biventricular heart failure End-stage heart disease Cardiology opinion noted  Amyloid cardiomyopathy  Hypotension We will attempt to wean Levophed Blood pressure usually runs low  SBP Day 6 of antibiotics today-plan to discontinue at the 7  Multiorgan failure -Cardiac dysfunction, kidney dysfunction, hepatic dysfunction  Liver cirrhosis  Input from cardiology noted Input from renal noted  Continue Levophed-still with very significant risk of decompensation  I did discuss prognosis with patient's spouse 11/30  Best practice:  Diet: Renal diet DVT prophylaxis: Subcu heparin GI prophylaxis: Protonix Mobility: Bedrest Code Status: Full code Family Communication: Discussed with daughter at bedside today Disposition:   Labs   CBC: Recent Labs  Lab 08/20/18 1250 08/21/18 0340 08/22/18 0338  WBC 20.8* 17.8* 24.4*  NEUTROABS 19.5*  --   --   HGB 11.2* 10.8* 11.4*  HCT 34.4* 33.7* 35.6*  MCV 97.5 98.3 98.6  PLT 215 195 762    Basic Metabolic Panel: Recent Labs  Lab 08/20/18 1851 08/21/18 0340 08/22/18 0338 08/22/18 0600 08/23/18 0830 08/24/18 0500  NA 128* 130* 127*  --  129* 126*  K 6.4*  6.6* 7.1* 4.8 4.1 3.8  CL 104 103 98  --  94* 88*  CO2 14* 15* 13*  --  19* 21*  GLUCOSE 82 91 186*  --  165* 182*  BUN 90* 87* 84*  --  71* 72*  CREATININE 3.25* 3.17* 3.29*  --  2.80* 2.80*  CALCIUM 8.7* 8.8* 7.9*  --  7.6* 7.2*   GFR: Estimated Creatinine Clearance: 25.8 mL/min (A) (by C-G formula based on SCr of 2.8 mg/dL (H)). Recent Labs  Lab 08/20/18 1250  08/20/18 1633 08/20/18 1851 08/20/18 2248 08/21/18 0340 08/22/18 0338  WBC 20.8*  --   --   --   --  17.8* 24.4*  LATICACIDVEN  --    < > 2.0* 2.5* 2.0* 1.7  --    < > = values in this interval not displayed.    Liver Function Tests: Recent Labs  Lab 08/20/18 1303 08/21/18 0340 08/22/18 0338  AST 58* 43* 34  ALT 27 27 22   ALKPHOS 132* 113 105  BILITOT 1.9* 2.1* 2.7*  PROT 6.8 6.0* 5.7*  ALBUMIN 3.1* 2.8* 2.6*   No results for input(s): LIPASE, AMYLASE in the last 168 hours. Recent Labs  Lab 08/20/18 1633  AMMONIA 14    ABG    Component Value Date/Time   PHART 7.405 08/22/2018 0014   PCO2ART 21.3 (L) 08/22/2018 0014   PO2ART 80.7 (L) 08/22/2018 0014   HCO3 13.2 (L) 08/22/2018 0014   TCO2 19 (L) 01/10/2018 1338   ACIDBASEDEF 9.8 (H) 08/22/2018 0014   O2SAT 96.2 08/22/2018 0014     Coagulation Profile: No results for input(s): INR, PROTIME in the last 168 hours.  Cardiac Enzymes: No results for input(s): CKTOTAL, CKMB, CKMBINDEX, TROPONINI in the last 168 hours.  HbA1C: No results found for: HGBA1C  CBG: Recent Labs  Lab 08/20/18 1311  GLUCAP 107*    Review of Systems:   Review of Systems  Constitutional: Negative.   HENT: Negative.   Eyes: Negative.   Respiratory: Negative for cough and shortness of breath.   Cardiovascular: Negative.  Negative for chest pain and leg swelling.  Gastrointestinal: Positive for nausea. Negative for vomiting.  Skin: Positive for rash.  All other systems reviewed and are negative.    Past Medical History  He,  has a past medical history of  Congestive heart failure (CHF) ([place]), Hyperlipidemia, and Hypokalemia.   Surgical History    Past Surgical History:  Procedure Laterality Date  . BIOPSY  08/05/2018   Procedure: BIOPSY;  Surgeon: Lavena Bullion, DO;  Location: [facility];  Service: Gastroenterology;;  . ESOPHAGOGASTRODUODENOSCOPY (EGD) WITH PROPOFOL N/A 08/05/2018   Procedure: ESOPHAGOGASTRODUODENOSCOPY (EGD) WITH PROPOFOL;  Surgeon: Lavena Bullion, DO;  Location: [facility];  Service: Gastroenterology;  Laterality: N/A;  . liver biopsy  07/22/2018  . RIGHT/LEFT HEART CATH AND CORONARY ANGIOGRAPHY N/A 01/10/2018   Procedure: RIGHT/LEFT HEART CATH AND CORONARY ANGIOGRAPHY;  Surgeon: Jolaine Artist, MD;  Location: [place] [facility];  Service: Cardiovascular;  Laterality: N/A;     Social History   reports that he has never smoked. He has never used smokeless tobacco. He reports that he does not drink alcohol or use drugs.   Family History   His family history includes CAD in his paternal grandfather and paternal grandmother; Stroke in his paternal grandfather. There is no history of Colon cancer.   Allergies Allergies  Allergen Reactions  . Spironolactone Itching  Critical care time: 8min

## 2018-08-24 NOTE — Progress Notes (Signed)
eLink Physician-Brief Progress Note Patient Name: Cody Schwartz DOB: 11-Jul-1956 MRN: [id number]   Date of Service  08/24/2018  HPI/Events of Note  Afib with moderately rapid VRR. Severe systolic heart failure due to advanced ischemic/ non-ischemic cardiomyopathy. Cardiology aware of VRR which is unchanged from earlier yesterday.  eICU Interventions  Will defer to cardiology regarding whether to treat rapid ventricular response rate with ?amiodarone vs observe        [place]wo U Ogan 08/24/2018, 3:24 AM

## 2018-08-24 NOTE — Progress Notes (Signed)
[facility] Progress Note  Subjective: creat unchanged 2.80, 400 cc UOP  Vitals:   08/24/18 0346 08/24/18 0400 08/24/18 0424 08/24/18 0500  BP:   (!) 84/61   Pulse:  (!) 137 (!) 127   Resp:  18 20   Temp: (!) 97.1 F (36.2 C)     TempSrc: Axillary     SpO2:  91% 92%   Weight:    77.7 kg  Height:        Inpatient medications: . allopurinol  300 mg Oral Daily  . Chlorhexidine Gluconate Cloth  6 each Topical Daily  . collagenase   Topical Daily  . heparin injection (subcutaneous)  5,000 Units Subcutaneous Q8H  . levothyroxine  125 mcg Oral Q0600  . mouth rinse  15 mL Mouth Rinse BID  . midodrine  10 mg Oral TID WC  . pantoprazole (PROTONIX) IV  40 mg Intravenous Q12H  . pravastatin  40 mg Oral q1800  . sodium bicarbonate  650 mg Oral BID  . sodium chloride flush  10-40 mL Intracatheter Q12H  . sodium zirconium cyclosilicate  10 g Oral Daily   . sodium chloride Stopped (08/23/18 1224)  . sodium chloride    . linezolid (ZYVOX) IV Stopped (08/23/18 2312)  . norepinephrine (LEVOPHED) Adult infusion 39 mcg/min (08/24/18 0639)  . phenylephrine (NEO-SYNEPHRINE) Adult infusion Stopped (08/22/18 0759)  . piperacillin-tazobactam (ZOSYN)  IV 12.5 mL/hr at 08/24/18 0639  .  sodium bicarbonate (isotonic) infusion in sterile water 50 mL/hr at 08/24/18 0639   sodium chloride, nitroGLYCERIN, ondansetron **OR** ondansetron (ZOFRAN) IV, senna-docusate, sodium chloride flush, traMADol  Iron/TIBC/Ferritin/ %Sat    Component Value Date/Time   IRON 47 (L) 08/15/2018 1041   TIBC 234 (L) 08/15/2018 1041   FERRITIN 543 (H) 08/15/2018 1041   IRONPCTSAT 20 08/15/2018 1041    Exam: Gen chronically ill appearing, alert, dry mouth No rash, cyanosis or gangrene Sclera anicteric, throat clear No jvd or bruits Chest clear bilat to bases, no rales or wheezing RRR no MRG Abd protuberant, nontender, 3+ ascites, +BS GU normal male MS no joint effusions or deformity Ext 2+ chronic  pitting dependent thigh edema, no wounds or ulcers Neuro is alert, Ox 3 , nf, no asterixis    Home meds:  - apixaban 5 bid/  pravastatin 40 qd  - epleronone 25 bid/ metolazone 5 mg mwf/ KCL 15 ml tid (40 mqd/ 65ml)/ torsemide 80 hs  - pantoprazole 40 bid/ levothyroxine 125 ug/ allopurinol 300 qd/ vitamins/ prns  - midodrine 10 bid/ sl ntg prn   CXR - no edema, CM  ECHO 5/19 - LVEF 15%, diffuse hypoK, mod MR, mod decreased RV fxn, biatrial enlargement   Impression/ Plan: 1. AKI on CKD 3 - from hypotension and relative vol depletion. Underlying cardiorenal syndrome. Trying to wean pressors. Creat stable 2.8. Making urine. Poor prognosis. Pt and his wife were told recently by their nephrologist that he does not recommend dialysis due to his overall poor health, and per my conversations with them they have accepted this. Recommend supportive care, no further suggestions, will sign off.  2. Shock - cardiogenic + septic, on pressors/ midodrine 3. ID - probable SBP - +8000 cell ct on peritoneal fluid sample, on IV abx 4. Hyperkalemia - K+ 3.8 , will dc Lokelma, Cont renal diet.   5. Cirrhosis - w/ ascites, sp 5L paracentesis this admit 6. Severe CM EF 15% 7. Hyponatremia - Na+ 125- 130 range due to  8. Atrial fib,  chronic - per cardiology 9. Amyloid cardiomyopathy    Kelly Splinter MD [facility] pager [phone]   08/24/2018, 8:01 AM   Recent Labs  Lab 08/21/18 0340 08/22/18 0338  08/23/18 0830 08/24/18 0500  NA 130* 127*  --  129* 126*  K 6.6* 7.1*   < > 4.1 3.8  CL 103 98  --  94* 88*  CO2 15* 13*  --  19* 21*  GLUCOSE 91 186*  --  165* 182*  BUN 87* 84*  --  71* 72*  CREATININE 3.17* 3.29*  --  2.80* 2.80*  CALCIUM 8.8* 7.9*  --  7.6* 7.2*  ALBUMIN 2.8* 2.6*  --   --   --    < > = values in this interval not displayed.   Recent Labs  Lab 08/21/18 0340 08/22/18 0338  AST 43* 34  ALT 27 22  ALKPHOS 113 105  BILITOT 2.1* 2.7*  PROT 6.0* 5.7*    Recent Labs  Lab 08/20/18 1250 08/21/18 0340 08/22/18 0338  WBC 20.8* 17.8* 24.4*  NEUTROABS 19.5*  --   --   HGB 11.2* 10.8* 11.4*  HCT 34.4* 33.7* 35.6*  MCV 97.5 98.3 98.6  PLT 215 195 188

## 2018-08-25 ENCOUNTER — Encounter (HOSPITAL_COMMUNITY): Payer: BLUE CROSS/BLUE SHIELD | Admitting: Internal Medicine

## 2018-08-25 DIAGNOSIS — Z7189 Other specified counseling: Secondary | ICD-10-CM

## 2018-08-25 DIAGNOSIS — Z515 Encounter for palliative care: Secondary | ICD-10-CM

## 2018-08-25 DIAGNOSIS — R531 Weakness: Secondary | ICD-10-CM

## 2018-08-25 LAB — BASIC METABOLIC PANEL
Anion gap: 17 — ABNORMAL HIGH (ref 5–15)
BUN: 75 mg/dL — ABNORMAL HIGH (ref 8–23)
CO2: 21 mmol/L — ABNORMAL LOW (ref 22–32)
Calcium: 7.3 mg/dL — ABNORMAL LOW (ref 8.9–10.3)
Chloride: 87 mmol/L — ABNORMAL LOW (ref 98–111)
Creatinine, Ser: 2.97 mg/dL — ABNORMAL HIGH (ref 0.61–1.24)
GFR calc non Af Amer: 21 mL/min — ABNORMAL LOW (ref >60)
GFR, EST AFRICAN AMERICAN: 25 mL/min — AB (ref >60)
Glucose, Bld: 203 mg/dL — ABNORMAL HIGH (ref 70–99)
Potassium: 3.8 mmol/L (ref 3.5–5.1)
Sodium: 125 mmol/L — ABNORMAL LOW (ref 135–145)

## 2018-08-25 LAB — CULTURE, BLOOD (ROUTINE X 2)
CULTURE: NO GROWTH
Culture: NO GROWTH
SPECIAL REQUESTS: ADEQUATE
SPECIAL REQUESTS: ADEQUATE

## 2018-08-25 MED ORDER — METOCLOPRAMIDE HCL 5 MG/ML IJ SOLN: 5.0000 mg | Freq: Three times a day (TID) | INTRAMUSCULAR | Status: DC | PRN

## 2018-08-25 NOTE — Progress Notes (Signed)
ID PROGRESS NOTE  62 y.o. male with severe biventricular heart failure due to mixed ICM/NICMEF 17%,  CAD s/p PCI , CKD, cirrhosis with ascites requiring frequent paracenteses, moderate pulm HTN, atrial fib (dx 12/2017), bilateral leg wounds. He was admitted with setting of several days of weakness, poor PO intake, hyperkalemia. He was found to have sepsis possibly due to chronic leg wounds with acute worsening vs SBP, AKI with hyperkalemia, and worsening heart failure.Renal, ID, PCCM on board. Work up thus far shows risk for SBP given ascites cell count. He continues to be on linezolid plus piptazo  All cultures thus far ngtd Met with palliative care for DNR status, no further escalation of care  -WBC continues to trend up  Plan: continue on current IV abtx for now. He would have received 7 day course as of tomorrow.

## 2018-08-25 NOTE — Progress Notes (Signed)
Daily Progress Note   Patient Name: Cody Schwartz       Date: 08/25/2018 DOB: [birth year]  Age: 63 y.o. MRN#: [id number] Attending Physician: Laurin Coder, MD Primary Care Physician: [name]., MD Admit Date: 08/20/2018  Reason for Consultation/Follow-up: Establishing goals of care  Subjective:  Patient is awake alert Resting in bed He states that his mouth is getting dry, he's other wise alert awake and able to converse freely His wife is at bedside His 3 daughters arrived later at the bedside I met with the patient, his wife, along with PCCM colleague Marni Griffon, NP this am.  See below   Length of Stay: 5  Current Medications: Scheduled Meds:  . allopurinol  300 mg Oral Daily  . Chlorhexidine Gluconate Cloth  6 each Topical Daily  . collagenase   Topical Daily  . heparin injection (subcutaneous)  5,000 Units Subcutaneous Q8H  . levothyroxine  125 mcg Oral Q0600  . mouth rinse  15 mL Mouth Rinse BID  . midodrine  10 mg Oral TID WC  . pantoprazole (PROTONIX) IV  40 mg Intravenous Q12H  . sodium bicarbonate  650 mg Oral BID  . sodium chloride flush  10-40 mL Intracatheter Q12H    Continuous Infusions: . sodium chloride Stopped (08/23/18 1224)  . sodium chloride    . norepinephrine (LEVOPHED) Adult infusion 16 mcg/min (08/25/18 1412)  . piperacillin-tazobactam (ZOSYN)  IV 3.375 g (08/25/18 1409)    PRN Meds: sodium chloride, metoCLOPramide (REGLAN) injection, nitroGLYCERIN, ondansetron **OR** ondansetron (ZOFRAN) IV, senna-docusate, sodium chloride flush, traMADol  Physical Exam         Awake alert Appears with generalized weakness Has abdominal distension Has edema Diminished breath sounds S 1 S 2   Vital Signs: BP (!) 81/57   Pulse 99   Temp 97.6  F (36.4 C) (Axillary)   Resp 12   Ht '5\' 8"'  (1.727 m)   Wt 79.1 kg   SpO2 98%   BMI 26.51 kg/m  SpO2: SpO2: 98 % O2 Device: O2 Device: Nasal Cannula O2 Flow Rate: O2 Flow Rate (L/min): 2 L/min  Intake/output summary:   Intake/Output Summary (Last 24 hours) at 08/25/2018 1424 Last data filed at 08/25/2018 1157 Gross per 24 hour  Intake 2190.08 ml  Output 190 ml  Net 2000.08 ml  LBM: Last BM Date: 08/24/18 Baseline Weight: Weight: 78.6 kg Most recent weight: Weight: 79.1 kg      PPS 30% Palliative Assessment/Data:    Flowsheet Rows     Most Recent Value  Intake Tab  Referral Department  Critical care  Unit at Time of Referral  ICU  Palliative Care Primary Diagnosis  Cardiac  Date Notified  08/22/18  Palliative Care Type  New Palliative care  Reason for referral  Clarify Goals of Care  Date of Admission  08/20/18  Date first seen by Palliative Care  08/23/18  # of days Palliative referral response time  1 Day(s)  # of days IP prior to Palliative referral  2  Clinical Assessment  Psychosocial & Spiritual Assessment  Palliative Care Outcomes      Patient Active Problem List   Diagnosis Date Noted  . AKI (acute kidney injury) ([place])   . SBP (spontaneous bacterial peritonitis) ([place])   . Sepsis ([place]) 08/20/2018  . Hyperkalemia, diminished renal excretion 08/20/2018  . Pressure injury of skin 08/20/2018  . Hyponatremia 08/20/2018  . Atrial fibrillation, chronic 08/20/2018  . Cirrhosis of liver with ascites ([place])   . Gastritis and gastroduodenitis   . Gastric ulcer without hemorrhage or perforation   . Gastroesophageal reflux disease with esophagitis   . Chronic hypotension 02/05/2018  . CKD (chronic kidney disease) 12/08/2017  . Ascites 12/08/2017  . Right heart failure (secondary to left heart failure) ([place]) 12/08/2017  . CAD (coronary artery disease), native coronary artery 03/27/2017  . Diabetes mellitus ([place]) 03/27/2017  . Dilated cardiomyopathy ([place])  03/27/2017  . Hyperlipidemia 03/27/2017  . Chronic systolic heart failure ([place]) 03/27/2017  . Ischemic cardiomyopathy 03/27/2017  . PVC (premature ventricular contraction) 07/17/2015    Palliative Care Assessment & Plan   Patient Profile:    Assessment:  62 year old with severe biventricular failure due to mixed ischemic/nonischemic cardiomyopathy/amyloidosis and CAD, cirrhosis with ascites requiring frequent paracenteses and CKD admitted 11/27 with progressive abdominal distention and weakness, AKI and cardiogenic shock.  Had 5 L paracentesis on 11/28.   Patient remains on Levophed.   Cardiogenic/septic shock. Acute kidney injury Presumed SBP   Recommendations/Plan:  Family meeting:    I met with the patient, his wife along with  PCCM colleague Salvadore Dom. We discussed about the patient's current condition. We discussed about the patient's serious underlying irreversible cardiac disease.  The patient states that he has had difficult but important conversations with his wife and children. At this time, he elects DO NOT RESUSCITATE/DO NOT INTUBATE.  We discussed about issues important to the patient. He was asking about going home. Currently, patient still on pressors. We discussed about continuing current mode of care as a time-limited trial for the next few days if possible so that the patient, while he is mentating normally, would be able to interact meaningfully with his family members were present at the bedside. Discussed frankly but compassionately that the patient may not be able to go home towards the end of this hospitalization.  Brief life review performed. Patient has immense importance for his faith which gives his life meaning and is very important to him. 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  Offered presence, supportive care while having discussions with the patient and the family as a unit. Palliative medicine team will  continue to follow.  Code Status:    Code Status Orders  (From admission, onward)  Start     Ordered   08/25/18 1040  Do not attempt resuscitation (DNR)  Continuous     08/25/18 1042        Code Status History    Date Active Date Inactive Code Status Order ID Comments User Context   08/20/2018 1611 08/25/2018 1042 Full Code [id number]  Desiree Hane, MD Inpatient   01/10/2018 1418 01/10/2018 2019 Full Code [id number]  Bensimhon, Shaune Pascal, MD Inpatient       Prognosis:   guarded   Discharge Planning:  To Be Determined  Care plan was discussed with   Patient, wife, PCCM  Thank you for allowing the Palliative Medicine Team to assist in the care of this patient.   Time In: 10 Time Out: 10.35 Total Time 35 Prolonged Time Billed  no       Greater than 50%  of this time was spent counseling and coordinating care related to the above assessment and plan.  Loistine Chance, MD [phone] Please contact Palliative Medicine Team phone at [phone] for questions and concerns.

## 2018-08-25 NOTE — Progress Notes (Signed)
62 year old with severe biventricular failure due to mixed ischemic/nonischemic cardiomyopathy/amyloidosis and CAD, cirrhosis with ascites requiring frequent paracenteses and CKD admitted 11/27 with progressive abdominal distention and weakness, AKI and cardiogenic shock.  Had 5 L paracentesis on 11/28 , results reviewed which shows 92% segs with 8000 WBCs, cultures negative  He remains on levo fed around 24 mics with arterial line reading 20 points higher.  Having a better day today per family at bedside, wife and daughters. Lucid, follows commands, distended abdomen, dull to percussion, wounds on both lower extremities, decreased breath sounds bilateral, S1-S2 tacky.  Labs reviewed -sodium appears to be dropping, creatinine slight increased from 2.8-2.9, leukocytosis persists Chest x-ray from 11/28 was reviewed which shows left basilar atelectasis.  Impression/plan Cardiogenic/septic shock-continue titrating down Levophed , midodrine to titrate to 10 mg 3 times daily, except systolic blood pressure of 80 and above Given overall prognosis, will not add vasopressin here At some point, arterial line will be discontinued  AKI /metabolic acidosis-not a candidate for dialysis, discontinue bicarbonate drip, continue oral bicarb  Presumed SBP -continue Zosyn until 12/3  DNR issued, patient and family more accepting of palliative efforts.  Will escalate as per their acceptance.  Not sure we will be able to completely wean off pressors  The patient is critically ill with multiple organ systems failure and requires high complexity decision making for assessment and support, frequent evaluation and titration of therapies, application of advanced monitoring technologies and extensive interpretation of multiple databases. Critical Care Time devoted to patient care services described in this note independent of APP/resident  time is 31 minutes.   Leanna Sato Elsworth Soho MD

## 2018-08-25 NOTE — Progress Notes (Signed)
Events of past day noted    Palliative care has seen the patient.   Note plans for short term continuation of pressors before pulling back. No new recommendations.   I have spoken with wife and will relay information to D Bensimhon.

## 2018-08-25 NOTE — Progress Notes (Signed)
NAME:  Cody Schwartz, MRN:  [id number], DOB:  10/02/1955, LOS: 5 ADMISSION DATE:  08/20/2018, CONSULTATION DATE: 08/13/2018 REFERRING MD:  Dr Darrick Meigs, CHIEF COMPLAINT: Sepsis  Brief History   Patient with a history of biventricular failure ejection fraction of 15% Pulmonary hypertension, liver cirrhosis, chronic kidney disease  Past Medical History  Biventricular heart failure, ICM, CAD, liver Cirrhosis, amyloid CM, CKD I  Hospital course   11/27: Presented with generalized weakness, hypotension; Known to have ascites 11/28: Did have a paracentesis performed-removal of 5 L of yellow fluid Acute on chronic kidney disease 08/23/18-complaining about a lot of nausea this morning 12/1 feels a little bit better today, still hypotensive, still on Levophed 12/2 still on pressors. Not weaned much but have been shooting for higher than baseline BP. He has now decided on DNR status but would like to cont Pressors as long as we can so that he can have more time w/ his family  Consults:  Cardiology Radiology Renal  Procedures:  Paracentesis 08/21/2018 Central line placement 08/21/2018  Significant Diagnostic Tests:   Micro Data:  Ascitic fluid 08/20/2018: neg Blood cultures 08/20/2018: neg  Antimicrobials:  Zosyn 11/27>> Vancomycin  11/27 >>11/28 Linezolid 11/29>>12/2   Interim history/subjective:  No pain, shortness or breath. Did  Have some nausea   Objective   Blood pressure (Abnormal) 81/57, pulse (Abnormal) 104, temperature 97.6 F (36.4 C), temperature source Axillary, resp. rate 14, height 5\' 8"  (1.727 m), weight 79.1 kg, SpO2 93 %. CVP:  [14 mmHg-18 mmHg] 14 mmHg      Intake/Output Summary (Last 24 hours) at 08/25/2018 0950 Last data filed at 08/25/2018 0600 Gross per 24 hour  Intake 2581.75 ml  Output 240 ml  Net 2341.75 ml   Filed Weights   08/23/18 0500 08/24/18 0500 08/25/18 0500  Weight: 73.7 kg 77.7 kg 79.1 kg    Examination: General: This is a  62 year old white male currently lying in bed he remains on high-dose vasoactive drip support HEENT normocephalic atraumatic mucous membranes are dry does have jugular venous distention Pulmonary: Diminished bases no accessory use Cardiac: Regular irregular atrial fibrillation on telemetry Abdomen: Slightly distended and firm shifting dullness to percussion GU: Minimal urine output Neuro: Awake, oriented, no focal deficits. Extremities: Mottled, diffuse areas of ecchymosis, generalized anasarca, large skin tear on left upper extremity  Resolved Hospital Problem list    Assessment & Plan:   Sepsis presumed 2/2 SBP (NOS) Plan Dc zyvox Stop zosyn at day 7  Multi-organ failure w/ on-going hypotension. At this point etiology cardiogenic shock in setting of severe Biventricular heart failure (stage D; EF 15%) w/ Pulmonary HTN in setting of amyloid CM -baseline BP 85 (s) -norepi needs: Still high Plan Continue telemetry monitoring Now DO NOT RESUSCITATE and DO NOT INTUBATE Titrate norepinephrine for systolic blood pressure greater than 85 Keep fluids at [facility]  Chronic Afib Plan Continue telemetry monitoring  Acute on Chronic renal failure (CKD 3) 2/2 cardiorenal syndrome  -cr rising -UOP:  Plan Monitor urine output Titrating norepinephrine as mentioned above  Anion gap metabolic acidosis-> in setting of persistent shock and organ hypoperfusion  Plan Follow-up a.m. chemistry  Fluid and electrolyte imbalance: hyponatremia, secondary to heart failure and renal dysfunction Plan Trend chemistry  Liver cirrhosis Plan PRN paracentesis for comfort  Hypothyroidism  Plan Continue replacement  Best practice:  Diet: Regular diet DVT prophylaxis: Subcu heparin GI prophylaxis: Protonix Mobility: Bedrest Code Status: DO NOT RESUSCITATE Family Communication: Discussed with daughter at bedside today Disposition:  Continue intensive care services with titration of norepinephrine  drip.  He remains critically ill due to refractory biventricular heart failure and cardiogenic shock.  He is now DO NOT RESUSCITATE  Erick Colace ACNP-BC [place] Pager # [phone] OR # [phone] if no answer

## 2018-08-26 DIAGNOSIS — I130 Hypertensive heart and chronic kidney disease with heart failure and stage 1 through stage 4 chronic kidney disease, or unspecified chronic kidney disease: Secondary | ICD-10-CM

## 2018-08-26 LAB — BASIC METABOLIC PANEL
Anion gap: 14 (ref 5–15)
BUN: 75 mg/dL — ABNORMAL HIGH (ref 8–23)
CO2: 24 mmol/L (ref 22–32)
Calcium: 7 mg/dL — ABNORMAL LOW (ref 8.9–10.3)
Chloride: 86 mmol/L — ABNORMAL LOW (ref 98–111)
Creatinine, Ser: 3.51 mg/dL — ABNORMAL HIGH (ref 0.61–1.24)
GFR calc Af Amer: 20 mL/min — ABNORMAL LOW (ref >60)
GFR calc non Af Amer: 17 mL/min — ABNORMAL LOW (ref >60)
Glucose, Bld: 167 mg/dL — ABNORMAL HIGH (ref 70–99)
Potassium: 3.6 mmol/L (ref 3.5–5.1)
SODIUM: 124 mmol/L — AB (ref 135–145)

## 2018-08-26 LAB — CULTURE, BODY FLUID W GRAM STAIN -BOTTLE: Culture: NO GROWTH

## 2018-08-26 LAB — CULTURE, BODY FLUID-BOTTLE

## 2018-08-26 MED ORDER — ACETAMINOPHEN 325 MG PO TABS
650.0000 mg | ORAL_TABLET | ORAL | Status: DC | PRN
  Administered 2018-08-26 (×2): 650 mg via ORAL
  Filled 2018-08-26 (×2): qty 2

## 2018-08-26 MED ORDER — TRAMADOL HCL 50 MG PO TABS: 100.0000 mg | ORAL_TABLET | Freq: Two times a day (BID) | ORAL | Status: DC

## 2018-08-26 NOTE — Progress Notes (Signed)
Daily Progress Note   Patient Name: Cody Schwartz       Date: 08/26/2018 DOB: [birth year]  Age: 62 y.o. MRN#: [id number] Attending Physician: Laurin Coder, MD Primary Care Physician: [name]., MD Admit Date: 08/20/2018  Reason for Consultation/Follow-up: Establishing goals of care  Subjective: Cody Schwartz is awake, alert, and resting in bed.  He reports discussing this AM with PCCM.  Denies any needs or questions. His wife is at bedside as well as members of his church.  His wife reports that church members have been coming to visit and are planning to visit throughout the day. See below   Length of Stay: 6  Current Medications: Scheduled Meds:  . Chlorhexidine Gluconate Cloth  6 each Topical Daily  . collagenase   Topical Daily  . mouth rinse  15 mL Mouth Rinse BID  . midodrine  10 mg Oral TID WC  . pantoprazole (PROTONIX) IV  40 mg Intravenous Q12H  . sodium bicarbonate  650 mg Oral BID  . sodium chloride flush  10-40 mL Intracatheter Q12H  . traMADol  100 mg Oral Q12H    Continuous Infusions: . sodium chloride Stopped (08/23/18 1224)  . sodium chloride    . norepinephrine (LEVOPHED) Adult infusion 11 mcg/min (08/26/18 1200)    PRN Meds: sodium chloride, acetaminophen, metoCLOPramide (REGLAN) injection, nitroGLYCERIN, ondansetron **OR** ondansetron (ZOFRAN) IV, senna-docusate, sodium chloride flush  Physical Exam         Awake alert Appears frail + abdominal distension + edema Diminished breath sounds S 1 S 2   Vital Signs: BP (!) 93/57   Pulse (!) 105   Temp 97.7 F (36.5 C) (Oral)   Resp 19   Ht 5\' 8"  (1.727 m)   Wt 79.1 kg   SpO2 96%   BMI 26.51 kg/m  SpO2: SpO2: 96 % O2 Device: O2 Device: Room Air O2 Flow Rate: O2 Flow Rate (L/min): 2  L/min  Intake/output summary:   Intake/Output Summary (Last 24 hours) at 08/26/2018 1446 Last data filed at 08/26/2018 1200 Gross per 24 hour  Intake 576.52 ml  Output 100 ml  Net 476.52 ml   LBM: Last BM Date: 08/24/18 Baseline Weight: Weight: 78.6 kg Most recent weight: Weight: 79.1 kg      PPS 30% Palliative Assessment/Data:  Flowsheet Rows     Most Recent Value  Intake Tab  Referral Department  Critical care  Unit at Time of Referral  ICU  Palliative Care Primary Diagnosis  Cardiac  Date Notified  08/22/18  Palliative Care Type  New Palliative care  Reason for referral  Clarify Goals of Care  Date of Admission  08/20/18  Date first seen by Palliative Care  08/23/18  # of days Palliative referral response time  1 Day(s)  # of days IP prior to Palliative referral  2  Clinical Assessment  Psychosocial & Spiritual Assessment  Palliative Care Outcomes      Patient Active Problem List   Diagnosis Date Noted  . Weakness   . Palliative care by specialist   . Goals of care, counseling/discussion   . AKI (acute kidney injury) ([place])   . SBP (spontaneous bacterial peritonitis) ([place])   . Sepsis ([place]) 08/20/2018  . Hyperkalemia, diminished renal excretion 08/20/2018  . Pressure injury of skin 08/20/2018  . Hyponatremia 08/20/2018  . Atrial fibrillation, chronic 08/20/2018  . Cirrhosis of liver with ascites ([place])   . Gastritis and gastroduodenitis   . Gastric ulcer without hemorrhage or perforation   . Gastroesophageal reflux disease with esophagitis   . Chronic hypotension 02/05/2018  . CKD (chronic kidney disease) 12/08/2017  . Ascites 12/08/2017  . Right heart failure (secondary to left heart failure) ([place]) 12/08/2017  . CAD (coronary artery disease), native coronary artery 03/27/2017  . Diabetes mellitus ([place]) 03/27/2017  . Dilated cardiomyopathy ([place]) 03/27/2017  . Hyperlipidemia 03/27/2017  . Chronic systolic heart failure ([place]) 03/27/2017  . Ischemic  cardiomyopathy 03/27/2017  . PVC (premature ventricular contraction) 07/17/2015    Palliative Care Assessment & Plan   Patient Profile:    Assessment:  62 year old with severe biventricular failure due to mixed ischemic/nonischemic cardiomyopathy/amyloidosis and CAD, cirrhosis with ascites requiring frequent paracenteses and CKD admitted 11/27 with progressive abdominal distention and weakness, AKI and cardiogenic shock.  Had 5 L paracentesis on 11/28.   Patient remains on pressor support   Cardiogenic/septic shock. Acute kidney injury Presumed SBP   Recommendations/Plan: Cody Schwartz reports discussing this AM with PCCM and had no questions.  His wife asked to review plan in the hall.  Spoke with his wife and Physiological scientist.  Currently, patient is still on pressors with plan to continue current mode of care through tomorrow at which point will plan for further conversation regarding pressor support and if it continues to support his goals.  Palliative medicine team will continue to follow.  Code Status:    Code Status Orders  (From admission, onward)         Start     Ordered   08/25/18 1040  Do not attempt resuscitation (DNR)  Continuous     08/25/18 1042        Code Status History    Date Active Date Inactive Code Status Order ID Comments User Context   08/20/2018 1611 08/25/2018 1042 Full Code [id number]  Desiree Hane, MD Inpatient   01/10/2018 1418 01/10/2018 2019 Full Code [id number]  Bensimhon, Shaune Pascal, MD Inpatient       Prognosis:   guarded   Discharge Planning:  To Be Determined  Care plan was discussed with   Patient, wife, PCCM  Thank you for allowing the Palliative Medicine Team to assist in the care of this patient.   Time In: 1315 Time Out: 1340 Total Time 25 Prolonged Time Billed  no  Greater than 50%  of this time was spent counseling and coordinating care related to the above assessment and plan.  Micheline Rough, MD [place] Team [phone]

## 2018-08-26 NOTE — Progress Notes (Signed)
NAME:  Cody Schwartz, MRN:  [id number], DOB:  03-06-1956, LOS: 6 ADMISSION DATE:  08/20/2018, CONSULTATION DATE: 08/13/2018 REFERRING MD:  Dr Darrick Meigs, CHIEF COMPLAINT: Sepsis  Brief History   Patient with a history of biventricular failure ejection fraction of 15% Pulmonary hypertension, liver cirrhosis, chronic kidney disease  Past Medical History  Biventricular heart failure, ICM, CAD, liver Cirrhosis, amyloid CM, CKD I  Hospital course   11/27: Presented with generalized weakness, hypotension; Known to have ascites 11/28: Did have a paracentesis performed-removal of 5 L of yellow fluid Acute on chronic kidney disease 08/23/18-complaining about a lot of nausea this morning 12/1 feels a little bit better today, still hypotensive, still on Levophed 12/2 still on pressors. Not weaned much but have been shooting for higher than baseline BP. He has now decided on DNR status but would like to cont Pressors as long as we can so that he can have more time w/ his family  12/3: Pressor requirements diminished.  He has requested we not go back up on pressors and is requesting de-escalation of some of his medications. Consults:  Cardiology Radiology Renal  Procedures:  Paracentesis 08/21/2018 Central line placement 08/21/2018  Significant Diagnostic Tests:   Micro Data:  Ascitic fluid 08/20/2018: neg Blood cultures 08/20/2018: neg  Antimicrobials:  Zosyn 11/27>> 12/3 Vancomycin  11/27 >>11/28 Linezolid 11/29>>12/2   Interim history/subjective:  Was having some intermittent leg discomfort last night.  Denies shortness of breath, chest pain, or nausea.  Objective   Blood pressure (Abnormal) 81/57, pulse 90, temperature (Abnormal) 96.4 F (35.8 C), temperature source Axillary, resp. rate 12, height 5\' 8"  (1.727 m), weight 79.1 kg, SpO2 97 %.        Intake/Output Summary (Last 24 hours) at 08/26/2018 0901 Last data filed at 08/26/2018 0500 Gross per 24 hour  Intake 794.86 ml    Output 100 ml  Net 694.86 ml   Filed Weights   08/23/18 0500 08/24/18 0500 08/25/18 0500  Weight: 73.7 kg 77.7 kg 79.1 kg    Examination: General: 62 year old white male resting in bed no acute distress this morning HEENT: Normocephalic atraumatic mucous membranes moist.  Sclera slightly icteric Pulmonary: Diminished bases no accessory use Cardiac: Regular irregular no murmur rub or gallop Abdomen: Denies discomfort slightly firm and distended hypoactive Extremities: Wrapped lower extremities mottled skin, dependent edema with weeping extremities Neuro: Awake oriented no focal deficits GU: Decreasing urine output, concentrated urine  Resolved Hospital Problem list    Assessment & Plan:   Sepsis presumed 2/2 SBP (NOS) Plan DC all antibiotics  Multi-organ failure w/ on-going hypotension. At this point etiology cardiogenic shock in setting of severe Biventricular heart failure (stage D; EF 15%) w/ Pulmonary HTN in setting of amyloid CM -baseline BP 85 (s) -norepi needs: Still high Plan Weaning norepinephrine for mean arterial pressure greater than 85 KVO IV fluids Telemetry  Chronic Afib Plan Telemetry monitoring  Acute on Chronic renal failure (CKD 3) 2/2 cardiorenal syndrome  -cr rising -UOP:  Plan Monitor urine output, limit labs  Anion gap metabolic acidosis-> in setting of persistent shock and organ hypoperfusion  Plan Limit labs  Fluid and electrolyte imbalance: hyponatremia, secondary to heart failure and renal dysfunction Plan Continue monitor urine output  Liver cirrhosis Plan PRN paracentesis  Hypothyroidism  Plan He has requested I stop Synthroid  Best practice:  Diet: Regular diet DVT prophylaxis: Subcu heparin GI prophylaxis: Protonix Mobility: Bedrest Code Status: DO NOT RESUSCITATE Family Communication: Discussed with daughter at bedside today  Disposition: Continue intensive care services with titration of norepinephrine drip.  He is  requesting that we de-escalate any POs not needed   Erick Colace ACNP-BC [place] Pager # [phone] OR # [phone] if no answer

## 2018-08-27 NOTE — Progress Notes (Addendum)
   NAME:  Cody Schwartz, MRN:  [id number], DOB:  1956-01-07, LOS: 7 ADMISSION DATE:  08/20/2018, CONSULTATION DATE: 08/13/2018 REFERRING MD:  Dr Darrick Meigs, CHIEF COMPLAINT: Sepsis  Brief History   Patient with a history of biventricular failure ejection fraction of 15% Pulmonary hypertension, liver cirrhosis, chronic kidney disease  Past Medical History  Biventricular heart failure, ICM, CAD, liver Cirrhosis, amyloid CM, CKD I  Hospital course   11/27: Presented with generalized weakness, hypotension; Known to have ascites 11/28: Did have a paracentesis performed-removal of 5 L of yellow fluid Acute on chronic kidney disease 08/23/18-complaining about a lot of nausea this morning 12/1 feels a little bit better today, still hypotensive, still on Levophed 12/2 still on pressors. Not weaned much but have been shooting for higher than baseline BP. He has now decided on DNR status but would like to cont Pressors as long as we can so that he can have more time w/ his family  12/3: Pressor requirements diminished.  He has requested we not go back up on pressors and is requesting de-escalation of some of his medications. 12/4 off levo   Consults:  Cardiology Radiology Renal  Procedures:  Paracentesis 08/21/2018 Central line placement 08/21/2018  Significant Diagnostic Tests:   Micro Data:  Ascitic fluid 08/20/2018: neg Blood cultures 08/20/2018: neg  Antimicrobials:  Zosyn 11/27>> 12/3 Vancomycin  11/27 >>11/28 Linezolid 11/29>>12/2   Interim history/subjective:   Slept well overnight No pain, dyspnea afebrile  Objective   Blood pressure (!) 93/57, pulse 86, temperature (!) 97 F (36.1 C), temperature source Axillary, resp. rate 11, height 5\' 8"  (1.727 m), weight 79.1 kg, SpO2 93 %.        Intake/Output Summary (Last 24 hours) at 08/27/2018 1339 Last data filed at 08/27/2018 1200 Gross per 24 hour  Intake 198.34 ml  Output -  Net 198.34 ml   Filed Weights   08/23/18  0500 08/24/18 0500 08/25/18 0500  Weight: 73.7 kg 77.7 kg 79.1 kg    Examination: General: resting in bed no acute distress, interactive HEENT: no pallor, no icterus Pulmonary:decreased BL, no rhonchi Cardiac:s1s2 distant no murmur rub or gallop Abdomen:  distended hypoactive Extremities:wounds Wrapped lower extremities mottled skin, dependent edema with weeping extremities Neuro: Awake oriented no focal deficits   Resolved Hospital Problem list    Assessment & Plan:   Sepsis presumed 2/2 SBP (NOS) Plan Completed zosyn for presumed SBP  Multi-organ failure w/ on-going hypotension. At this point etiology cardiogenic shock in setting of severe Biventricular heart failure (stage D; EF 15%) w/ Pulmonary HTN in setting of amyloid CM -baseline BP 85 (s) -norepi needs: Still high Plan Dc levophed & do not restart Dc a -line Ct midodrine   Chronic Afib Plan Telemetry monitoring  AKI on Chronic renal failure (CKD 3) 2/2 cardiorenal syndrome  -cr rising Hyponatremia Plan Limit labs at this point Ct oral bicarb   Liver cirrhosis Plan PRN paracentesis  Hypothyroidism  Plan stop Synthroid at his request  Best practice:  Diet: Regular diet DVT prophylaxis: Subcu heparin GI prophylaxis: Protonix Mobility: Bedrest Code Status: DO NOT RESUSCITATE Family Communication: Discussed with wife & daughter at bedside  Disposition:  Transfer to floor , will ask triad to assume care, ? Home hospice eventually, palliative following   Kara Mead MD. FCCP. [facility] Pager [phone] If no response call [phone]   08/27/2018

## 2018-08-28 MED ORDER — ORAL CARE MOUTH RINSE: 15.0000 mL | Freq: Two times a day (BID) | OROMUCOSAL | 0 refills | Status: AC

## 2018-08-28 MED ORDER — TRAMADOL HCL 50 MG PO TABS: 100.0000 mg | ORAL_TABLET | Freq: Two times a day (BID) | ORAL | Status: AC

## 2018-08-28 MED ORDER — ALBUMIN HUMAN 25 % IV SOLN: 25.0000 g | Freq: Four times a day (QID) | INTRAVENOUS | Status: DC

## 2018-08-28 MED ORDER — SENNOSIDES-DOCUSATE SODIUM 8.6-50 MG PO TABS: 1.0000 | ORAL_TABLET | Freq: Every evening | ORAL | Status: AC | PRN

## 2018-08-28 MED ORDER — METOCLOPRAMIDE HCL 5 MG/ML IJ SOLN: 5.0000 mg | Freq: Three times a day (TID) | INTRAMUSCULAR | 0 refills | Status: AC | PRN

## 2018-08-28 MED ORDER — NITROGLYCERIN 0.4 MG SL SUBL: 0.4000 mg | SUBLINGUAL_TABLET | SUBLINGUAL | 12 refills | Status: AC | PRN

## 2018-08-28 MED ORDER — ONDANSETRON HCL 4 MG PO TABS: 4.0000 mg | ORAL_TABLET | Freq: Four times a day (QID) | ORAL | 0 refills | Status: AC | PRN

## 2018-08-28 MED ORDER — COLLAGENASE 250 UNIT/GM EX OINT: TOPICAL_OINTMENT | Freq: Every day | CUTANEOUS | 0 refills | Status: AC

## 2018-08-28 MED ORDER — PANTOPRAZOLE SODIUM 40 MG PO TBEC: 40.0000 mg | DELAYED_RELEASE_TABLET | Freq: Every day | ORAL | 1 refills | Status: AC

## 2018-08-28 NOTE — Progress Notes (Signed)
Daily Progress Note   Patient Name: Cody Schwartz       Date: 08/28/2018 DOB: May 09, 1956  Age: 62 y.o. MRN#: [id number] Attending Physician: Charlynne Cousins, MD Primary Care Physician: [name]., MD Admit Date: 08/20/2018  Reason for Consultation/Follow-up: Establishing goals of care  Subjective: Cody Schwartz is awake, alert, and resting in bed.    Wife present at the bedside.  Report that they are hopeful that he can be transferred to residential hospice facility in [facility].  Discussed plan for transfer including risk that he may die in transport (BP has been low, but he looks stable for transport on my exam).  Both he and his wife are at peace with this and would like to proceed with plan to transfer as soon as possible.   Length of Stay: 8  Current Medications: Scheduled Meds:  . Chlorhexidine Gluconate Cloth  6 each Topical Daily  . collagenase   Topical Daily  . mouth rinse  15 mL Mouth Rinse BID  . midodrine  10 mg Oral TID WC  . pantoprazole (PROTONIX) IV  40 mg Intravenous Q12H  . sodium bicarbonate  650 mg Oral BID  . sodium chloride flush  10-40 mL Intracatheter Q12H  . traMADol  100 mg Oral Q12H    Continuous Infusions: . albumin human      PRN Meds: metoCLOPramide (REGLAN) injection, nitroGLYCERIN, ondansetron **OR** ondansetron (ZOFRAN) IV, senna-docusate, sodium chloride flush  Physical Exam         Awake alert Appears frail + abdominal distension + edema Diminished breath sounds S 1 S 2   Vital Signs: BP (!) 70/44 (BP Location: Left Arm)   Pulse 93   Temp (!) 97 F (36.1 C) (Axillary)   Resp 12   Ht 5\' 8"  (1.727 m)   Wt 79.1 kg   SpO2 95%   BMI 26.51 kg/m  SpO2: SpO2: 95 % O2 Device: O2 Device: Nasal Cannula O2 Flow Rate:  O2 Flow Rate (L/min): 2 L/min  Intake/output summary:   Intake/Output Summary (Last 24 hours) at 08/28/2018 1441 Last data filed at 08/28/2018 0651 Gross per 24 hour  Intake -  Output 300 ml  Net -300 ml   LBM: Last BM Date: 08/28/18 Baseline Weight: Weight: 78.6 kg Most recent weight: Weight: 79.1 kg  PPS 30% Palliative Assessment/Data:    Flowsheet Rows     Most Recent Value  Intake Tab  Referral Department  Critical care  Unit at Time of Referral  ICU  Palliative Care Primary Diagnosis  Cardiac  Date Notified  08/22/18  Palliative Care Type  New Palliative care  Reason for referral  Clarify Goals of Care  Date of Admission  08/20/18  Date first seen by Palliative Care  08/23/18  # of days Palliative referral response time  1 Day(s)  # of days IP prior to Palliative referral  2  Clinical Assessment  Psychosocial & Spiritual Assessment  Palliative Care Outcomes      Patient Active Problem List   Diagnosis Date Noted  . Weakness   . Palliative care by specialist   . Goals of care, counseling/discussion   . AKI (acute kidney injury) ([place])   . SBP (spontaneous bacterial peritonitis) ([place])   . Sepsis ([place]) 08/20/2018  . Hyperkalemia, diminished renal excretion 08/20/2018  . Pressure injury of skin 08/20/2018  . Hyponatremia 08/20/2018  . Atrial fibrillation, chronic 08/20/2018  . Cirrhosis of liver with ascites ([place])   . Gastritis and gastroduodenitis   . Gastric ulcer without hemorrhage or perforation   . Gastroesophageal reflux disease with esophagitis   . Chronic hypotension 02/05/2018  . CKD (chronic kidney disease) 12/08/2017  . Ascites 12/08/2017  . Right heart failure (secondary to left heart failure) ([place]) 12/08/2017  . CAD (coronary artery disease), native coronary artery 03/27/2017  . Diabetes mellitus ([place]) 03/27/2017  . Dilated cardiomyopathy ([place]) 03/27/2017  . Hyperlipidemia 03/27/2017  . Chronic systolic heart failure ([place]) 03/27/2017  .  Ischemic cardiomyopathy 03/27/2017  . PVC (premature ventricular contraction) 07/17/2015    Palliative Care Assessment & Plan   Assessment:  62 year old with severe biventricular failure due to mixed ischemic/nonischemic cardiomyopathy/amyloidosis and CAD, cirrhosis with ascites requiring frequent paracenteses and CKD admitted 11/27 with presumed SBP with progressive abdominal distention and weakness, AKI and cardiogenic shock.  He is now off pressors and remains hypotensive.  Plan moving forward is for full comfort care.  Recommendations/Plan: Cody Schwartz reports strong desire to transition to residential hospice in [facility] for EOL care.  Consult placed to liaison to come and meet with family.  Code Status:    Code Status Orders  (From admission, onward)         Start     Ordered   08/25/18 1040  Do not attempt resuscitation (DNR)  Continuous     08/25/18 1042        Code Status History    Date Active Date Inactive Code Status Order ID Comments User Context   08/20/2018 1611 08/25/2018 1042 Full Code [id number]  Desiree Hane, MD Inpatient   01/10/2018 1418 01/10/2018 2019 Full Code [id number]  Bensimhon, Shaune Pascal, MD Inpatient       Prognosis:  I believe that Cody Schwartz prognosis, if his disease follows its natural course, is less than 2 weeks and he would be best served by transition to residential hospice for end of life care if it can be arranged.  Discharge Planning:  To Be Determined  Care plan was discussed with   Patient, wife, hospice liaison, RN  Thank you for allowing the Palliative Medicine Team to assist in the care of this patient.   Time In: 1415 Time Out: 1440 Total Time 25 Prolonged Time Billed  no       Greater than 50%  of this  time was spent counseling and coordinating care related to the above assessment and plan.  Micheline Rough, MD [facility] [phone]

## 2018-08-28 NOTE — Progress Notes (Signed)
CSW consulted for residential hospice placement - [facility].  Patient has been accepted to hospice for transfer as soon as d/c.  CSW placed packet on chart with medical necessity, facesheet, and DNR.  RN call report when discharge order is in: [phone].    Pricilla Holm, MSW, [employer] Clinical Social Work [phone] (Weekend)

## 2018-08-28 NOTE — Progress Notes (Signed)
IR requested for possible image-guided paracentesis.  RN reports patient is on comfort care with BP 77/40.  Informed Dr. Aileen Fass that due to this low blood pressure, procedure will not occur today. Informed Dr. Aileen Fass that if BP corrected, we are available for procedure in future. States that patient is comfort care, so requested procedure canceled.  IR available in future if needed.  Bea Graff Paislea Hatton, PA-C 08/28/2018, 10:57 AM

## 2018-08-28 NOTE — Progress Notes (Addendum)
[facility] PROGRESS NOTE    Progress Note  TERALD JUMP  HBZ:[id number] DOB: 1956/05/14 DOA: 08/20/2018 PCP: [name]., MD     Brief Narrative:   Cody Schwartz is an 62 y.o. male has medical history of biventricular failure with an EF of 15%, CAD status post PCI with stent placement pulmonary hypertension, liver cirrhosis and chronic kidney disease.  Scented with generalized weakness, hypotension and ascites, paracentesis was done 08/21/2018 that yielded 5 L of fluid.  Cardiology and nephrology were consulted he was started empirically on VAC and Zosyn on admission, pulmonary and critical care were consulted on 08/22/2018 persistent hypotension  Assessment/Plan:   Sepsis assumed to be secondary to spontaneous bacterial peritonitis: He completed Zosyn course.  Multiorgan failure with ongoing hypotension/cardiogenic shock in the setting of biventricular failure and pulmonary hypertension in the setting of amyloid cardiomyopathy: Critical care and palliative care had a long meeting with the family they decided to move towards hospice care. Palliative Care to meet with family today to see how we proceed whether to home or to a residential hospice facility. Pressors were stopped he was made a DNR/DNI. He was continue midodrine. A-line and telemetry was discontinued. Currently on no heart failure medications. He would like to continue PPI as it makes him feel better.  Chronic atrial fibrillation: We will discontinue telemetry.  Acute on chronic kidney disease stage III secondary to cardiorenal syndrome: Creatinine continues to rise. Family and patient do not want any further labs. He was continued on oral bicarbonate.  Liver cirrhosis: That is post paracentesis.  Hypothyroidism: Synthroid was stopped.  Atrial fibrillation, chronic  Palliative care by specialist/Goals of care, counseling/discussion  RN Pressure Injury Documentation: Pressure Injury  08/20/18 Stage II -  Partial thickness loss of dermis presenting as a shallow open ulcer with a red, pink wound bed without slough. (Active)  08/20/18 1606   Location: Coccyx  Location Orientation:   Staging: Stage II -  Partial thickness loss of dermis presenting as a shallow open ulcer with a red, pink wound bed without slough.  Wound Description (Comments):   Present on Admission: Yes     DVT prophylaxis: snd Family Communication:wife Disposition Plan/Barrier to D/C: transfer to med surg Code Status:     Code Status Orders  (From admission, onward)         Start     Ordered   08/25/18 1040  Do not attempt resuscitation (DNR)  Continuous     08/25/18 1042        Code Status History    Date Active Date Inactive Code Status Order ID Comments User Context   08/20/2018 1611 08/25/2018 1042 Full Code [id number]  Desiree Hane, MD Inpatient   01/10/2018 1418 01/10/2018 2019 Full Code [id number]  Bensimhon, Shaune Pascal, MD Inpatient        IV Access:    Peripheral IV   Procedures and diagnostic studies:   No results found.   Medical Consultants:    None.  Anti-Infectives:   None  Subjective:    Cody Schwartz he relates he feels better today sitting up in bed may be some stomach upset.  He is hungry and would like to try food.  Objective:    Vitals:   08/27/18 1600 08/27/18 1700 08/27/18 1800 08/27/18 2000  BP: (!) 66/40     Pulse: 84 77 90 68  Resp: 11 12 10 14   TempSrc:      SpO2: 100% 96% 96% 97%  Weight:      Height:        Intake/Output Summary (Last 24 hours) at 08/28/2018 0908 Last data filed at 08/28/2018 0651 Gross per 24 hour  Intake 9.79 ml  Output 300 ml  Net -290.21 ml   Filed Weights   08/23/18 0500 08/24/18 0500 08/25/18 0500  Weight: 73.7 kg 77.7 kg 79.1 kg    Exam: General exam: In no acute distress. Respiratory system: Good air movement and clear to auscultation. Cardiovascular system: S1 & S2 heard, RRR. No  JVD. Gastrointestinal system: Abdomen is nondistended, soft, distended with positive fluid wave Central nervous system: Alert and oriented. No focal neurological deficits. Extremities: 3+ edema Skin: multiple bruises in arms bilaterally Psychiatry: Judgement and insight appear normal. Mood & affect appropriate.    Data Reviewed:    Labs: Basic Metabolic Panel: Recent Labs  Lab 08/22/18 0338  08/23/18 0830 08/24/18 0500 08/25/18 0356 08/26/18 0546  NA 127*  --  129* 126* 125* 124*  K 7.1*   < > 4.1 3.8 3.8 3.6  CL 98  --  94* 88* 87* 86*  CO2 13*  --  19* 21* 21* 24  GLUCOSE 186*  --  165* 182* 203* 167*  BUN 84*  --  71* 72* 75* 75*  CREATININE 3.29*  --  2.80* 2.80* 2.97* 3.51*  CALCIUM 7.9*  --  7.6* 7.2* 7.3* 7.0*   < > = values in this interval not displayed.   GFR Estimated Creatinine Clearance: 20.6 mL/min (A) (by C-G formula based on SCr of 3.51 mg/dL (H)). Liver Function Tests: Recent Labs  Lab 08/22/18 0338  AST 34  ALT 22  ALKPHOS 105  BILITOT 2.7*  PROT 5.7*  ALBUMIN 2.6*   No results for input(s): LIPASE, AMYLASE in the last 168 hours. No results for input(s): AMMONIA in the last 168 hours. Coagulation profile No results for input(s): INR, PROTIME in the last 168 hours.  CBC: Recent Labs  Lab 08/22/18 0338  WBC 24.4*  HGB 11.4*  HCT 35.6*  MCV 98.6  PLT 188   Cardiac Enzymes: No results for input(s): CKTOTAL, CKMB, CKMBINDEX, TROPONINI in the last 168 hours. BNP (last 3 results) Recent Labs    10/28/17 1155  PROBNP 15,013*   CBG: No results for input(s): GLUCAP in the last 168 hours. D-Dimer: No results for input(s): DDIMER in the last 72 hours. Hgb A1c: No results for input(s): HGBA1C in the last 72 hours. Lipid Profile: No results for input(s): CHOL, HDL, LDLCALC, TRIG, CHOLHDL, LDLDIRECT in the last 72 hours. Thyroid function studies: No results for input(s): TSH, T4TOTAL, T3FREE, THYROIDAB in the last 72 hours.  Invalid  input(s): FREET3 Anemia work up: No results for input(s): VITAMINB12, FOLATE, FERRITIN, TIBC, IRON, RETICCTPCT in the last 72 hours. Sepsis Labs: Recent Labs  Lab 08/22/18 0338  WBC 24.4*   Microbiology Recent Results (from the past 240 hour(s))  Blood Culture (routine x 2)     Status: None   Collection Time: 08/20/18  1:51 PM  Result Value Ref Range Status   Specimen Description   Final    RIGHT ANTECUBITAL Performed at [facility] [address] [place], [place] [address]    Special Requests   Final    BOTTLES DRAWN AEROBIC AND ANAEROBIC Blood Culture adequate volume Performed at [facility] [address] [place], [place] [address]    Culture   Final    NO GROWTH 5 DAYS Performed  at [facility], [place] [address], [place], [place] [address]    Report Status 08/25/2018 FINAL  Final  Blood Culture (routine x 2)     Status: None   Collection Time: 08/20/18  1:51 PM  Result Value Ref Range Status   Specimen Description   Final    LEFT ANTECUBITAL Performed at [facility] [address], [place], [place] [address]    Special Requests   Final    BOTTLES DRAWN AEROBIC AND ANAEROBIC Blood Culture adequate volume Performed at [facility] [address], [place], [place] [address]    Culture   Final    NO GROWTH 5 DAYS Performed at [facility], [place] [address], [place], [place] [address]    Report Status 08/25/2018 FINAL  Final  MRSA PCR Screening     Status: None   Collection Time: 08/20/18  4:12 PM  Result Value Ref Range Status   MRSA by PCR NEGATIVE NEGATIVE Final    Comment:        The GeneXpert MRSA Assay (FDA approved for NASAL specimens only), is one component of a comprehensive MRSA colonization surveillance program. It is not intended to diagnose MRSA infection nor to guide or monitor treatment for MRSA infections. Performed at [facility],  [place] [address], [place], [place] [address]   Fungus Culture With Stain     Status: None (Preliminary result)   Collection Time: 08/21/18  9:31 AM  Result Value Ref Range Status   Fungus Stain Final report  Final    Comment: (NOTE) Performed At: [facility] [place], [place] [id number] Rush Farmer MD OZ:[id number]    Fungus (Mycology) Culture PENDING  Incomplete   Fungal Source PERITONEAL  Final    Comment: Performed at [facility], [place] [address] [place], [place] [address]  Culture, body fluid-bottle     Status: None   Collection Time: 08/21/18  9:31 AM  Result Value Ref Range Status   Specimen Description FLUID PERITONEAL  Final   Special Requests NONE  Final   Culture   Final    NO GROWTH 5 DAYS Performed at [facility], [address], [place], [place] [address]    Report Status 08/26/2018 FINAL  Final  Gram stain     Status: None   Collection Time: 08/21/18  9:31 AM  Result Value Ref Range Status   Specimen Description FLUID PERITONEAL  Final   Special Requests NONE  Final   Gram Stain   Final    CYTOSPIN SMEAR WBC PRESENT,BOTH PMN AND MONONUCLEAR NO ORGANISMS SEEN Performed at [facility], [place] [address] [place], [place] [address]    Report Status 08/21/2018 FINAL  Final  Fungus Culture Result     Status: None   Collection Time: 08/21/18  9:31 AM  Result Value Ref Range Status   Result 1 Comment  Final    Comment: (NOTE) KOH/Calcofluor preparation:  no fungus observed. Performed At: [facility] [place], [place] [id number] Rush Farmer MD YT:[id number]      Medications:   . Chlorhexidine Gluconate Cloth  6 each Topical Daily  . collagenase   Topical Daily  . mouth rinse  15 mL Mouth Rinse BID  . midodrine  10 mg Oral TID WC  . pantoprazole (PROTONIX) IV  40 mg Intravenous Q12H  . sodium bicarbonate  650 mg Oral BID  . sodium  chloride flush  10-40 mL Intracatheter Q12H   . traMADol  100 mg Oral Q12H   Continuous Infusions: . sodium chloride Stopped (08/23/18 1224)  . sodium chloride       LOS: 8 days   Charlynne Cousins  [facility]   *Please refer to [email or web address], password [email or web address] to get updated schedule on who will round on this patient, as hospitalists switch teams weekly. If 7PM-7AM, please contact night-coverage at [email or web address], password [email or web address] for any overnight needs.  08/28/2018, 9:08 AM

## 2018-08-29 DIAGNOSIS — I5042 Chronic combined systolic (congestive) and diastolic (congestive) heart failure: Secondary | ICD-10-CM | POA: Diagnosis not present

## 2018-08-29 DIAGNOSIS — I5082 Biventricular heart failure: Secondary | ICD-10-CM | POA: Diagnosis not present

## 2018-08-29 DIAGNOSIS — I255 Ischemic cardiomyopathy: Secondary | ICD-10-CM | POA: Diagnosis not present

## 2018-08-29 DIAGNOSIS — Z515 Encounter for palliative care: Secondary | ICD-10-CM | POA: Diagnosis not present

## 2018-08-29 NOTE — Discharge Summary (Signed)
Physician Discharge Summary         Patient ID: Cody Schwartz MRN: [id number] DOB/AGE: Feb 13, 1956 62 y.o.  Admit date: 08/20/2018 Discharge date: 08/29/2018  Discharge Diagnoses:   Sepsis Spontaneous bacterial Peritonitis (NOS) Multi-organ failure Septic Shock Cardiogenic shock Acute on Chronic BiVentricular Heart faliure  Pulmonary Hypertension Chronic Atrial Fibrillation  Acute on Chronic Renal Failure  Hyponatremia Liver Cirrhosis  Hypothyroidism    Discharge summary    11/27: Presented with generalized weakness, hypotension; Known to have ascites. Admitted w/ working dx of septic shock w/ presumed SBP. Cultures sent. Antibiotics initiated. Received Fluid bolus and Started on Levophed infusion.  11/28: Did have a paracentesis performed-removal of 5 L of yellow fluid Acute on chronic kidney disease.  12/1 still hypotensive, still on Levophed 12/2 still on pressors. Not weaned much but have been shooting for higher than baseline BP. He has now decided on DNR status but would like to cont Pressors as long as we can so that he can have more time w/ his family  12/3: Pressor requirements diminished. Renal function worse. Coming to terms that he had terminal illness.   He has requested we not go back up on pressors and is requesting de-escalation of some of his medications. Antibiotics stopped.  12/4 off levophed 12/5 decided he would like to go home w/ hospice.   Discharge Plan by Active Problems   Multi-organ failure d/t sepsis/septic shock superimposed on advanced BiVentricular heart failure Plan Home w/ hospice.  Consults:  Cardiology Radiology Renal  Procedures:  Paracentesis 08/21/2018 Central line placement 08/21/2018  Significant Diagnostic Tests:   Micro Data:  Ascitic fluid 08/20/2018: neg Blood cultures 08/20/2018: neg  Antimicrobials:  Zosyn 11/27>> 12/3 Vancomycin  11/27 >>11/28 Linezolid 11/29>>12/2    Discharge Exam: Blood Pressure  (Abnormal) 73/47   Pulse 92   Temperature (Abnormal) 97 F (36.1 C) (Axillary)   Respiration 13   Height 5\' 8"  (1.727 m)   Weight 79.1 kg   Oxygen Saturation 93%   Body Mass Index 26.51 kg/m   General exam: In no acute distress. Respiratory system: Good air movement and clear to auscultation. Cardiovascular system: S1 & S2 heard, RRR. No JVD. Gastrointestinal system: Abdomen is nondistended, soft, distended with positive fluid wave Central nervous system: Alert and oriented. No focal neurological deficits. Extremities: 3+ edema Skin: multiple bruises in arms bilaterally Psychiatry: Judgement and insight appear normal. Mood & affect appropriate.  Labs at discharge   Lab Results  Component Value Date   CREATININE 3.51 (H) 08/26/2018   BUN 75 (H) 08/26/2018   NA 124 (L) 08/26/2018   K 3.6 08/26/2018   CL 86 (L) 08/26/2018   CO2 24 08/26/2018   Lab Results  Component Value Date   WBC 24.4 (H) 08/22/2018   HGB 11.4 (L) 08/22/2018   HCT 35.6 (L) 08/22/2018   MCV 98.6 08/22/2018   PLT 188 08/22/2018   Lab Results  Component Value Date   ALT 22 08/22/2018   AST 34 08/22/2018   ALKPHOS 105 08/22/2018   BILITOT 2.7 (H) 08/22/2018   Lab Results  Component Value Date   INR 1.21 07/22/2018   INR 1.44 07/11/2018   INR 1.17 01/02/2018    Current radiological studies    No results found.  Disposition:      Discharge Instructions    Diet - low sodium heart healthy   Complete by:  As directed    Increase activity slowly   Complete by:  As directed  Allergies as of 08/28/2018    Allergen Reactions Comment   Spironolactone Itching       Medication List    Stop taking these medications   allopurinol 300 MG tablet Commonly known as:  ZYLOPRIM   apixaban 5 MG Tabs tablet Commonly known as:  ELIQUIS   cephALEXin 250 MG capsule Commonly known as:  KEFLEX   eplerenone 25 MG tablet Commonly known as:  INSPRA   levothyroxine 125 MCG tablet Commonly  known as:  SYNTHROID, LEVOTHROID   metolazone 5 MG tablet Commonly known as:  ZAROXOLYN   midodrine 10 MG tablet Commonly known as:  PROAMATINE   Potassium Chloride 40 MEQ/15ML (20%) Soln   pravastatin 40 MG tablet Commonly known as:  PRAVACHOL   sodium bicarbonate 650 MG tablet   torsemide 20 MG tablet Commonly known as:  DEMADEX   Vitamin D (Ergocalciferol) 1.25 MG (50000 UT) Caps capsule Commonly known as:  DRISDOL     Take these medications   collagenase ointment Commonly known as:  SANTYL Apply topically daily.   metoCLOPramide 5 MG/ML injection Commonly known as:  REGLAN Inject 1 mL (5 mg total) into the vein every 8 (eight) hours as needed.   mouth rinse Liqd solution 15 mLs by Mouth Rinse route 2 (two) times daily.   nitroGLYCERIN 0.4 MG SL tablet Commonly known as:  NITROSTAT Place 1 tablet (0.4 mg total) under the tongue every 5 (five) minutes as needed for chest pain.   ondansetron 4 MG tablet Commonly known as:  ZOFRAN Take 1 tablet (4 mg total) by mouth every 6 (six) hours as needed for nausea.   pantoprazole 40 MG tablet Commonly known as:  PROTONIX Take 1 tablet (40 mg total) by mouth daily. What changed:  when to take this   senna-docusate 8.6-50 MG tablet Commonly known as:  Senokot-S Take 1 tablet by mouth at bedtime as needed for mild constipation.   traMADol 50 MG tablet Commonly known as:  ULTRAM Take 2 tablets (100 mg total) by mouth every 12 (twelve) hours.        Follow-up appointment   NA Discharge Condition:    terminally ill; going home w/ hospice   Physician Statement:   The Patient was personally examined, the discharge assessment and plan has been personally reviewed and I agree with ACNP Doloros Kwolek's assessment and plan. 32 minutes of time have been dedicated to discharge assessment, planning and discharge instructions.   Signed: Clementeen Graham 08/29/2018, 12:41 PM

## 2018-09-02 ENCOUNTER — Inpatient Hospital Stay (HOSPITAL_COMMUNITY): Payer: BLUE CROSS/BLUE SHIELD

## 2018-09-03 DIAGNOSIS — I255 Ischemic cardiomyopathy: Secondary | ICD-10-CM | POA: Diagnosis not present

## 2018-09-03 DIAGNOSIS — I5042 Chronic combined systolic (congestive) and diastolic (congestive) heart failure: Secondary | ICD-10-CM | POA: Diagnosis not present

## 2018-09-03 DIAGNOSIS — Z515 Encounter for palliative care: Secondary | ICD-10-CM | POA: Diagnosis not present

## 2018-09-03 DIAGNOSIS — I5082 Biventricular heart failure: Secondary | ICD-10-CM | POA: Diagnosis not present

## 2018-09-04 DIAGNOSIS — I2510 Atherosclerotic heart disease of native coronary artery without angina pectoris: Secondary | ICD-10-CM | POA: Diagnosis not present

## 2018-09-04 DIAGNOSIS — I5042 Chronic combined systolic (congestive) and diastolic (congestive) heart failure: Secondary | ICD-10-CM | POA: Diagnosis not present

## 2018-09-04 DIAGNOSIS — I5082 Biventricular heart failure: Secondary | ICD-10-CM | POA: Diagnosis not present

## 2018-09-04 DIAGNOSIS — K761 Chronic passive congestion of liver: Secondary | ICD-10-CM | POA: Diagnosis not present

## 2018-09-04 DIAGNOSIS — N183 Chronic kidney disease, stage 3 (moderate): Secondary | ICD-10-CM | POA: Diagnosis not present

## 2018-09-04 DIAGNOSIS — I272 Pulmonary hypertension, unspecified: Secondary | ICD-10-CM | POA: Diagnosis not present

## 2018-09-04 DIAGNOSIS — I255 Ischemic cardiomyopathy: Secondary | ICD-10-CM | POA: Diagnosis not present

## 2018-09-05 DIAGNOSIS — K761 Chronic passive congestion of liver: Secondary | ICD-10-CM | POA: Diagnosis not present

## 2018-09-05 DIAGNOSIS — I5042 Chronic combined systolic (congestive) and diastolic (congestive) heart failure: Secondary | ICD-10-CM | POA: Diagnosis not present

## 2018-09-05 DIAGNOSIS — I5082 Biventricular heart failure: Secondary | ICD-10-CM | POA: Diagnosis not present

## 2018-09-05 DIAGNOSIS — I2510 Atherosclerotic heart disease of native coronary artery without angina pectoris: Secondary | ICD-10-CM | POA: Diagnosis not present

## 2018-09-05 DIAGNOSIS — I272 Pulmonary hypertension, unspecified: Secondary | ICD-10-CM | POA: Diagnosis not present

## 2018-09-05 DIAGNOSIS — I255 Ischemic cardiomyopathy: Secondary | ICD-10-CM | POA: Diagnosis not present

## 2018-09-05 DIAGNOSIS — N183 Chronic kidney disease, stage 3 (moderate): Secondary | ICD-10-CM | POA: Diagnosis not present

## 2018-09-16 ENCOUNTER — Telehealth (HOSPITAL_COMMUNITY): Payer: Self-pay

## 2018-09-16 NOTE — Telephone Encounter (Signed)
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  Unable to reach patient and unable to leave a message due to voicemail being full.

## 2018-09-24 DEATH — deceased

## 2018-09-26 LAB — FUNGUS CULTURE WITH STAIN

## 2018-09-26 LAB — FUNGUS CULTURE RESULT

## 2018-09-26 LAB — FUNGAL ORGANISM REFLEX

## 2018-11-15 IMAGING — NM NM TUMOR LOCAL/TRACER DISTR WITH SPECT
3 series · 13 of 13 positions shown · non-contrast
Comparison: none

CLINICAL DATA: HEART FAILURE. CONCERN FOR CARDIAC AMYLOIDOSIS.

EXAM:
NUCLEAR MEDICINE TUMOR LOCALIZATION. PYP CARDIAC AMYLOIDOSIS SCAN
WITH SPECT
TECHNIQUE: Following intravenous administration of radiopharmaceutical,
anterior planar images of the chest were obtained. Regions of
interest were placed on the heart and contralateral chest wall for
quantitative assessment. Additional SPECT imaging of the chest was
obtained.
RADIOPHARMACEUTICALS:  22 mCi TECHNETIUM 99 PYROPHOSPHATE

[Series 1: anterior · 4.14mm/px · 1 of 1 slices shown]
[im 1/1]
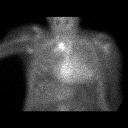

[Series 1: spect - (id)_(id)_cor · 2.1mm · 2.07mm/px · 6 of 256 frames shown]
[frame 22/256]
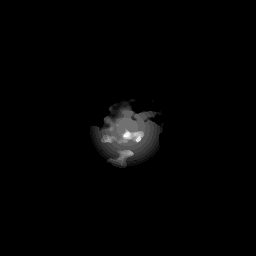
[frame 64/256]
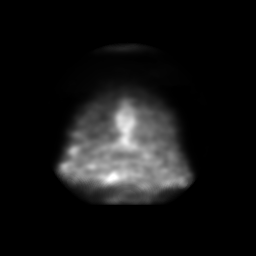
[frame 107/256]
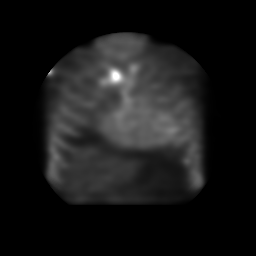
[frame 150/256]
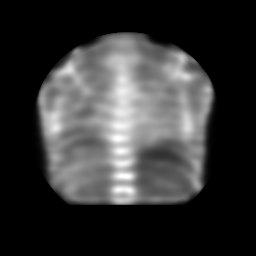
[frame 192/256]
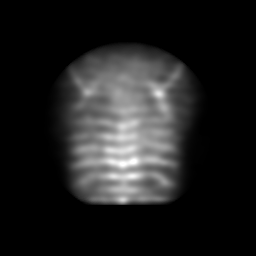
[frame 235/256]
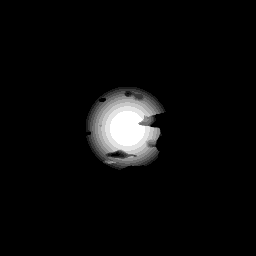

[Series 1: spect - (id)_(id)_tra · 2.1mm · 2.07mm/px · 6 of 256 frames shown]
[frame 22/256]
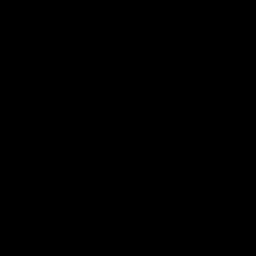
[frame 64/256]
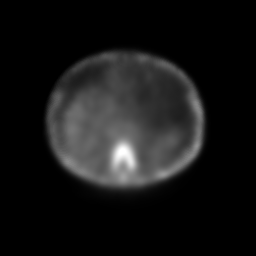
[frame 107/256]
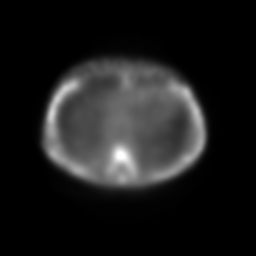
[frame 150/256]
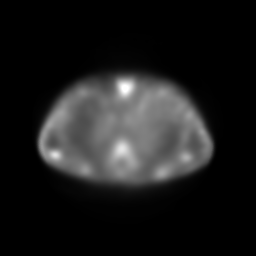
[frame 192/256]
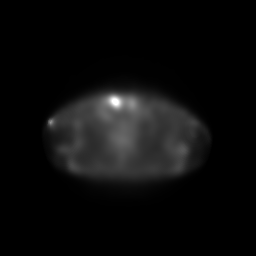
[frame 235/256]
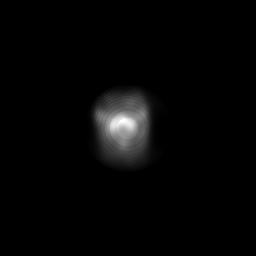

[13 of 13 positions shown; findings below may reference images not displayed]

FINDINGS: Planar Visual assessment:

Anterior planar imaging demonstrates radiotracer uptake within the
heart less than than uptake within the adjacent ribs (Grade 1).

Quantitative assessment :

Quantitative assessment of the cardiac uptake compared to the
contralateral chest wall is equal to 1.45 (H/CL = 1.45).

SPECT assessment: SPECT imaging of the chest demonstrates mild to
moderate radiotracer accumulation within the LEFT ventricle.
IMPRESSION: Visual and quantitative assessment (grade 1, H/CLL equal 1.45) are
borderline values but concerning for transthyretin amyloidosis.
